# Patient Record
Sex: Female | Born: 1965
Health system: Southern US, Community
[De-identification: ages and names within clinical notes are randomized; demographics above are authoritative.]

## PROBLEM LIST (undated history)

## (undated) DIAGNOSIS — R42 Dizziness and giddiness: Secondary | ICD-10-CM

## (undated) DIAGNOSIS — E785 Hyperlipidemia, unspecified: Secondary | ICD-10-CM

## (undated) DIAGNOSIS — J449 Chronic obstructive pulmonary disease, unspecified: Secondary | ICD-10-CM

## (undated) DIAGNOSIS — K219 Gastro-esophageal reflux disease without esophagitis: Secondary | ICD-10-CM

## (undated) DIAGNOSIS — I10 Essential (primary) hypertension: Secondary | ICD-10-CM

## (undated) HISTORY — PX: CHOLECYSTECTOMY: SHX55

## (undated) HISTORY — DX: Hyperlipidemia, unspecified: E78.5

## (undated) HISTORY — PX: TOTAL ABDOMINAL HYSTERECTOMY: SHX209

## (undated) NOTE — *Deleted (*Deleted)
Preventive Care 40-64 Years Old, Female Preventive care refers to visits with your health care provider and lifestyle choices that can promote health and wellness. This includes:  A yearly physical exam. This may also be called an annual well check.  Regular dental visits and eye exams.  Immunizations.  Screening for certain conditions.  Healthy lifestyle choices, such as eating a healthy diet, getting regular exercise, not using drugs or products that contain nicotine and tobacco, and limiting alcohol use. What can I expect for my preventive care visit? Physical exam Your health care provider will check your:  Height and weight. This may be used to calculate body mass index (BMI), which tells if you are at a healthy weight.  Heart rate and blood pressure.  Skin for abnormal spots. Counseling Your health care provider may ask you questions about your:  Alcohol, tobacco, and drug use.  Emotional well-being.  Home and relationship well-being.  Sexual activity.  Eating habits.  Work and work environment.  Method of birth control.  Menstrual cycle.  Pregnancy history. What immunizations do I need?  Influenza (flu) vaccine  This is recommended every year. Tetanus, diphtheria, and pertussis (Tdap) vaccine  You may need a Td booster every 10 years. Varicella (chickenpox) vaccine  You may need this if you have not been vaccinated. Zoster (shingles) vaccine  You may need this after age 60. Measles, mumps, and rubella (MMR) vaccine  You may need at least one dose of MMR if you were born in 1957 or later. You may also need a second dose. Pneumococcal conjugate (PCV13) vaccine  You may need this if you have certain conditions and were not previously vaccinated. Pneumococcal polysaccharide (PPSV23) vaccine  You may need one or two doses if you smoke cigarettes or if you have certain conditions. Meningococcal conjugate (MenACWY) vaccine  You may need this if you  have certain conditions. Hepatitis A vaccine  You may need this if you have certain conditions or if you travel or work in places where you may be exposed to hepatitis A. Hepatitis B vaccine  You may need this if you have certain conditions or if you travel or work in places where you may be exposed to hepatitis B. Haemophilus influenzae type b (Hib) vaccine  You may need this if you have certain conditions. Human papillomavirus (HPV) vaccine  If recommended by your health care provider, you may need three doses over 6 months. You may receive vaccines as individual doses or as more than one vaccine together in one shot (combination vaccines). Talk with your health care provider about the risks and benefits of combination vaccines. What tests do I need? Blood tests  Lipid and cholesterol levels. These may be checked every 5 years, or more frequently if you are over 50 years old.  Hepatitis C test.  Hepatitis B test. Screening  Lung cancer screening. You may have this screening every year starting at age 55 if you have a 30-pack-year history of smoking and currently smoke or have quit within the past 15 years.  Colorectal cancer screening. All adults should have this screening starting at age 50 and continuing until age 75. Your health care provider may recommend screening at age 45 if you are at increased risk. You will have tests every 1-10 years, depending on your results and the type of screening test.  Diabetes screening. This is done by checking your blood sugar (glucose) after you have not eaten for a while (fasting). You may have this   done every 1-3 years.  Mammogram. This may be done every 1-2 years. Talk with your health care provider about when you should start having regular mammograms. This may depend on whether you have a family history of breast cancer.  BRCA-related cancer screening. This may be done if you have a family history of breast, ovarian, tubal, or peritoneal  cancers.  Pelvic exam and Pap test. This may be done every 3 years starting at age 21. Starting at age 30, this may be done every 5 years if you have a Pap test in combination with an HPV test. Other tests  Sexually transmitted disease (STD) testing.  Bone density scan. This is done to screen for osteoporosis. You may have this scan if you are at high risk for osteoporosis. Follow these instructions at home: Eating and drinking  Eat a diet that includes fresh fruits and vegetables, whole grains, lean protein, and low-fat dairy.  Take vitamin and mineral supplements as recommended by your health care provider.  Do not drink alcohol if: ? Your health care provider tells you not to drink. ? You are pregnant, may be pregnant, or are planning to become pregnant.  If you drink alcohol: ? Limit how much you have to 0-1 drink a day. ? Be aware of how much alcohol is in your drink. In the [place] one drink equals one 12 oz bottle of beer (355 mL), one 5 oz glass of wine (148 mL), or one 1 oz glass of hard liquor (44 mL). Lifestyle  Take daily care of your teeth and gums.  Stay active. Exercise for at least 30 minutes on 5 or more days each week.  Do not use any products that contain nicotine or tobacco, such as cigarettes, e-cigarettes, and chewing tobacco. If you need help quitting, ask your health care provider.  If you are sexually active, practice safe sex. Use a condom or other form of birth control (contraception) in order to prevent pregnancy and STIs (sexually transmitted infections).  If told by your health care provider, take low-dose aspirin daily starting at age 50. What's next?  Visit your health care provider once a year for a well check visit.  Ask your health care provider how often you should have your eyes and teeth checked.  Stay up to date on all vaccines. This information is not intended to replace advice given to you by your health care provider. Make sure you  discuss any questions you have with your health care provider. Document Revised: 07/24/2018 Document Reviewed: 07/24/2018 Elsevier Patient Education  2020 Elsevier Inc.  

## (undated) NOTE — *Deleted (*Deleted)
Name: Joan Davis   MRN: [id number]    DOB: Jun 26, 1966   Date:09/12/2020       Progress Note  Subjective  Chief Complaint  Annual Physical Exam  HPI  Patient presents for annual CPE.  Diet: *** Exercise: ***     Office Visit from 03/31/2020 in [facility]  AUDIT-C Score 0     Depression: Phq 9 is  {Desc; negative/positive:13464::"negative"} Depression screen [facility] 2/9 03/31/2020 03/09/2020 11/11/2019 09/28/2019 09/21/2019  Decreased Interest 3 2 0 0 0  Down, Depressed, Hopeless 1 0 0 0 0  PHQ - 2 Score 4 2 0 0 0  Altered sleeping 1 1 0 0 0  Tired, decreased energy 3 2 0 0 0  Change in appetite 2 0 0 0 0  Feeling bad or failure about yourself  0 0 0 0 0  Trouble concentrating 2 1 0 0 0  Moving slowly or fidgety/restless 0 0 0 0 0  Suicidal thoughts 0 0 0 0 0  PHQ-9 Score 12 6 0 0 0  Difficult doing work/chores Somewhat difficult Not difficult at all Not difficult at all Not difficult at all Not difficult at all  Some recent data might be hidden   Hypertension: BP Readings from Last 3 Encounters:  05/11/20 111/72  03/31/20 120/82  03/09/20 128/82   Obesity: Wt Readings from Last 3 Encounters:  05/11/20 269 lb 6 oz (122.2 kg)  03/31/20 259 lb 6.4 oz (117.7 kg)  03/09/20 257 lb 3.2 oz (116.7 kg)   BMI Readings from Last 3 Encounters:  05/11/20 40.36 kg/m  03/31/20 39.44 kg/m  03/09/20 38.54 kg/m     Vaccines:  HPV: up to at age 29 , ask insurance if age between 6-45  Shingrix: 76-64 yo and ask insurance if covered when patient above 69 yo Pneumonia: *** educated and discussed with patient. Flu: N/A, educated and discussed with patient.  Hep C Screening: Ordered at today's visit  STD testing and prevention (HIV/chl/gon/syphilis): *** Intimate partner violence:*** Sexual History : Menstrual History/LMP/Abnormal Bleeding:  Incontinence Symptoms:   Breast cancer:  - Last Mammogram: 12/25/2011 - BRCA gene screening: N/A  Osteoporosis:  Discussed high calcium and vitamin D supplementation, weight bearing exercises  Cervical cancer screening: N/A  Skin cancer: Discussed monitoring for atypical lesions  Colorectal cancer: 03/15/2020   Lung cancer:  *** Low Dose CT Chest recommended if Age 52-80 years, 20 pack-year currently smoking OR have quit w/in 15years. Patient does not qualify.   ECG: 05/11/2020  Advanced Care Planning: A voluntary discussion about advance care planning including the explanation and discussion of advance directives.  Discussed health care proxy and Living will, and the patient was able to identify a health care proxy as ***.  Patient does not have a living will at present time. If patient does have living will, I have requested they bring this to the clinic to be scanned in to their chart.  Lipids: Lab Results  Component Value Date   CHOL 202 (H) 03/09/2020   CHOL 198 01/14/2019   CHOL 200 (H) 07/25/2018   Lab Results  Component Value Date   HDL 44 (L) 03/09/2020   HDL 56 01/14/2019   HDL 49 (L) 07/25/2018   Lab Results  Component Value Date   LDLCALC 136 (H) 03/09/2020   LDLCALC 117 (H) 01/14/2019   LDLCALC 130 (H) 07/25/2018   Lab Results  Component Value Date   TRIG 112 03/09/2020   TRIG 140 01/14/2019  TRIG 99 07/25/2018   Lab Results  Component Value Date   CHOLHDL 4.6 03/09/2020   CHOLHDL 3.5 01/14/2019   CHOLHDL 4.1 07/25/2018   No results found for: LDLDIRECT  Glucose: Glucose  Date Value Ref Range Status  07/25/2013 102 (H) 65 - 99 mg/dL Final   Glucose, Bld  Date Value Ref Range Status  03/09/2020 86 65 - 99 mg/dL Final    Comment:    .            Fasting reference interval .   09/21/2019 98 70 - 99 mg/dL Final  16/08/9603 87 65 - 99 mg/dL Final    Comment:    .            Fasting reference interval .    Glucose-Capillary  Date Value Ref Range Status  08/19/2019 85 70 - 99 mg/dL Final  54/07/8118 95 65 - 99 mg/dL Final    Patient Active Problem  List   Diagnosis Date Noted  . Palpitations 05/13/2020  . Chest pain of uncertain etiology 05/13/2020  . H/O arthroscopy of right knee 01/14/2019  . Class 2 severe obesity due to excess calories with serious comorbidity and body mass index (BMI) of 36.0 to 36.9 in adult (HCC) 01/14/2019  . Morbid obesity (HCC) 01/14/2019  . Mood swings 01/14/2019  . Hot flushes, perimenopausal 01/14/2019  . Tobacco use disorder 01/14/2019  . Gastroesophageal reflux disease without esophagitis 01/14/2019  . Mixed hyperlipidemia 07/29/2018  . Dizziness 02/06/2018  . COPD (chronic obstructive pulmonary disease) (HCC) 12/20/2017  . Hypertension 12/20/2017  . Food impaction of esophagus     Past Surgical History:  Procedure Laterality Date  . CHOLECYSTECTOMY    . CHONDROPLASTY Right 12/09/2018   Procedure: CHONDROPLASTY LATERAL FEMORAL CONDYLE;  Surgeon: Juanell Fairly, MD;  Location: [facility] ORS;  Service: Orthopedics;  Laterality: Right;  . ESOPHAGOGASTRODUODENOSCOPY (EGD) WITH PROPOFOL N/A 08/06/2019   Procedure: ESOPHAGOGASTRODUODENOSCOPY (EGD) WITH PROPOFOL;  Surgeon: Wyline Mood, MD;  Location[facility] ENDOSCOPY;  Service: Gastroenterology;  Laterality: N/A;  . FOREIGN BODY REMOVAL N/A 07/15/2016   Procedure: FOREIGN BODY REMOVAL;  Surgeon: Midge Minium, MD;  Location: [facility] ENDOSCOPY;  Service: Endoscopy;  Laterality: N/A;  . KNEE ARTHROSCOPY Right 12/09/2018   Procedure: ARTHROSCOPY KNEE WITH PARTIAL LATERAL MENISCECTOMY;  Surgeon: Juanell Fairly, MD;  Location: [facility] ORS;  Service: Orthopedics;  Laterality: Right;  . TOTAL ABDOMINAL HYSTERECTOMY      Family History  Problem Relation Age of Onset  . Alcohol abuse Mother   . Heart disease Father 80  . Hypertension Father   . Hypertension Brother   . Hyperlipidemia Brother   . Heart disease Brother 34  . Asthma Maternal Grandmother   . Stroke Maternal Grandfather   . Hypertension Maternal Grandfather   . Congestive Heart Failure Paternal  Grandmother   . Heart disease Paternal Grandfather   . Congestive Heart Failure Paternal Grandfather     Social History   Socioeconomic History  . Marital status: Single    Spouse name: Not on file  . Number of children: 3  . Years of education: Not on file  . Highest education level: Associate degree: occupational, Scientist, product/process development, or vocational program  Occupational History  . Occupation: CNA  Tobacco Use  . Smoking status: Current Every Day Smoker    Packs/day: 0.50    Years: 31.00    Pack years: 15.50    Types: Cigarettes    Start date: 03/09/1988  . Smokeless tobacco: Never Used  Vaping Use  . Vaping Use: Never used  Substance and Sexual Activity  . Alcohol use: No  . Drug use: No  . Sexual activity: Yes  Other Topics Concern  . Not on file  Social History Narrative   Lives with father ( takes care of him) older son also at home    Works full time as Lawyer   Social Determinants of Corporate investment banker Strain:   . Difficulty of Paying Living Expenses: Not on file  Food Insecurity:   . Worried About Programme researcher, broadcasting/film/video in the Last Year: Not on file  . Ran Out of Food in the Last Year: Not on file  Transportation Needs:   . Lack of Transportation (Medical): Not on file  . Lack of Transportation (Non-Medical): Not on file  Physical Activity:   . Days of Exercise per Week: Not on file  . Minutes of Exercise per Session: Not on file  Stress:   . Feeling of Stress : Not on file  Social Connections:   . Frequency of Communication with Friends and Family: Not on file  . Frequency of Social Gatherings with Friends and Family: Not on file  . Attends Religious Services: Not on file  . Active Member of Clubs or Organizations: Not on file  . Attends Banker Meetings: Not on file  . Marital Status: Not on file  Intimate Partner Violence:   . Fear of Current or Ex-Partner: Not on file  . Emotionally Abused: Not on file  . Physically Abused: Not on file  .  Sexually Abused: Not on file     Current Outpatient Medications:  .  albuterol (VENTOLIN HFA) 108 (90 Base) MCG/ACT inhaler, Inhale 2 puffs into the lungs every 6 (six) hours as needed for wheezing or shortness of breath., Disp: 18 g, Rfl: 2 .  budesonide-formoterol (SYMBICORT) 160-4.5 MCG/ACT inhaler, Inhale 2 puffs into the lungs 2 (two) times daily., Disp: 1 Inhaler, Rfl: 2 .  Cyanocobalamin (VITAMIN B-12 PO), Take by mouth daily., Disp: , Rfl:  .  fluticasone (FLONASE) 50 MCG/ACT nasal spray, Place 2 sprays into both nostrils daily., Disp: 16 g, Rfl: 6 .  hydrochlorothiazide (HYDRODIURIL) 25 MG tablet, Take 25 mg by mouth daily., Disp: , Rfl:  .  loratadine (CLARITIN) 10 MG tablet, Take 10 mg by mouth daily., Disp: , Rfl:  .  meclizine (ANTIVERT) 25 MG tablet, Take 0.5-1 tablets (12.5-25 mg total) by mouth 2 (two) times daily as needed for dizziness., Disp: 100 tablet, Rfl: 0 .  Multiple Vitamin (MULTIVITAMIN) capsule, Take 1 capsule by mouth daily., Disp: , Rfl:  .  omeprazole (PRILOSEC) 40 MG capsule, Take 1 capsule (40 mg total) by mouth daily., Disp: 90 capsule, Rfl: 1 .  rosuvastatin (CRESTOR) 10 MG tablet, Take 1 tablet (10 mg total) by mouth daily., Disp: 90 tablet, Rfl: 1 .  triamterene-hydrochlorothiazide (MAXZIDE-25) 37.5-25 MG tablet, Take 1 tablet by mouth daily., Disp: 90 tablet, Rfl: 0 .  VITAMIN D PO, Take by mouth daily., Disp: , Rfl:   No Known Allergies   ROS  ***  Objective  There were no vitals filed for this visit.  There is no height or weight on file to calculate BMI.  Physical Exam ***  Recent Results (from the past 2160 hour(s))  ECHOCARDIOGRAM COMPLETE     Status: None   Collection Time: 07/18/20  2:54 PM  Result Value Ref Range   S' Lateral 3.90 cm  Area-P 1/2 1.60 cm2    Diabetic Foot Exam: Diabetic Foot Exam - Simple   No data filed     ***  Fall Risk: Fall Risk  03/31/2020 03/09/2020 11/11/2019 09/28/2019 09/21/2019  Falls in the past  year? 0 0 0 0 0  Number falls in past yr: 0 0 0 0 0  Injury with Fall? 0 0 0 0 0  Risk for fall due to : - - - - -  Follow up - - - Falls evaluation completed Falls evaluation completed   ***  Functional Status Survey:   ***  Assessment & Plan  There are no diagnoses linked to this encounter.  -USPSTF grade A and B recommendations reviewed with patient; age-appropriate recommendations, preventive care, screening tests, etc discussed and encouraged; healthy living encouraged; see AVS for patient education given to patient -Discussed importance of 150 minutes of physical activity weekly, eat two servings of fish weekly, eat one serving of tree nuts ( cashews, pistachios, pecans, almonds.Marland Kitchen) every other day, eat 6 servings of fruit/vegetables daily and drink plenty of water and avoid sweet beverages.   -Reviewed Health Maintenance: ***

---

## 2006-07-06 ENCOUNTER — Emergency Department: Payer: Self-pay | Admitting: Emergency Medicine

## 2006-07-08 ENCOUNTER — Emergency Department: Payer: Self-pay | Admitting: Emergency Medicine

## 2006-09-14 ENCOUNTER — Emergency Department: Payer: Self-pay | Admitting: Emergency Medicine

## 2007-03-16 ENCOUNTER — Emergency Department: Payer: Self-pay | Admitting: Emergency Medicine

## 2007-08-01 ENCOUNTER — Emergency Department: Payer: Self-pay | Admitting: Emergency Medicine

## 2007-09-01 ENCOUNTER — Emergency Department: Payer: Self-pay | Admitting: Emergency Medicine

## 2007-11-15 ENCOUNTER — Emergency Department: Payer: Self-pay | Admitting: Unknown Physician Specialty

## 2007-12-03 ENCOUNTER — Encounter: Payer: Self-pay | Admitting: Family Medicine

## 2007-12-28 ENCOUNTER — Encounter: Payer: Self-pay | Admitting: Family Medicine

## 2008-03-01 ENCOUNTER — Ambulatory Visit: Payer: Self-pay | Admitting: Orthopedic Surgery

## 2008-12-27 ENCOUNTER — Emergency Department: Payer: Self-pay | Admitting: Emergency Medicine

## 2011-02-07 ENCOUNTER — Emergency Department: Payer: Self-pay | Admitting: Emergency Medicine

## 2011-06-17 ENCOUNTER — Emergency Department: Payer: Self-pay | Admitting: Emergency Medicine

## 2011-06-24 ENCOUNTER — Emergency Department: Payer: Self-pay | Admitting: Emergency Medicine

## 2011-12-25 ENCOUNTER — Ambulatory Visit: Payer: Self-pay

## 2012-05-25 ENCOUNTER — Emergency Department: Payer: Self-pay | Admitting: Internal Medicine

## 2013-07-25 ENCOUNTER — Emergency Department: Payer: Self-pay | Admitting: Emergency Medicine

## 2013-07-25 LAB — CBC
HCT: 38 % (ref 35.0–47.0)
HGB: 12.9 g/dL (ref 12.0–16.0)
MCHC: 33.8 g/dL (ref 32.0–36.0)
MCV: 94 fL (ref 80–100)
Platelet: 328 10*3/uL (ref 150–440)
RBC: 4.07 10*6/uL (ref 3.80–5.20)
RDW: 13.8 % (ref 11.5–14.5)

## 2013-07-25 LAB — BASIC METABOLIC PANEL
Anion Gap: 6 — ABNORMAL LOW (ref 7–16)
Chloride: 109 mmol/L — ABNORMAL HIGH (ref 98–107)
EGFR (African American): 53 — ABNORMAL LOW
EGFR (Non-African Amer.): 46 — ABNORMAL LOW
Osmolality: 283 (ref 275–301)

## 2013-07-25 LAB — TROPONIN I: Troponin-I: <0.02 ng/mL

## 2013-07-25 LAB — CK TOTAL AND CKMB (NOT AT ARMC): CK-MB: 0.6 ng/mL (ref 0.5–3.6)

## 2016-01-28 ENCOUNTER — Emergency Department: Payer: No Typology Code available for payment source

## 2016-01-28 DIAGNOSIS — S8001XA Contusion of right knee, initial encounter: Secondary | ICD-10-CM | POA: Diagnosis not present

## 2016-01-28 DIAGNOSIS — Y9389 Activity, other specified: Secondary | ICD-10-CM | POA: Insufficient documentation

## 2016-01-28 DIAGNOSIS — Y92410 Unspecified street and highway as the place of occurrence of the external cause: Secondary | ICD-10-CM | POA: Insufficient documentation

## 2016-01-28 DIAGNOSIS — I10 Essential (primary) hypertension: Secondary | ICD-10-CM | POA: Diagnosis not present

## 2016-01-28 DIAGNOSIS — Y998 Other external cause status: Secondary | ICD-10-CM | POA: Diagnosis not present

## 2016-01-28 DIAGNOSIS — S8991XA Unspecified injury of right lower leg, initial encounter: Secondary | ICD-10-CM | POA: Diagnosis present

## 2016-01-28 DIAGNOSIS — Z79899 Other long term (current) drug therapy: Secondary | ICD-10-CM | POA: Insufficient documentation

## 2016-01-28 DIAGNOSIS — F17210 Nicotine dependence, cigarettes, uncomplicated: Secondary | ICD-10-CM | POA: Insufficient documentation
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
## 2016-01-29 ENCOUNTER — Emergency Department
Admission: EM | Admit: 2016-01-29 | Discharge: 2016-01-29 | Disposition: A | Discharge status: Home / Self Care | Payer: No Typology Code available for payment source | Attending: Emergency Medicine | Admitting: Emergency Medicine

## 2016-01-29 ENCOUNTER — Encounter: Payer: Self-pay | Admitting: Emergency Medicine

## 2016-01-29 DIAGNOSIS — S8001XA Contusion of right knee, initial encounter: Secondary | ICD-10-CM

## 2016-01-29 HISTORY — DX: Essential (primary) hypertension: I10

## 2016-01-29 MED ORDER — DOCUSATE SODIUM 100 MG PO CAPS: ORAL_CAPSULE | ORAL | Status: DC

## 2016-01-29 MED ORDER — HYDROCODONE-ACETAMINOPHEN 5-325 MG PO TABS
2.0000 | ORAL_TABLET | Freq: Once | ORAL | Status: AC
  Administered 2016-01-29: 2 via ORAL
  Filled 2016-01-29: qty 2

## 2016-01-29 MED ORDER — HYDROCODONE-ACETAMINOPHEN 5-325 MG PO TABS: 1.0000 | ORAL_TABLET | ORAL | Status: DC | PRN
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
## 2016-01-29 NOTE — ED Notes (Signed)
Dr. Forbach at bedside.  
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
## 2016-07-15 ENCOUNTER — Emergency Department: Payer: Self-pay

## 2016-07-15 ENCOUNTER — Encounter: Payer: Self-pay | Admitting: Emergency Medicine

## 2016-07-15 ENCOUNTER — Emergency Department: Payer: Self-pay | Admitting: Anesthesiology

## 2016-07-15 ENCOUNTER — Encounter
Admission: EM | Disposition: A | Discharge status: Home / Self Care | Payer: Self-pay | Source: Home / Self Care | Attending: Emergency Medicine

## 2016-07-15 ENCOUNTER — Emergency Department
Admission: EM | Admit: 2016-07-15 | Discharge: 2016-07-15 | Disposition: A | Discharge status: Home / Self Care | Payer: Self-pay | Attending: Emergency Medicine | Admitting: Emergency Medicine

## 2016-07-15 DIAGNOSIS — X58XXXA Exposure to other specified factors, initial encounter: Secondary | ICD-10-CM | POA: Insufficient documentation

## 2016-07-15 DIAGNOSIS — K222 Esophageal obstruction: Secondary | ICD-10-CM

## 2016-07-15 DIAGNOSIS — J449 Chronic obstructive pulmonary disease, unspecified: Secondary | ICD-10-CM | POA: Insufficient documentation

## 2016-07-15 DIAGNOSIS — T18128A Food in esophagus causing other injury, initial encounter: Secondary | ICD-10-CM

## 2016-07-15 DIAGNOSIS — F17210 Nicotine dependence, cigarettes, uncomplicated: Secondary | ICD-10-CM | POA: Insufficient documentation

## 2016-07-15 DIAGNOSIS — Z79891 Long term (current) use of opiate analgesic: Secondary | ICD-10-CM | POA: Insufficient documentation

## 2016-07-15 DIAGNOSIS — Z79899 Other long term (current) drug therapy: Secondary | ICD-10-CM | POA: Insufficient documentation

## 2016-07-15 DIAGNOSIS — I10 Essential (primary) hypertension: Secondary | ICD-10-CM | POA: Insufficient documentation

## 2016-07-15 DIAGNOSIS — W44F3XA Food entering into or through a natural orifice, initial encounter: Secondary | ICD-10-CM

## 2016-07-15 HISTORY — PX: FOREIGN BODY REMOVAL: SHX962

## 2016-07-15 HISTORY — DX: Chronic obstructive pulmonary disease, unspecified: J44.9

## 2016-07-15 LAB — BASIC METABOLIC PANEL
ANION GAP: 6 (ref 5–15)
BUN: 18 mg/dL (ref 6–20)
CALCIUM: 9.2 mg/dL (ref 8.9–10.3)
CO2: 26 mmol/L (ref 22–32)
Chloride: 113 mmol/L — ABNORMAL HIGH (ref 101–111)
Creatinine, Ser: 0.75 mg/dL (ref 0.44–1.00)
Glucose, Bld: 104 mg/dL — ABNORMAL HIGH (ref 65–99)
Potassium: 3.3 mmol/L — ABNORMAL LOW (ref 3.5–5.1)
SODIUM: 145 mmol/L (ref 135–145)

## 2016-07-15 LAB — CBC
HCT: 43.2 % (ref 35.0–47.0)
Hemoglobin: 14.6 g/dL (ref 12.0–16.0)
MCH: 32.1 pg (ref 26.0–34.0)
MCHC: 33.8 g/dL (ref 32.0–36.0)
MCV: 94.9 fL (ref 80.0–100.0)
PLATELETS: 271 10*3/uL (ref 150–440)
RBC: 4.55 MIL/uL (ref 3.80–5.20)
RDW: 14.1 % (ref 11.5–14.5)
WBC: 6.2 10*3/uL (ref 3.6–11.0)

## 2016-07-15 LAB — PROTIME-INR
INR: 0.96
PROTHROMBIN TIME: 12.8 s (ref 11.4–15.2)

## 2016-07-15 SURGERY — REMOVAL, FOREIGN BODY
Anesthesia: General

## 2016-07-15 MED ORDER — MIDAZOLAM HCL 2 MG/2ML IJ SOLN
INTRAMUSCULAR | Status: DC | PRN
  Administered 2016-07-15: 1 mg via INTRAVENOUS

## 2016-07-15 MED ORDER — FENTANYL CITRATE (PF) 100 MCG/2ML IJ SOLN: 25.0000 ug | INTRAMUSCULAR | Status: DC | PRN

## 2016-07-15 MED ORDER — MORPHINE SULFATE (PF) 4 MG/ML IV SOLN
4.0000 mg | Freq: Once | INTRAVENOUS | Status: AC
  Administered 2016-07-15: 4 mg via INTRAVENOUS
  Filled 2016-07-15: qty 1

## 2016-07-15 MED ORDER — PROPOFOL 10 MG/ML IV BOLUS
INTRAVENOUS | Status: DC | PRN
  Administered 2016-07-15: 150 mg via INTRAVENOUS

## 2016-07-15 MED ORDER — ONDANSETRON HCL 4 MG/2ML IJ SOLN: 4.0000 mg | Freq: Once | INTRAMUSCULAR | Status: DC | PRN

## 2016-07-15 MED ORDER — ONDANSETRON HCL 4 MG/2ML IJ SOLN
4.0000 mg | Freq: Once | INTRAMUSCULAR | Status: AC
  Administered 2016-07-15: 4 mg via INTRAVENOUS
  Filled 2016-07-15: qty 2

## 2016-07-15 MED ORDER — FENTANYL CITRATE (PF) 100 MCG/2ML IJ SOLN
INTRAMUSCULAR | Status: DC | PRN
Start: 2016-07-15 — End: 2016-07-15
  Administered 2016-07-15: 50 ug via INTRAVENOUS

## 2016-07-15 MED ORDER — SUCCINYLCHOLINE CHLORIDE 20 MG/ML IJ SOLN
INTRAMUSCULAR | Status: DC | PRN
  Administered 2016-07-15: 100 mg via INTRAVENOUS

## 2016-07-15 MED ORDER — GLUCAGON HCL (RDNA) 1 MG IJ SOLR
1.0000 mg | Freq: Once | INTRAMUSCULAR | Status: AC
  Administered 2016-07-15: 1 mg via INTRAVENOUS
  Filled 2016-07-15: qty 1

## 2016-07-15 MED ORDER — ONDANSETRON HCL 4 MG/2ML IJ SOLN
INTRAMUSCULAR | Status: AC
  Administered 2016-07-15: 4 mg via INTRAVENOUS
  Filled 2016-07-15: qty 2

## 2016-07-15 MED ORDER — LACTATED RINGERS IV SOLN
INTRAVENOUS | Status: DC | PRN
  Administered 2016-07-15: 19:00:00 via INTRAVENOUS

## 2016-07-15 MED ORDER — ONDANSETRON HCL 4 MG/2ML IJ SOLN
4.0000 mg | Freq: Once | INTRAMUSCULAR | Status: AC
  Administered 2016-07-15: 4 mg via INTRAVENOUS

## 2016-07-15 MED ORDER — IPRATROPIUM-ALBUTEROL 0.5-2.5 (3) MG/3ML IN SOLN
3.0000 mL | Freq: Once | RESPIRATORY_TRACT | Status: AC
  Administered 2016-07-15: 3 mL via RESPIRATORY_TRACT

## 2016-07-15 NOTE — Brief Op Note (Signed)
Pt intubated successfully by J.Rise Patience, CRNA

## 2016-07-15 NOTE — Anesthesia Postprocedure Evaluation (Signed)
Anesthesia Post Note  Patient: Joan Davis  Procedure(s) Performed: Procedure(s) (LRB): FOREIGN BODY REMOVAL (N/A)  Patient location during evaluation: Other Anesthesia Type: General Level of consciousness: awake and alert Pain management: pain level controlled Vital Signs Assessment: post-procedure vital signs reviewed and stable Respiratory status: spontaneous breathing, nonlabored ventilation, respiratory function stable and patient connected to nasal cannula oxygen Cardiovascular status: blood pressure returned to baseline and stable Postop Assessment: no signs of nausea or vomiting Anesthetic complications: no    Last Vitals:  Vitals:   07/15/16 1927 07/15/16 1929  BP: 126/75 126/75  Pulse: 95   Resp: (!) 22 20  Temp: 36.4 C     Last Pain:  Vitals:   07/15/16 1605  TempSrc: Oral                 Sheyla Zaffino S

## 2016-07-15 NOTE — ED Triage Notes (Signed)
Patient brought in by [facility] from home, patient was eating steak when she felt a piece lodge in her throat, patient tried to drink water to get the steak to go down however she could not get it down. Patient unable to swallow her own spit at this time.

## 2016-07-15 NOTE — Anesthesia Preprocedure Evaluation (Signed)
Anesthesia Evaluation  Patient identified by MRN, date of birth, ID band Patient awake    Reviewed: Allergy & Precautions, NPO status , Patient's Chart, lab work & pertinent test results, reviewed documented beta blocker date and time   Airway Mallampati: III  TM Distance: >3 FB     Dental  (+) Chipped   Pulmonary COPD, Current Smoker,           Cardiovascular hypertension, Pt. on medications      Neuro/Psych    GI/Hepatic   Endo/Other    Renal/GU      Musculoskeletal   Abdominal   Peds  Hematology   Anesthesia Other Findings Obese.  Reproductive/Obstetrics                             Anesthesia Physical Anesthesia Plan  ASA: III  Anesthesia Plan: General   Post-op Pain Management:    Induction: Intravenous  Airway Management Planned: Oral ETT  Additional Equipment:   Intra-op Plan:   Post-operative Plan:   Informed Consent: I have reviewed the patients History and Physical, chart, labs and discussed the procedure including the risks, benefits and alternatives for the proposed anesthesia with the patient or authorized representative who has indicated his/her understanding and acceptance.     Plan Discussed with: CRNA  Anesthesia Plan Comments:         Anesthesia Quick Evaluation

## 2016-07-15 NOTE — H&P (Signed)
  Lucilla Lame, MD [facility] [address] [place], [place] [address] Phone: [phone] Fax : [phone]  Primary Care Physician:  [facility] Primary Gastroenterologist:  Dr. Allen Norris  Pre-Procedure History & Physical: HPI:  Joan Davis is a 50 y.o. female is here for an endoscopy.   Past Medical History:  Diagnosis Date  . COPD (chronic obstructive pulmonary disease) ([place])   . Hypertension     History reviewed. No pertinent surgical history.  Prior to Admission medications   Medication Sig Start Date End Date Taking? Authorizing Provider  docusate sodium (COLACE) 100 MG capsule Take 1 tablet once or twice daily as needed for constipation while taking narcotic pain medicine 01/29/16   Hinda Kehr, MD  hydrochlorothiazide (HYDRODIURIL) 25 MG tablet Take 25 mg by mouth daily.    Historical Provider, MD  HYDROcodone-acetaminophen (NORCO/VICODIN) 5-325 MG tablet Take 1-2 tablets by mouth every 4 (four) hours as needed for moderate pain. 01/29/16   Hinda Kehr, MD    Allergies as of 07/15/2016  . (No Known Allergies)    History reviewed. No pertinent family history.  Social History   Social History  . Marital status: Single    Spouse name: N/A  . Number of children: N/A  . Years of education: N/A   Occupational History  . Not on file.   Social History Main Topics  . Smoking status: Current Every Day Smoker    Packs/day: 0.50    Types: Cigarettes  . Smokeless tobacco: Never Used  . Alcohol use No  . Drug use: No  . Sexual activity: Not on file   Other Topics Concern  . Not on file   Social History Narrative  . No narrative on file    Review of Systems: See HPI, otherwise negative ROS  Physical Exam: BP 131/71   Pulse 66   Temp 98.6 F (37 C) (Oral)   Resp 20   Wt 275 lb 4.8 oz (124.9 kg)   SpO2 100%   BMI 38.40 kg/m  General:   Alert,  pleasant and cooperative in NAD Head:  Normocephalic and atraumatic. Neck:  Supple; no  masses or thyromegaly. Lungs:  Clear throughout to auscultation.    Heart:  Regular rate and rhythm. Abdomen:  Soft, nontender and nondistended. Normal bowel sounds, without guarding, and without rebound.   Neurologic:  Alert and  oriented x4;  grossly normal neurologically.  Impression/Plan: Joan Davis is here for an endoscopy to be performed for food bolus    Risks, benefits, limitations, and alternatives regarding  endoscopy have been reviewed with the patient.  Questions have been answered.  All parties agreeable.   Lucilla Lame, MD  07/15/2016, 6:59 PM

## 2016-07-15 NOTE — Anesthesia Procedure Notes (Signed)
Procedure Name: Intubation Date/Time: 07/15/2016 7:13 PM Performed by: Jonna Clark Pre-anesthesia Checklist: Patient identified, Patient being monitored, Timeout performed, Emergency Drugs available and Suction available Patient Re-evaluated:Patient Re-evaluated prior to inductionOxygen Delivery Method: Circle system utilized Preoxygenation: Pre-oxygenation with 100% oxygen Intubation Type: IV induction and Rapid sequence Ventilation: Mask ventilation without difficulty Laryngoscope Size: Mac and 3 Grade View: Grade I Tube type: Oral Tube size: 7.0 mm Number of attempts: 1 Placement Confirmation: ETT inserted through vocal cords under direct vision,  positive ETCO2 and breath sounds checked- equal and bilateral Secured at: 21 cm Tube secured with: Tape Dental Injury: Teeth and Oropharynx as per pre-operative assessment

## 2016-07-15 NOTE — ED Provider Notes (Signed)
[facility] Emergency Department Provider Note  ____________________________________________  I have reviewed the triage vital signs and the nursing notes.   HISTORY  Chief Complaint Choking   HPI Joan Davis is a 50 y.o. female presents after "choking on steak".  Patient was eating a piece of steak about 30 minutes ago when she felt that gets stuck. She is having a sensation described as a stretching or nauseated feeling but not really "pain" which she is experiencing in the middle of her neck pointing to approximately the area of her larynx.  Denies any previous issues with this. Reports she is otherwise healthy. No allergies to any medications. She reports she is not able to keep any saliva down and she is having to spit everything up. She denies any trouble breathing, but reports when it first happened she felt like she was choking and having difficulty breathing but this is no longer the case.Does have a history of COPD but denies any issues with that. No chest pain.  Past Medical History:  Diagnosis Date  . COPD (chronic obstructive pulmonary disease) ([place])   . Hypertension     Patient Active Problem List   Diagnosis Date Noted  . Food impaction of esophagus     History reviewed. No pertinent surgical history.  Prior to Admission medications   Medication Sig Start Date End Date Taking? Authorizing Provider  docusate sodium (COLACE) 100 MG capsule Take 1 tablet once or twice daily as needed for constipation while taking narcotic pain medicine 01/29/16   Hinda Kehr, MD  hydrochlorothiazide (HYDRODIURIL) 25 MG tablet Take 25 mg by mouth daily.    Historical Provider, MD  HYDROcodone-acetaminophen (NORCO/VICODIN) 5-325 MG tablet Take 1-2 tablets by mouth every 4 (four) hours as needed for moderate pain. 01/29/16   Hinda Kehr, MD    Allergies Review of patient's allergies indicates no known allergies.  History reviewed. No pertinent family  history.  Social History Social History  Substance Use Topics  . Smoking status: Current Every Day Smoker    Packs/day: 0.50    Types: Cigarettes  . Smokeless tobacco: Never Used  . Alcohol use No    Review of Systems Constitutional: No fever/chills. Eyes: No visual changes. ENT: See history of present illness Cardiovascular: Denies chest pain. Respiratory: Denies shortness of breath. Gastrointestinal: No abdominal pain.  No diarrhea.  No constipation. Genitourinary: Negative for dysuria. Musculoskeletal: Negative for back pain. Skin: Negative for rash. Neurological: Negative for headaches, focal weakness or numbness.  10-point ROS otherwise negative.  ____________________________________________   PHYSICAL EXAM:  VITAL SIGNS: ED Triage Vitals [07/15/16 1605]  Enc Vitals Group     BP 135/76     Pulse Rate 76     Resp 20     Temp 98.6 F (37 C)     Temp Source Oral     SpO2 98 %     Weight 275 lb 4.8 oz (124.9 kg)     Height      Head Circumference      Peak Flow      Pain Score      Pain Loc      Pain Edu?      Excl. in [place]?     Constitutional: Alert and oriented. Well appearing and in no acute distress.Does appear uncomfortable, spitting up secretions into an emesis bag. Airway totally intact with normal voice and speech. Eyes: Conjunctivae are normal. PERRL. EOMI. Head: Atraumatic. Nose: No congestion/rhinnorhea. Mouth/Throat: Mucous membranes  are moist.  Oropharynx non-erythematous. No visualizable foreign body. Neck: No stridor.   Cardiovascular: Normal rate, regular rhythm. Grossly normal heart sounds.  Good peripheral circulation. Respiratory: Normal respiratory effort.  No retractions. Lungs CTAB. Gastrointestinal: Soft and nontender. No distention.  Musculoskeletal: No lower extremity tenderness nor edema.  No joint effusions. Neurologic:  Normal speech and language. No gross focal neurologic deficits are appreciated. Skin:  Skin is warm, dry and  intact. No rash noted. Psychiatric: Mood and affect are normal. Speech and behavior are normal.  ____________________________________________   LABS (all labs ordered are listed, but only abnormal results are displayed)  Labs Reviewed  BASIC METABOLIC PANEL - Abnormal; Notable for the following:       Result Value   Potassium 3.3 (*)    Chloride 113 (*)    Glucose, Bld 104 (*)    All other components within normal limits  CBC  PROTIME-INR   ____________________________________________  EKG   ____________________________________________  RADIOLOGY  Dg Neck Soft Tissue  Result Date: 07/15/2016 CLINICAL DATA:  Meet stuck in throat. EXAM: NECK SOFT TISSUES - 1+ VIEW COMPARISON:  None. FINDINGS: No abnormal soft tissue shadows or gas shadows. Ordinary spondylosis C5-6 and C6-7. IMPRESSION: No significant plain radiographic finding. Electronically Signed   By: Nelson Chimes M.D.   On: 07/15/2016 16:58   Dg Chest 2 View  Result Date: 07/15/2016 CLINICAL DATA:  Meet stuck in throat. EXAM: CHEST  2 VIEW COMPARISON:  07/25/2013 FINDINGS: Heart size is normal. Mediastinal shadows are normal. Slightly chronic interstitial lung markings again demonstrated without evidence of active infiltrate, mass, effusion or collapse. No significant bone finding. IMPRESSION: No acute finding relating to the clinical presentation. Chronic interstitial lung markings. Electronically Signed   By: Nelson Chimes M.D.   On: 07/15/2016 16:59    ____________________________________________   PROCEDURES  Procedure(s) performed: None  Procedures  Critical Care performed: No  ____________________________________________   INITIAL IMPRESSION / ASSESSMENT AND PLAN / ED COURSE  Pertinent labs & imaging results that were available during my care of the patient were reviewed by me and considered in my medical decision making (see chart for details).  Patient presents with globus sensation, spitting out  saliva. No evidence of obvious airway involvement however concern that she may have an esophageal foreign body given she is unable to swallow saliva.  No cardiac or pulmonary symptoms.  Clinical Course  Comment By Time  Patient resting, reports ongoing discomfort in the neck area, still unable to swallow secretions. We'll attempt medical management with glucagon and morphine now. If no improvement thereafter patient will require GI consultation. Delman Kitten, MD 08/20 [phone]  Patient still unable to pass. Discussed with gastroenterology, coming to see patient in consult now. Delman Kitten, MD 08/20 1758     ____________________________________________   FINAL CLINICAL IMPRESSION(S) / ED DIAGNOSES  Final diagnoses:  Esophageal obstruction due to food impaction      NEW MEDICATIONS STARTED DURING THIS VISIT:  Current Discharge Medication List       Note:  This document was prepared using Dragon voice recognition software and may include unintentional dictation errors.     Delman Kitten, MD 07/15/16 [phone]

## 2016-07-15 NOTE — Transfer of Care (Signed)
Immediate Anesthesia Transfer of Care Note  Patient: MEGAHN SHORTY  Procedure(s) Performed: Procedure(s): FOREIGN BODY REMOVAL (N/A)  Patient Location: PACU  Anesthesia Type:General  Level of Consciousness: awake, alert  and oriented  Airway & Oxygen Therapy: Patient Spontanous Breathing and Patient connected to nasal cannula oxygen  Post-op Assessment: Report given to RN and Post -op Vital signs reviewed and stable  Post vital signs: Reviewed and stable  Last Vitals:  Vitals:   07/15/16 1927 07/15/16 1929  BP: 126/75 126/75  Pulse: 95   Resp: (!) 22 20  Temp: 36.4 C     Last Pain:  Vitals:   07/15/16 1605  TempSrc: Oral         Complications: No apparent anesthesia complications

## 2016-07-16 ENCOUNTER — Encounter: Payer: Self-pay | Admitting: Gastroenterology

## 2016-07-16 NOTE — Op Note (Signed)
[facility] Gastroenterology Patient Name: Joan Davis Procedure Date: 07/15/2016 7:00 PM MRN: [id number] Account #: [id number] Date of Birth: 03-07-1966 Admit Type: Outpatient Age: 50 Room: [facility] ENDO ROOM 4 Gender: Female Note Status: Finalized Procedure:            Upper GI endoscopy Indications:          Foreign body in the esophagus Providers:            Lucilla Lame MD, MD Referring MD:         No Local Md, MD (Referring MD) Medicines:            General Anesthesia Complications:        No immediate complications. Procedure:            Pre-Anesthesia Assessment:                       - Prior to the procedure, a History and Physical was                        performed, and patient medications and allergies were                        reviewed. The patient's tolerance of previous                        anesthesia was also reviewed. The risks and benefits of                        the procedure and the sedation options and risks were                        discussed with the patient. All questions were                        answered, and informed consent was obtained. Prior                        Anticoagulants: The patient has taken no previous                        anticoagulant or antiplatelet agents. ASA Grade                        Assessment: II - A patient with mild systemic disease.                        After reviewing the risks and benefits, the patient was                        deemed in satisfactory condition to undergo the                        procedure.                       After obtaining informed consent, the endoscope was                        passed under direct vision. Throughout the procedure,  the patient's blood pressure, pulse, and oxygen                        saturations were monitored continuously. The Endoscope                        was introduced through the mouth, and advanced to the             second part of duodenum. The upper GI endoscopy was                        accomplished without difficulty. The patient tolerated                        the procedure well. Findings:      Food was found in the lower third of the esophagus. Removal of food was       accomplished.      Food (residue) was found in the entire examined stomach.      The examined duodenum was normal. Impression:           - Food in the lower third of the esophagus. Removal was                        successful.                       - Food (residue) in the stomach.                       - Normal examined duodenum. Recommendation:       - Repeat upper endoscopy in 6 weeks. Procedure Code(s):    --- Professional ---                       [phone], Esophagogastroduodenoscopy, flexible, transoral;                        with removal of foreign body(s) Diagnosis Code(s):    --- Professional ---                       [id number], Food in esophagus causing other injury,                        initial encounter                       T18.108A, Unspecified foreign body in esophagus causing                        other injury, initial encounter CPT copyright 2016 American Medical Association. All rights reserved. The codes documented in this report are preliminary and upon coder review may  be revised to meet current compliance requirements. Lucilla Lame MD, MD 07/15/2016 7:19:12 PM This report has been signed electronically. Number of Addenda: 0 Note Initiated On: 07/15/2016 7:00 PM      [facility]

## 2017-08-16 ENCOUNTER — Encounter: Payer: Self-pay | Admitting: Emergency Medicine

## 2017-08-16 ENCOUNTER — Emergency Department
Admission: EM | Admit: 2017-08-16 | Discharge: 2017-08-16 | Disposition: A | Discharge status: Home / Self Care | Payer: Self-pay | Attending: Emergency Medicine | Admitting: Emergency Medicine

## 2017-08-16 DIAGNOSIS — Z79899 Other long term (current) drug therapy: Secondary | ICD-10-CM | POA: Insufficient documentation

## 2017-08-16 DIAGNOSIS — F17210 Nicotine dependence, cigarettes, uncomplicated: Secondary | ICD-10-CM | POA: Insufficient documentation

## 2017-08-16 DIAGNOSIS — J449 Chronic obstructive pulmonary disease, unspecified: Secondary | ICD-10-CM | POA: Insufficient documentation

## 2017-08-16 DIAGNOSIS — L918 Other hypertrophic disorders of the skin: Secondary | ICD-10-CM | POA: Insufficient documentation

## 2017-08-16 DIAGNOSIS — I10 Essential (primary) hypertension: Secondary | ICD-10-CM | POA: Insufficient documentation

## 2017-08-16 MED ORDER — CEPHALEXIN 500 MG PO CAPS: 500.0000 mg | ORAL_CAPSULE | Freq: Three times a day (TID) | ORAL | 0 refills | Status: DC

## 2017-08-16 MED ORDER — LIDOCAINE-EPINEPHRINE 2 %-1:100000 IJ SOLN
1.7000 mL | Freq: Once | INTRAMUSCULAR | Status: AC
  Administered 2017-08-16: 1.7 mL
  Filled 2017-08-16: qty 1.7

## 2017-08-16 NOTE — ED Triage Notes (Signed)
Has skin tag.  Her np said to tie a string around it.  Now it looks infected.  It is in groin area

## 2017-08-16 NOTE — ED Notes (Signed)
States she developed a skin tag  Near her vaginal area   Was seen by her PCP  But thinks area in now infected

## 2017-08-16 NOTE — Discharge Instructions (Signed)
You have a large skin tag on the inner thigh. You may continue with your plan to strangulate the tag with ligation (string). Keep the area clean, dry, and covered. It may take a few weeks for the skin tag to dry up and fall off. Follow-up with your provider for interim wound checks. Return to the ED for any signs of infection. You may start the antibiotic if signs of infection occur.

## 2017-08-18 NOTE — ED Provider Notes (Signed)
[facility] Emergency Department Provider Note ____________________________________________  Time seen: 1048  I have reviewed the triage vital signs and the nursing notes.  HISTORY  Chief Complaint  Cellulitis  HPI Joan Davis is a 51 y.o. female Results to the ED for evaluation of a skin tag she has to her thigh, that she believes may be infected. Patient describes her nurse practitioner at her clinic told her that she could safely limited to skin tag by tiny piece of string around. The patient did so about 2 days prior. Since that time she noted some dark spontaneous bleeding to the skin tag on yesterday. Today she describes the skin tag looking somewhat infected. She denies any fevers, chills, sweats. She does note some local tenderness to the skin tag.  Past Medical History:  Diagnosis Date  . COPD (chronic obstructive pulmonary disease) ([place])   . Hypertension     Patient Active Problem List   Diagnosis Date Noted  . Food impaction of esophagus     Past Surgical History:  Procedure Laterality Date  . FOREIGN BODY REMOVAL N/A 07/15/2016   Procedure: FOREIGN BODY REMOVAL;  Surgeon: Lucilla Lame, MD;  Location: [facility] ENDOSCOPY;  Service: Endoscopy;  Laterality: N/A;    Prior to Admission medications   Medication Sig Start Date End Date Taking? Authorizing Provider  budesonide-formoterol (SYMBICORT) 160-4.5 MCG/ACT inhaler Inhale 2 puffs into the lungs 2 (two) times daily.   Yes [provider]  hydrochlorothiazide (HYDRODIURIL) 25 MG tablet Take 25 mg by mouth daily.   Yes [provider]  cephALEXin (KEFLEX) 500 MG capsule Take 1 capsule (500 mg total) by mouth 3 (three) times daily. 08/16/17   Demeisha Geraghty, Dannielle Karvonen, PA-C  docusate sodium (COLACE) 100 MG capsule Take 1 tablet once or twice daily as needed for constipation while taking narcotic pain medicine 01/29/16   Hinda Kehr, MD  hydrochlorothiazide (HYDRODIURIL) 25 MG tablet  Take 25 mg by mouth daily.    [provider]  HYDROcodone-acetaminophen (NORCO/VICODIN) 5-325 MG tablet Take 1-2 tablets by mouth every 4 (four) hours as needed for moderate pain. 01/29/16   Hinda Kehr, MD    Allergies Patient has no known allergies.  No family history on file.  Social History Social History  Substance Use Topics  . Smoking status: Current Every Day Smoker    Packs/day: 0.50    Types: Cigarettes  . Smokeless tobacco: Never Used  . Alcohol use No    Review of Systems  Constitutional: Negative for fever. Skin: Negative for rash. Right thigh skin tag Neurological: Negative for headaches, focal weakness or numbness. ____________________________________________  PHYSICAL EXAM:  VITAL SIGNS: ED Triage Vitals  Enc Vitals Group     BP 08/16/17 1028 (!) 143/79     Pulse Rate 08/16/17 1028 73     Resp 08/16/17 1028 18     Temp 08/16/17 1028 97.8 F (36.6 C)     Temp Source 08/16/17 1028 Oral     SpO2 08/16/17 1028 98 %     Weight 08/16/17 1028 269 lb (122 kg)     Height 08/16/17 1028 5\' 11"  (1.803 m)     Head Circumference --      Peak Flow --      Pain Score 08/16/17 1021 4     Pain Loc --      Pain Edu? --      Excl. in [place]? --     Constitutional: Alert and oriented. Well appearing  and in no distress. Head: Normocephalic and atraumatic. Cardiovascular: . Normal distal pulses. Respiratory: Normal respiratory effort.  Musculoskeletal: Nontender with normal range of motion in all extremities.  Neurologic:  Normal gait without ataxia. Normal speech and language. No gross focal neurologic deficits are appreciated. Skin:  Skin is warm, dry and intact. No rash noted. Patient with a large dilated skin tag noted to the inner thigh at the groin. There appears to be a white string catheter at the base of the skin tag. The skin tag itself is without erythema, induration, warmth, or swelling. No active bleeding is noted. The surrounding skin at the base is  without erythema, induration, warmth. ____________________________________________  PROCEDURES  Skin prepped with chlorhexidine [place] w/ epi 2%-1:100000 1 ml intralesional injection Dry dressing applied ____________________________________________  INITIAL IMPRESSION / ASSESSMENT AND PLAN / ED COURSE  Patient with ED evaluation of a skin tag after she cut a string around it. The patient is advised to her presentation is not consistent with an infectio at this time She is given a local intralesional injection of lidocaine for relief. She is also discharged with a prescription for Keflex, if signs of infection do develop. She is given instructions on signs of a local skin infection and is encouraged to follow with the primary provider at [facility] clinic.she is also encouraged to keep the skin tag covered to avoid traumatic avulsion. ____________________________________________  FINAL CLINICAL IMPRESSION(S) / ED DIAGNOSES  Final diagnoses:  Skin tag  Inflamed skin tag      Melvenia Needles, PA-C 08/18/17 0009    Carrie Mew, MD 08/18/17 1615

## 2017-12-18 ENCOUNTER — Encounter: Payer: Self-pay | Admitting: Intensive Care

## 2017-12-18 ENCOUNTER — Emergency Department
Admission: EM | Admit: 2017-12-18 | Discharge: 2017-12-18 | Disposition: A | Discharge status: Home / Self Care | Payer: Self-pay | Attending: Emergency Medicine | Admitting: Emergency Medicine

## 2017-12-18 ENCOUNTER — Emergency Department: Payer: Self-pay

## 2017-12-18 DIAGNOSIS — J449 Chronic obstructive pulmonary disease, unspecified: Secondary | ICD-10-CM | POA: Insufficient documentation

## 2017-12-18 DIAGNOSIS — M25562 Pain in left knee: Secondary | ICD-10-CM | POA: Insufficient documentation

## 2017-12-18 DIAGNOSIS — F17210 Nicotine dependence, cigarettes, uncomplicated: Secondary | ICD-10-CM | POA: Insufficient documentation

## 2017-12-18 DIAGNOSIS — I10 Essential (primary) hypertension: Secondary | ICD-10-CM | POA: Insufficient documentation

## 2017-12-18 DIAGNOSIS — Z79899 Other long term (current) drug therapy: Secondary | ICD-10-CM | POA: Insufficient documentation

## 2017-12-18 MED ORDER — MELOXICAM 15 MG PO TABS: 15.0000 mg | ORAL_TABLET | Freq: Every day | ORAL | 1 refills | Status: AC

## 2017-12-18 NOTE — ED Notes (Signed)
X-ray at bedside

## 2017-12-18 NOTE — ED Triage Notes (Signed)
Patient reports tripping and falling on a ramp X4 months ago. Never seen for knee pain. Reports about 2 weeks ago it started hurting and swelling up.

## 2017-12-18 NOTE — ED Provider Notes (Signed)
[facility] Emergency Department Provider Note  ____________________________________________  Time seen: Approximately 7:42 PM  I have reviewed the triage vital signs and the nursing notes.   HISTORY  Chief Complaint Knee Pain (left)    HPI Joan Davis is a 51 y.o. female presents to the emergency department with left knee pain.  Patient reports that she tripped approximately 4 months ago and experienced left knee pain.  Patient reports that her pain seemingly resolved but then returned approximately 2 weeks ago.  Patient describes 7 out of 10 left knee pain described as throbbing and "like a toothache".  Patient describes diffuse anterior knee pain worsened with ambulation and climbing stairs.  Patient denies recent travel, prolonged immobilization, recent surgery, shortness of breath and chest pain.   Past Medical History:  Diagnosis Date  . COPD (chronic obstructive pulmonary disease) ([place])   . Hypertension     Patient Active Problem List   Diagnosis Date Noted  . Food impaction of esophagus     Past Surgical History:  Procedure Laterality Date  . FOREIGN BODY REMOVAL N/A 07/15/2016   Procedure: FOREIGN BODY REMOVAL;  Surgeon: Lucilla Lame, MD;  Location: [facility] ENDOSCOPY;  Service: Endoscopy;  Laterality: N/A;    Prior to Admission medications   Medication Sig Start Date End Date Taking? Authorizing Provider  budesonide-formoterol (SYMBICORT) 160-4.5 MCG/ACT inhaler Inhale 2 puffs into the lungs 2 (two) times daily.    [provider]  cephALEXin (KEFLEX) 500 MG capsule Take 1 capsule (500 mg total) by mouth 3 (three) times daily. 08/16/17   Menshew, Dannielle Karvonen, PA-C  docusate sodium (COLACE) 100 MG capsule Take 1 tablet once or twice daily as needed for constipation while taking narcotic pain medicine 01/29/16   Hinda Kehr, MD  hydrochlorothiazide (HYDRODIURIL) 25 MG tablet Take 25 mg by mouth daily.    [provider]   hydrochlorothiazide (HYDRODIURIL) 25 MG tablet Take 25 mg by mouth daily.    [provider]  HYDROcodone-acetaminophen (NORCO/VICODIN) 5-325 MG tablet Take 1-2 tablets by mouth every 4 (four) hours as needed for moderate pain. 01/29/16   Hinda Kehr, MD  meloxicam (MOBIC) 15 MG tablet Take 1 tablet (15 mg total) by mouth daily for 7 days. 12/18/17 12/25/17  Lannie Fields, PA-C    Allergies Patient has no known allergies.  History reviewed. No pertinent family history.  Social History Social History   Tobacco Use  . Smoking status: Current Every Day Smoker    Packs/day: 0.50    Types: Cigarettes  . Smokeless tobacco: Never Used  Substance Use Topics  . Alcohol use: No  . Drug use: No     Review of Systems  Constitutional: No fever/chills Eyes: No visual changes. No discharge ENT: No upper respiratory complaints. Cardiovascular: no chest pain. Respiratory: no cough. No SOB. Musculoskeletal: Patient has left knee pain.  Skin: Negative for rash, abrasions, lacerations, ecchymosis. Neurological: Negative for headaches, focal weakness or numbness.   ____________________________________________   PHYSICAL EXAM:  VITAL SIGNS: ED Triage Vitals  Enc Vitals Group     BP 12/18/17 1811 (!) 163/88     Pulse Rate 12/18/17 1811 85     Resp 12/18/17 1811 16     Temp 12/18/17 1811 97.9 F (36.6 C)     Temp src --      SpO2 12/18/17 1811 100 %     Weight 12/18/17 1812 266 lb (120.7 kg)     Height 12/18/17 1812 5'  11" (1.803 m)     Head Circumference --      Peak Flow --      Pain Score 12/18/17 1812 7     Pain Loc --      Pain Edu? --      Excl. in [place]? --      Constitutional: Alert and oriented. Well appearing and in no acute distress. Eyes: Conjunctivae are normal. PERRL. EOMI. Head: Atraumatic. Cardiovascular: Normal rate, regular rhythm. Normal S1 and S2.  Good peripheral circulation. Respiratory: Normal respiratory effort without tachypnea or retractions.  Lungs CTAB. Good air entry to the bases with no decreased or absent breath sounds. Musculoskeletal: Patient has mild left knee edema.  No laxity with MCL or LCL testing.  Negative apprehension.  Mild ballottement.  Negative anterior and posterior drawer test.  Palpable dorsalis pedis pulse, left.  No pain was elicited with palpation of the calf.  No edema of the left ankle. Neurologic:  Normal speech and language. No gross focal neurologic deficits are appreciated.   ____________________________________________   LABS (all labs ordered are listed, but only abnormal results are displayed)  Labs Reviewed - No data to display ____________________________________________  EKG   ____________________________________________  RADIOLOGY Unk Pinto, personally viewed and evaluated these images (plain radiographs) as part of my medical decision making, as well as reviewing the written report by the radiologist.  Dg Knee Complete 4 Views Left  Result Date: 12/18/2017 CLINICAL DATA:  Knee pain after fall EXAM: LEFT KNEE - COMPLETE 4+ VIEW COMPARISON:  None. FINDINGS: No fracture or malalignment. Mild degenerative changes involving the medial compartment of the knee. Small knee effusion. IMPRESSION: No acute osseous abnormality.  Small knee effusion. Electronically Signed   By: Donavan Foil M.D.   On: 12/18/2017 18:32    ____________________________________________    PROCEDURES  Procedure(s) performed:    Procedures    Medications - No data to display   ____________________________________________   INITIAL IMPRESSION / ASSESSMENT AND PLAN / ED COURSE  Pertinent labs & imaging results that were available during my care of the patient were reviewed by me and considered in my medical decision making (see chart for details).  Review of the [place] CSRS was performed in accordance of the [place] prior to dispensing any controlled drugs.     Assessment and plan Left knee  pain Differential diagnosis includes arthritis, meniscal tear, chronic ACL tear and residual knee sprain.  Patient had mild edema of the left knee on physical exam with reproducible pain to palpation over the anterior knee.  X-ray examination reveals findings consistent with osteoarthritis.  Patient was discharged with meloxicam and advised to use ice nightly over her left knee.  Patient was referred to orthopedics.  Strict return precautions were given to return to the emergency department with new or worsening symptoms.  Vital signs are reassuring prior to discharge.  All patient questions were answered.    ____________________________________________  FINAL CLINICAL IMPRESSION(S) / ED DIAGNOSES  Final diagnoses:  Acute pain of left knee      NEW MEDICATIONS STARTED DURING THIS VISIT:  ED Discharge Orders        Ordered    meloxicam (MOBIC) 15 MG tablet  Daily     12/18/17 1938          This chart was dictated using voice recognition software/Dragon. Despite best efforts to proofread, errors can occur which can change the meaning. Any change was purely unintentional.    Vallarie Mare  Curt Jews 12/18/17 1946    Carrie Mew, MD 12/18/17 2330

## 2017-12-20 ENCOUNTER — Ambulatory Visit (INDEPENDENT_AMBULATORY_CARE_PROVIDER_SITE_OTHER): Payer: Self-pay | Admitting: Family Medicine

## 2017-12-20 ENCOUNTER — Encounter: Payer: Self-pay | Admitting: Family Medicine

## 2017-12-20 VITALS — BP 162/88 | HR 91 | Temp 98.6°F | Resp 16 | Ht 71.0 in | Wt 265.5 lb

## 2017-12-20 DIAGNOSIS — I10 Essential (primary) hypertension: Secondary | ICD-10-CM

## 2017-12-20 DIAGNOSIS — J449 Chronic obstructive pulmonary disease, unspecified: Secondary | ICD-10-CM

## 2017-12-20 DIAGNOSIS — Z7689 Persons encountering health services in other specified circumstances: Secondary | ICD-10-CM

## 2017-12-20 DIAGNOSIS — M1712 Unilateral primary osteoarthritis, left knee: Secondary | ICD-10-CM

## 2017-12-20 MED ORDER — HYDROCHLOROTHIAZIDE 25 MG PO TABS: 25.0000 mg | ORAL_TABLET | Freq: Every day | ORAL | 0 refills | Status: DC

## 2017-12-20 MED ORDER — BUDESONIDE-FORMOTEROL FUMARATE 160-4.5 MCG/ACT IN AERO: 2.0000 | INHALATION_SPRAY | Freq: Two times a day (BID) | RESPIRATORY_TRACT | 2 refills | Status: DC

## 2017-12-20 NOTE — Progress Notes (Signed)
Name: Joan Davis   MRN: [id number]    DOB: Mar 24, 1966   Date:12/20/2017       Progress Note  Subjective  Chief Complaint  Chief Complaint  Patient presents with  . Establish Care    elevated blood pressure seen in ER    HPI  Pt presents to establish care - she used to go to [facility]) - has not been seen in about 3 years.  She is currently awaiting her health insurance - started full time with [employer] full time and [employer].  HTN: Has had HTN most of her adult life.  She was taking HCTZ but has been off of the medication for 1.5 years - she had lost weight and her BP had come down.  She started noticing some headaches and dizziness, and some intermittently blurred vision 2 days ago.  She does get some mild BLE edema - elevates feet at night and this typically goes down.  She very much declines lab work today, would like to return after her insurance is in place.  Knee Pain: Has seen 12/18/17 in ER for LEFT knee pain - she was given Meloxicam which seems to be helping.  She has a compression sleeve that she wears when the pain really worsens. Xrays in the ER showed mild degenerative changes with no acute findings.  COPD: Current smoker - 3/4-1ppd x30 years. Denies shortness of breath, chest pain, or dyspnea on exertion.  Has intermittent congested cough.  Takes Symbicort as needed -discussed using daily. She does not have albuterol inhaler and does not feel like she needs one today.  Patient Active Problem List   Diagnosis Date Noted  . Food impaction of esophagus     Past Surgical History:  Procedure Laterality Date  . FOREIGN BODY REMOVAL N/A 07/15/2016   Procedure: FOREIGN BODY REMOVAL;  Surgeon: Lucilla Lame, MD;  Location: [facility] ENDOSCOPY;  Service: Endoscopy;  Laterality: N/A;    No family history on file.  Social History   Socioeconomic History  . Marital status: Single    Spouse name: Not on file  . Number of  children: Not on file  . Years of education: Not on file  . Highest education level: Not on file  Social Needs  . Financial resource strain: Not on file  . Food insecurity - worry: Not on file  . Food insecurity - inability: Not on file  . Transportation needs - medical: Not on file  . Transportation needs - non-medical: Not on file  Occupational History  . Not on file  Tobacco Use  . Smoking status: Current Every Day Smoker    Packs/day: 0.50    Types: Cigarettes  . Smokeless tobacco: Never Used  Substance and Sexual Activity  . Alcohol use: No  . Drug use: No  . Sexual activity: Not on file  Other Topics Concern  . Not on file  Social History Narrative  . Not on file     Current Outpatient Medications:  .  meloxicam (MOBIC) 15 MG tablet, Take 1 tablet (15 mg total) by mouth daily for 7 days., Disp: 30 tablet, Rfl: 1 .  budesonide-formoterol (SYMBICORT) 160-4.5 MCG/ACT inhaler, Inhale 2 puffs into the lungs 2 (two) times daily., Disp: , Rfl:  .  cephALEXin (KEFLEX) 500 MG capsule, Take 1 capsule (500 mg total) by mouth 3 (three) times daily. (Patient not taking: Reported on 12/20/2017), Disp: 21 capsule, Rfl: 0 .  docusate  sodium (COLACE) 100 MG capsule, Take 1 tablet once or twice daily as needed for constipation while taking narcotic pain medicine (Patient not taking: Reported on 12/20/2017), Disp: 30 capsule, Rfl: 0 .  hydrochlorothiazide (HYDRODIURIL) 25 MG tablet, Take 25 mg by mouth daily., Disp: , Rfl:  .  hydrochlorothiazide (HYDRODIURIL) 25 MG tablet, Take 25 mg by mouth daily., Disp: , Rfl:  .  HYDROcodone-acetaminophen (NORCO/VICODIN) 5-325 MG tablet, Take 1-2 tablets by mouth every 4 (four) hours as needed for moderate pain. (Patient not taking: Reported on 12/20/2017), Disp: 15 tablet, Rfl: 0  Not on File  ROS Constitutional: Negative for fever or weight change.  Respiratory:See HPI Cardiovascular: Negative for chest pain or palpitations.  Gastrointestinal:  Negative for abdominal pain, no bowel changes.  Musculoskeletal: Negative for gait problem or joint swelling. See HPI Skin: Negative for rash.  Neurological: See HPI No other specific complaints in a complete review of systems (except as listed in HPI above).  Objective  Vitals:   12/20/17 0803  BP: (!) 162/88  Pulse: 91  Resp: 16  Temp: 98.6 F (37 C)  TempSrc: Oral  SpO2: 97%  Weight: 265 lb 8 oz (120.4 kg)  Height: 5\' 11"  (1.803 m)   Body mass index is 37.03 kg/m.  Physical Exam Constitutional: Patient appears well-developed and well-nourished. No distress.  HENT: Head: Normocephalic and atraumatic. Eyes: Conjunctivae and EOM are normal. Pupils are equal, round, and reactive to light. No scleral icterus.  Cardiovascular: Normal rate, regular rhythm and normal heart sounds.  No murmur heard. No BLE edema. Pulmonary/Chest: Effort normal and breath sounds normal. No respiratory distress. Abdominal: Soft. Bowel sounds are normal, no distension. There is no tenderness. no masses Musculoskeletal: Normal range of motion, no joint effusions. No gross deformities Neurological: she is alert and oriented to person, place, and time. No cranial nerve deficit. Coordination, balance, strength, speech and gait are normal.  Skin: Skin is warm and dry. No rash noted. No erythema.  Psychiatric: Patient has a normal mood and affect. behavior is normal. Judgment and thought content normal.  No results found for this or any previous visit (from the past 72 hour(s)).  PHQ2/9: Depression screen PHQ 2/9 12/20/2017  Decreased Interest 0  Down, Depressed, Hopeless 0  PHQ - 2 Score 0   Fall Risk: Fall Risk  12/20/2017  Falls in the past year? No   Assessment & Plan  1. Essential hypertension - Discussed need for labs in the near future, pt is awaiting health insurance to start - will return ASAP after this to obtain labs. - hydrochlorothiazide (HYDRODIURIL) 25 MG tablet; Take 1 tablet (25 mg  total) by mouth daily.  Dispense: 30 tablet; Refill: 0  2. Chronic obstructive pulmonary disease, unspecified COPD type (HCC) - budesonide-formoterol (SYMBICORT) 160-4.5 MCG/ACT inhaler; Inhale 2 puffs into the lungs 2 (two) times daily.  Dispense: 1 Inhaler; Refill: 2  3. Arthritis of left knee - Continue Meloxicam PRN - May wear sleeve on knee PRN  4. Encounter to establish care - Schedule CPE w/ fasting labs ASAP after she obtains health insurance.  -Reviewed Health Maintenance: Declines today - will await her health insurance to start, will schedule physical

## 2017-12-20 NOTE — Patient Instructions (Signed)
DASH Eating Plan DASH stands for "Dietary Approaches to Stop Hypertension." The DASH eating plan is a healthy eating plan that has been shown to reduce high blood pressure (hypertension). It may also reduce your risk for type 2 diabetes, heart disease, and stroke. The DASH eating plan may also help with weight loss. What are tips for following this plan? General guidelines  Avoid eating more than 2,300 mg (milligrams) of salt (sodium) a day. If you have hypertension, you may need to reduce your sodium intake to 1,500 mg a day.  Limit alcohol intake to no more than 1 drink a day for nonpregnant women and 2 drinks a day for men. One drink equals 12 oz of beer, 5 oz of wine, or 1 oz of hard liquor.  Work with your health care provider to maintain a healthy body weight or to lose weight. Ask what an ideal weight is for you.  Get at least 30 minutes of exercise that causes your heart to beat faster (aerobic exercise) most days of the week. Activities may include walking, swimming, or biking.  Work with your health care provider or diet and nutrition specialist (dietitian) to adjust your eating plan to your individual calorie needs. Reading food labels  Check food labels for the amount of sodium per serving. Choose foods with less than 5 percent of the Daily Value of sodium. Generally, foods with less than 300 mg of sodium per serving fit into this eating plan.  To find whole grains, look for the word "whole" as the first word in the ingredient list. Shopping  Buy products labeled as "low-sodium" or "no salt added."  Buy fresh foods. Avoid canned foods and premade or frozen meals. Cooking  Avoid adding salt when cooking. Use salt-free seasonings or herbs instead of table salt or sea salt. Check with your health care provider or pharmacist before using salt substitutes.  Do not fry foods. Cook foods using healthy methods such as baking, boiling, grilling, and broiling instead.  Cook with  heart-healthy oils, such as olive, canola, soybean, or sunflower oil. Meal planning   Eat a balanced diet that includes: ? 5 or more servings of fruits and vegetables each day. At each meal, try to fill half of your plate with fruits and vegetables. ? Up to 6-8 servings of whole grains each day. ? Less than 6 oz of lean meat, poultry, or fish each day. A 3-oz serving of meat is about the same size as a deck of cards. One egg equals 1 oz. ? 2 servings of low-fat dairy each day. ? A serving of nuts, seeds, or beans 5 times each week. ? Heart-healthy fats. Healthy fats called Omega-3 fatty acids are found in foods such as flaxseeds and coldwater fish, like sardines, salmon, and mackerel.  Limit how much you eat of the following: ? Canned or prepackaged foods. ? Food that is high in trans fat, such as fried foods. ? Food that is high in saturated fat, such as fatty meat. ? Sweets, desserts, sugary drinks, and other foods with added sugar. ? Full-fat dairy products.  Do not salt foods before eating.  Try to eat at least 2 vegetarian meals each week.  Eat more home-cooked food and less restaurant, buffet, and fast food.  When eating at a restaurant, ask that your food be prepared with less salt or no salt, if possible. What foods are recommended? The items listed may not be a complete list. Talk with your dietitian about what   dietary choices are best for you. Grains Whole-grain or whole-wheat bread. Whole-grain or whole-wheat pasta. Brown rice. Oatmeal. Quinoa. Bulgur. Whole-grain and low-sodium cereals. Pita bread. Low-fat, low-sodium crackers. Whole-wheat flour tortillas. Vegetables Fresh or frozen vegetables (raw, steamed, roasted, or grilled). Low-sodium or reduced-sodium tomato and vegetable juice. Low-sodium or reduced-sodium tomato sauce and tomato paste. Low-sodium or reduced-sodium canned vegetables. Fruits All fresh, dried, or frozen fruit. Canned fruit in natural juice (without  added sugar). Meat and other protein foods Skinless chicken or turkey. Ground chicken or turkey. Pork with fat trimmed off. Fish and seafood. Egg whites. Dried beans, peas, or lentils. Unsalted nuts, nut butters, and seeds. Unsalted canned beans. Lean cuts of beef with fat trimmed off. Low-sodium, lean deli meat. Dairy Low-fat (1%) or fat-free (skim) milk. Fat-free, low-fat, or reduced-fat cheeses. Nonfat, low-sodium ricotta or cottage cheese. Low-fat or nonfat yogurt. Low-fat, low-sodium cheese. Fats and oils Soft margarine without trans fats. Vegetable oil. Low-fat, reduced-fat, or light mayonnaise and salad dressings (reduced-sodium). Canola, safflower, olive, soybean, and sunflower oils. Avocado. Seasoning and other foods Herbs. Spices. Seasoning mixes without salt. Unsalted popcorn and pretzels. Fat-free sweets. What foods are not recommended? The items listed may not be a complete list. Talk with your dietitian about what dietary choices are best for you. Grains Baked goods made with fat, such as croissants, muffins, or some breads. Dry pasta or rice meal packs. Vegetables Creamed or fried vegetables. Vegetables in a cheese sauce. Regular canned vegetables (not low-sodium or reduced-sodium). Regular canned tomato sauce and paste (not low-sodium or reduced-sodium). Regular tomato and vegetable juice (not low-sodium or reduced-sodium). Pickles. Olives. Fruits Canned fruit in a light or heavy syrup. Fried fruit. Fruit in cream or butter sauce. Meat and other protein foods Fatty cuts of meat. Ribs. Fried meat. Bacon. Sausage. Bologna and other processed lunch meats. Salami. Fatback. Hotdogs. Bratwurst. Salted nuts and seeds. Canned beans with added salt. Canned or smoked fish. Whole eggs or egg yolks. Chicken or turkey with skin. Dairy Whole or 2% milk, cream, and half-and-half. Whole or full-fat cream cheese. Whole-fat or sweetened yogurt. Full-fat cheese. Nondairy creamers. Whipped toppings.  Processed cheese and cheese spreads. Fats and oils Butter. Stick margarine. Lard. Shortening. Ghee. Bacon fat. Tropical oils, such as coconut, palm kernel, or palm oil. Seasoning and other foods Salted popcorn and pretzels. Onion salt, garlic salt, seasoned salt, table salt, and sea salt. Worcestershire sauce. Tartar sauce. Barbecue sauce. Teriyaki sauce. Soy sauce, including reduced-sodium. Steak sauce. Canned and packaged gravies. Fish sauce. Oyster sauce. Cocktail sauce. Horseradish that you find on the shelf. Ketchup. Mustard. Meat flavorings and tenderizers. Bouillon cubes. Hot sauce and Tabasco sauce. Premade or packaged marinades. Premade or packaged taco seasonings. Relishes. Regular salad dressings. Where to find more information:  National Heart, Lung, and Blood Institute: www.nhlbi.nih.gov  American Heart Association: www.heart.org Summary  The DASH eating plan is a healthy eating plan that has been shown to reduce high blood pressure (hypertension). It may also reduce your risk for type 2 diabetes, heart disease, and stroke.  With the DASH eating plan, you should limit salt (sodium) intake to 2,300 mg a day. If you have hypertension, you may need to reduce your sodium intake to 1,500 mg a day.  When on the DASH eating plan, aim to eat more fresh fruits and vegetables, whole grains, lean proteins, low-fat dairy, and heart-healthy fats.  Work with your health care provider or diet and nutrition specialist (dietitian) to adjust your eating plan to your individual   calorie needs. This information is not intended to replace advice given to you by your health care provider. Make sure you discuss any questions you have with your health care provider. Document Released: 11/01/2011 Document Revised: 11/05/2016 Document Reviewed: 11/05/2016 Elsevier Interactive Patient Education  2018 Elsevier Inc.  

## 2017-12-23 ENCOUNTER — Other Ambulatory Visit: Payer: Self-pay | Admitting: Family Medicine

## 2017-12-23 ENCOUNTER — Other Ambulatory Visit: Payer: Self-pay

## 2017-12-23 ENCOUNTER — Emergency Department
Admission: EM | Admit: 2017-12-23 | Discharge: 2017-12-23 | Disposition: A | Discharge status: Home / Self Care | Payer: Self-pay | Attending: Emergency Medicine | Admitting: Emergency Medicine

## 2017-12-23 ENCOUNTER — Ambulatory Visit: Payer: Self-pay

## 2017-12-23 ENCOUNTER — Telehealth: Payer: Self-pay | Admitting: Family Medicine

## 2017-12-23 ENCOUNTER — Encounter: Payer: Self-pay | Admitting: Emergency Medicine

## 2017-12-23 VITALS — BP 120/78

## 2017-12-23 DIAGNOSIS — I10 Essential (primary) hypertension: Secondary | ICD-10-CM

## 2017-12-23 DIAGNOSIS — F17210 Nicotine dependence, cigarettes, uncomplicated: Secondary | ICD-10-CM | POA: Insufficient documentation

## 2017-12-23 DIAGNOSIS — J449 Chronic obstructive pulmonary disease, unspecified: Secondary | ICD-10-CM | POA: Insufficient documentation

## 2017-12-23 DIAGNOSIS — Z79899 Other long term (current) drug therapy: Secondary | ICD-10-CM | POA: Insufficient documentation

## 2017-12-23 DIAGNOSIS — R252 Cramp and spasm: Secondary | ICD-10-CM | POA: Insufficient documentation

## 2017-12-23 DIAGNOSIS — R42 Dizziness and giddiness: Secondary | ICD-10-CM | POA: Insufficient documentation

## 2017-12-23 LAB — CBC
HEMATOCRIT: 44.5 % (ref 35.0–47.0)
HEMOGLOBIN: 14.4 g/dL (ref 12.0–16.0)
MCH: 30.8 pg (ref 26.0–34.0)
MCHC: 32.4 g/dL (ref 32.0–36.0)
MCV: 95 fL (ref 80.0–100.0)
Platelets: 333 10*3/uL (ref 150–440)
RBC: 4.68 MIL/uL (ref 3.80–5.20)
RDW: 13.9 % (ref 11.5–14.5)
WBC: 8.6 10*3/uL (ref 3.6–11.0)

## 2017-12-23 LAB — URINALYSIS, COMPLETE (UACMP) WITH MICROSCOPIC
BILIRUBIN URINE: NEGATIVE
Bacteria, UA: NONE SEEN
GLUCOSE, UA: NEGATIVE mg/dL
Hgb urine dipstick: NEGATIVE
KETONES UR: NEGATIVE mg/dL
NITRITE: NEGATIVE
PH: 5 (ref 5.0–8.0)
Protein, ur: NEGATIVE mg/dL
Specific Gravity, Urine: 1.02 (ref 1.005–1.030)

## 2017-12-23 LAB — BASIC METABOLIC PANEL WITH GFR
BUN: 15 mg/dL (ref 7–25)
CALCIUM: 9.4 mg/dL (ref 8.6–10.4)
CHLORIDE: 106 mmol/L (ref 98–110)
CO2: 31 mmol/L (ref 20–32)
Creat: 0.81 mg/dL (ref 0.50–1.05)
GFR, EST NON AFRICAN AMERICAN: 84 mL/min/{1.73_m2} (ref >=60)
GFR, Est African American: 97 mL/min/{1.73_m2} (ref >=60)
Glucose, Bld: 83 mg/dL (ref 65–99)
POTASSIUM: 3.6 mmol/L (ref 3.5–5.3)
Sodium: 142 mmol/L (ref 135–146)

## 2017-12-23 LAB — BASIC METABOLIC PANEL
ANION GAP: 10 (ref 5–15)
BUN: 17 mg/dL (ref 6–20)
CALCIUM: 8.9 mg/dL (ref 8.9–10.3)
CO2: 25 mmol/L (ref 22–32)
Chloride: 104 mmol/L (ref 101–111)
Creatinine, Ser: 0.8 mg/dL (ref 0.44–1.00)
Glucose, Bld: 103 mg/dL — ABNORMAL HIGH (ref 65–99)
POTASSIUM: 3.6 mmol/L (ref 3.5–5.1)
Sodium: 139 mmol/L (ref 135–145)

## 2017-12-23 LAB — GLUCOSE, CAPILLARY: GLUCOSE-CAPILLARY: 95 mg/dL (ref 65–99)

## 2017-12-23 MED ORDER — AMLODIPINE BESYLATE 5 MG PO TABS: 5.0000 mg | ORAL_TABLET | Freq: Every day | ORAL | 1 refills | Status: DC

## 2017-12-23 MED ORDER — AMLODIPINE BESYLATE 5 MG PO TABS
5.0000 mg | ORAL_TABLET | Freq: Once | ORAL | Status: AC
  Administered 2017-12-23: 5 mg via ORAL
  Filled 2017-12-23: qty 1

## 2017-12-23 NOTE — ED Provider Notes (Signed)
[facility] Emergency Department Provider Note  ____________________________________________  Time seen: Approximately 11:32 PM  I have reviewed the triage vital signs and the nursing notes.   HISTORY  Chief Complaint Spasms and Dizziness    HPI Joan Davis is a 52 y.o. female who complains of dizziness in the past 3 days as well as some muscle cramping that started today. This all occurred after she started taking hydrochlorothiazide for blood pressure 3 days ago. No paresthesias or weakness or vision changes. No falls or trauma. No chest pain or shortness of breath or syncope. No fevers or chills. No aggravating or alleviating factors. Mild to moderate in severity. No radiating pain.     Past Medical History:  Diagnosis Date  . COPD (chronic obstructive pulmonary disease) ([address])   . Hypertension      Patient Active Problem List   Diagnosis Date Noted  . COPD (chronic obstructive pulmonary disease) ([place]) 12/20/2017  . Hypertension 12/20/2017  . Food impaction of esophagus      Past Surgical History:  Procedure Laterality Date  . FOREIGN BODY REMOVAL N/A 07/15/2016   Procedure: FOREIGN BODY REMOVAL;  Surgeon: Lucilla Lame, MD;  Location: [facility] ENDOSCOPY;  Service: Endoscopy;  Laterality: N/A;  . TOTAL ABDOMINAL HYSTERECTOMY       Prior to Admission medications   Medication Sig Start Date End Date Taking? Authorizing Provider  amLODipine (NORVASC) 5 MG tablet Take 1 tablet (5 mg total) by mouth daily. 12/23/17   Carrie Mew, MD  budesonide-formoterol [employer]) 160-4.5 MCG/ACT inhaler Inhale 2 puffs into the lungs 2 (two) times daily. 12/20/17   Hubbard Hartshorn, FNP  meloxicam (MOBIC) 15 MG tablet Take 1 tablet (15 mg total) by mouth daily for 7 days. 12/18/17 12/25/17  Lannie Fields, PA-C     Allergies Patient has no known allergies.   Family History  Problem Relation Age of Onset  . Alcohol abuse Mother   . Heart disease Father    . Hypertension Father   . Hypertension Brother   . Asthma Maternal Grandmother   . Stroke Maternal Grandfather   . Hypertension Maternal Grandfather   . Congestive Heart Failure Paternal Grandmother   . Heart disease Paternal Grandfather   . Congestive Heart Failure Paternal Grandfather     Social History Social History   Tobacco Use  . Smoking status: Current Every Day Smoker    Packs/day: 0.50    Types: Cigarettes  . Smokeless tobacco: Never Used  Substance Use Topics  . Alcohol use: No  . Drug use: No    Review of Systems  Constitutional:   No fever or chills.  ENT:   No sore throat. No rhinorrhea. Cardiovascular:   No chest pain or syncope. Respiratory:   No dyspnea or cough. Gastrointestinal:   Negative for abdominal pain, vomiting and diarrhea.  Musculoskeletal:   Positive for varied muscle cramps All other systems reviewed and are negative except as documented above in ROS and HPI.  ____________________________________________   PHYSICAL EXAM:  VITAL SIGNS: ED Triage Vitals  Enc Vitals Group     BP 12/23/17 2122 133/82     Pulse Rate 12/23/17 2122 84     Resp 12/23/17 2122 18     Temp 12/23/17 2122 98.4 F (36.9 C)     Temp Source 12/23/17 2122 Oral     SpO2 12/23/17 2122 99 %     Weight 12/23/17 2123 265 lb (120.2 kg)     Height 12/23/17  2123 5\' 11"  (1.803 m)     Head Circumference --      Peak Flow --      Pain Score 12/23/17 2123 8     Pain Loc --      Pain Edu? --      Excl. in [place]? --     Vital signs reviewed, nursing assessments reviewed.   Constitutional:   Alert and oriented. Well appearing and in no distress. Eyes:   No scleral icterus.  EOMI. No nystagmus. No conjunctival pallor. PERRL. ENT   Head:   Normocephalic and atraumatic.   Nose:   No congestion/rhinnorhea.    Mouth/Throat:   MMM, no pharyngeal erythema. No peritonsillar mass.    Neck:   No meningismus. Full ROM. Hematological/Lymphatic/Immunilogical:   No  cervical lymphadenopathy. Cardiovascular:   RRR. Symmetric bilateral radial and DP pulses.  No murmurs.  Respiratory:   Normal respiratory effort without tachypnea/retractions. Breath sounds are clear and equal bilaterally. No wheezes/rales/rhonchi. Gastrointestinal:   Soft and nontender. Non distended. There is no CVA tenderness.  No rebound, rigidity, or guarding. Genitourinary:   deferred Musculoskeletal:   Normal range of motion in all extremities. No joint effusions.  No lower extremity tenderness.  No edema. Neurologic:   Normal speech and language.  Cranial nerves III through XII intact Motor grossly intact. No acute focal neurologic deficits are appreciated.  ___________________________________________    LABS (pertinent positives/negatives) (all labs ordered are listed, but only abnormal results are displayed) Labs Reviewed  BASIC METABOLIC PANEL - Abnormal; Notable for the following components:      Result Value   Glucose, Bld 103 (*)    All other components within normal limits  URINALYSIS, COMPLETE (UACMP) WITH MICROSCOPIC - Abnormal; Notable for the following components:   Color, Urine YELLOW (*)    APPearance HAZY (*)    Leukocytes, UA SMALL (*)    Squamous Epithelial / LPF 6-30 (*)    All other components within normal limits  URINE CULTURE  CBC  GLUCOSE, CAPILLARY  CBG MONITORING, ED   ____________________________________________   EKG  Interpreted by me Sinus rhythm rate of 94, normal axis and intervals. Normal QRS ST segments and T waves. 2 PVCs on the strip.  ____________________________________________    RADIOLOGY  No results found.  ____________________________________________   PROCEDURES Procedures  ____________________________________________    CLINICAL IMPRESSION / ASSESSMENT AND PLAN / ED COURSE  Pertinent labs & imaging results that were available during my care of the patient were reviewed by me and considered in my medical  decision making (see chart for details).   Patient well appearing no acute distress, presents with dizziness and muscle cramps that started after initiating hydrochlorothiazide for blood pressure. Here her blood pressure is essentially unremarkable and her other vital signs are normal. Neurologic exam is intact in the rest of her exam is reassuring. This appears to be deduced to side effects from HCTZ. We'll have her discontinue the HCTZ and start amlodipine for blood pressure management. Follow up with primary care for continued monitoring of blood pressure and symptoms.Considering the patient's symptoms, medical history, and physical examination today, I have low suspicion for ischemic stroke, intracranial hemorrhage, meningitis, encephalitis, carotid or vertebral dissection, venous sinus thrombosis, MS, intracranial hypertension, glaucoma, CRAO, CRVO, or temporal arteritis.       ____________________________________________   FINAL CLINICAL IMPRESSION(S) / ED DIAGNOSES    Final diagnoses:  Dizziness  Essential hypertension       Portions of this note  were generated with dragon dictation software. Dictation errors may occur despite best attempts at proofreading.    Carrie Mew, MD 12/23/17 2336

## 2017-12-23 NOTE — ED Triage Notes (Signed)
Pt presents to ED with c/o dizziness since Friday and abd / muscle cramping that started today. Pt states she was seen in this ED the 23rd and was told she had elevated blood pressure and told to follow up with pcp. pcp placed pt on HCTZ Friday. Pt was told the medication may be causing her symptoms and was told come to ED and have her potassium checked.

## 2017-12-23 NOTE — Telephone Encounter (Unsigned)
Copied from [place] [id number]. Topic: General - Other >> Dec 23, 2017  6:25 PM Neva Seat wrote: [facility] [place] - [phone] is faxing over stats for pt.

## 2017-12-23 NOTE — Progress Notes (Addendum)
Pt presented to clinic for BP check/nurse visit - she is feeling slightly better, headache has improved somewhat, no chest pain or shortness of breath, no palpitations. Bilateral Upper extremity BP check was performed and reading was 120/78 LEFT arm, 122/80 RIGHT arm.  We will maintain the course of HCTZ at this time, however I again insisted that labs be drawn due to risk of electrolyte imbalance when re-starting HCTZ. Pt agrees to have BMP drawn today - this is ordered and performed in office lab today.  Red flags for when to present for emergency care were discussed prior to patient leaving office.

## 2017-12-23 NOTE — ED Notes (Signed)
Pt discharged to home.  Family member driving.  Discharge instructions reviewed.  Verbalized understanding.  No questions or concerns at this time.  Teach back verified.  Pt in NAD.  No items left in ED.   

## 2017-12-24 ENCOUNTER — Telehealth: Payer: Self-pay

## 2017-12-24 NOTE — Telephone Encounter (Signed)
Patient has been seen at ER on 12/23/2017. Labs given to [facility]

## 2017-12-24 NOTE — Telephone Encounter (Signed)
Called pt she states that she is feeling much better. Pt agrees to ER f/u appt. Scheduled pt at 10:20am, 12/27/17 per pt's request.

## 2017-12-24 NOTE — Telephone Encounter (Signed)
-----   Message from Hubbard Hartshorn, FNP sent at 12/24/2017  7:58 AM EST ----- Pt seen in ER last night for dizziness - was taken off HCTZ and put on Amlodipine.  Please ask her how she is feeling, and change follow up to either late this week or early next week. Thanks!

## 2017-12-26 LAB — URINE CULTURE

## 2017-12-27 ENCOUNTER — Ambulatory Visit: Payer: Self-pay | Admitting: Family Medicine

## 2017-12-30 ENCOUNTER — Ambulatory Visit: Payer: Self-pay | Admitting: Family Medicine

## 2018-01-03 ENCOUNTER — Ambulatory Visit: Payer: Self-pay | Admitting: Family Medicine

## 2018-02-06 ENCOUNTER — Encounter: Payer: Self-pay | Admitting: Family Medicine

## 2018-02-06 ENCOUNTER — Ambulatory Visit (INDEPENDENT_AMBULATORY_CARE_PROVIDER_SITE_OTHER): Payer: Self-pay | Admitting: Family Medicine

## 2018-02-06 VITALS — BP 124/80 | HR 82 | Temp 98.1°F | Wt 265.9 lb

## 2018-02-06 DIAGNOSIS — R42 Dizziness and giddiness: Secondary | ICD-10-CM

## 2018-02-06 DIAGNOSIS — I10 Essential (primary) hypertension: Secondary | ICD-10-CM

## 2018-02-06 DIAGNOSIS — R0981 Nasal congestion: Secondary | ICD-10-CM

## 2018-02-06 DIAGNOSIS — N3000 Acute cystitis without hematuria: Secondary | ICD-10-CM

## 2018-02-06 MED ORDER — FLUTICASONE PROPIONATE 50 MCG/ACT NA SUSP: 2.0000 | Freq: Every day | NASAL | 2 refills | Status: DC

## 2018-02-06 MED ORDER — MECLIZINE HCL 25 MG PO CHEW: 12.5000 mg | CHEWABLE_TABLET | Freq: Two times a day (BID) | ORAL | 0 refills | Status: DC | PRN

## 2018-02-06 MED ORDER — LISINOPRIL-HYDROCHLOROTHIAZIDE 20-12.5 MG PO TABS: 1.0000 | ORAL_TABLET | Freq: Every day | ORAL | 0 refills | Status: DC

## 2018-02-06 NOTE — Progress Notes (Signed)
Name: Joan Davis   MRN: [id number]    DOB: [birth year]   Date:02/06/2018       Progress Note  Subjective  Chief Complaint  Chief Complaint  Patient presents with  . Facial Pain    Pt c/o sinus pressure and pain w/ dizziness   . Hypertension    Pt states she has been doubling BP meds   . Edema    Pt states that she is having swelling in ankles and feet and knees     HPI  ER Follow up: Went to ER on 12/23/2017 for dizziness.  She was switched to amlodipine due to concern that HCTZ was possibly causing her dizziness.  She was later called back and told that she had a UTI - given antibiotic and completed this course - dizziness then subsided after completion of Abx for several weeks before returning (see below regarding recurrence). We will resend urine for culture today to ensure resolution of infection.  Denies hematuria, dysuria, or urinary frequency.  HTN: BP is controlled today. She would like to come off of Amlodipine and go back on HCTZ because she is having BLE swelling and does not feel the HCTZ was causing her dizziness (feels that it was more related to UTI). Discussed risk/benefit of returning back to HCTZ, and it is agreed that we will trial on low-dose HCTZ combined with Lisinopril.  Denies chest pain or shortness of breath.  Sinus Pressure and Dizziness: She notes sinus pressure that began yesterday along with dizziness x2-3 days.  Dizziness worsens when she moves in certain positions; notes history of vertigo and this episode feels very similar.  She denies urinary symptoms, denies vision change, confusion, speech changes, extremity weakness, ear pain/pressure, fevers/chills, chest pain, shortness of breath.  Patient Active Problem List   Diagnosis Date Noted  . COPD (chronic obstructive pulmonary disease) ([place]) 12/20/2017  . Hypertension 12/20/2017  . Food impaction of esophagus     Past Surgical History:  Procedure Laterality Date  . FOREIGN BODY REMOVAL N/A  07/15/2016   Procedure: FOREIGN BODY REMOVAL;  Surgeon: Lucilla Lame, MD;  Location: [facility] ENDOSCOPY;  Service: Endoscopy;  Laterality: N/A;  . TOTAL ABDOMINAL HYSTERECTOMY      Family History  Problem Relation Age of Onset  . Alcohol abuse Mother   . Heart disease Father   . Hypertension Father   . Hypertension Brother   . Asthma Maternal Grandmother   . Stroke Maternal Grandfather   . Hypertension Maternal Grandfather   . Congestive Heart Failure Paternal Grandmother   . Heart disease Paternal Grandfather   . Congestive Heart Failure Paternal Grandfather     Social History   Socioeconomic History  . Marital status: Single    Spouse name: Not on file  . Number of children: Not on file  . Years of education: Not on file  . Highest education level: Not on file  Social Needs  . Financial resource strain: Not on file  . Food insecurity - worry: Not on file  . Food insecurity - inability: Not on file  . Transportation needs - medical: Not on file  . Transportation needs - non-medical: Not on file  Occupational History  . Not on file  Tobacco Use  . Smoking status: Current Every Day Smoker    Packs/day: 0.50    Types: Cigarettes  . Smokeless tobacco: Never Used  Substance and Sexual Activity  . Alcohol use: No  . Drug use: No  . Sexual activity:  Yes  Other Topics Concern  . Not on file  Social History Narrative  . Not on file     Current Outpatient Medications:  .  amLODipine (NORVASC) 5 MG tablet, Take 1 tablet (5 mg total) by mouth daily., Disp: 30 tablet, Rfl: 1 .  budesonide-formoterol ([employer]ORT) 160-4.5 MCG/ACT inhaler, Inhale 2 puffs into the lungs 2 (two) times daily., Disp: 1 Inhaler, Rfl: 2 .  meloxicam (MOBIC) 15 MG tablet, TAKE ONE TABLET BY MOUTH EVERY DAY FOR SEVEN DAYS, Disp: , Rfl: 1  No Known Allergies  ROS  Ten systems reviewed and is negative except as mentioned in HPI   Objective  Vitals:   02/06/18 1427  BP: 124/80  Pulse: 82  Temp:  98.1 F (36.7 C)  TempSrc: Oral  SpO2: 99%  Weight: 265 lb 14.4 oz (120.6 kg)   Body mass index is 37.09 kg/m.  Physical Exam Constitutional: Patient appears well-developed and well-nourished. No distress.  HENT: Head: Normocephalic and atraumatic. Ears: B TMs ok, no erythema or effusion; Nose: Nose normal. Mouth/Throat: Oropharynx is clear and moist. No oropharyngeal exudate.  Eyes: Conjunctivae and EOM are normal. Pupils are equal, round, and reactive to light. No scleral icterus.  Neck: Normal range of motion. Neck supple. No JVD present.  Cardiovascular: Normal rate, regular rhythm and normal heart sounds.  No murmur heard. Trace non-pitting BLE edema. Pulmonary/Chest: Effort normal and breath sounds normal. No respiratory distress. Abdominal: Soft. Bowel sounds are normal, no distension. There is no tenderness. no masses Musculoskeletal: Normal range of motion, no joint effusions. No gross deformities Neurological: she is alert and oriented to person, place, and time. No cranial nerve deficit. Coordination, balance, strength, speech and gait are normal. +dix-hallpike bilaterally. Skin: Skin is warm and dry. No rash noted. No erythema.  Psychiatric: Patient has a normal mood and affect. behavior is normal. Judgment and thought content normal.   No results found for this or any previous visit (from the past 72 hour(s)).     Assessment & Plan  1. Essential hypertension - lisinopril-hydrochlorothiazide (ZESTORETIC) 20-12.5 MG tablet; Take 1 tablet by mouth daily.  Dispense: 30 tablet; Refill: 0 - Discussed risk/benefit of returning to HCTZ, pt is very adamant that she wants to stop amlodipine and trial lisinopril-hctz - we will trial at low-dose HCTZ with close BP follow up in 2 weeks.  2. Vertigo - Meclizine HCl 25 MG CHEW; Chew 0.5-1 tablets (12.5-25 mg total) by mouth 2 (two) times daily as needed (Dizziness).  Dispense: 20 each; Refill: 0 - Neurologic examination is grossly  unremarkable aside from positive dix-hallpike maneuvers - I suspect benign vertigo as the cause of her symptoms, however strict return precautions including signs and symptoms of stroke, hypertension and hypotension are reviewed in detail.   3. Sinus congestion - fluticasone (FLONASE) 50 MCG/ACT nasal spray; Place 2 sprays into both nostrils daily.  Dispense: 16 g; Refill: 2 - Advised nasal congestion may contribute to dizziness, will start flonase.  4. Acute cystitis without hematuria - Urine Culture - Pt requests recheck to ensure resolution of infection, as she had dizziness in the ER with a UTI, I am happy to recheck to ensure this is not a recurring underlying cause.  Return in about 2 months (around 04/08/2018) for 2 week BP Check (NURSE ONLY)/2 month follow up with Raquel Sarna.

## 2018-02-06 NOTE — Patient Instructions (Signed)
Dizziness Dizziness is a common problem. It makes you feel unsteady or light-headed. You may feel like you are about to pass out (faint). Dizziness can lead to getting hurt if you stumble or fall. Dizziness can be caused by many things, including:  Medicines.  Not having enough water in your body (dehydration).  Illness.  Follow these instructions at home: Eating and drinking  Drink enough fluid to keep your pee (urine) clear or pale yellow. This helps to keep you from getting dehydrated. Try to drink more clear fluids, such as water.  Do not drink alcohol.  Limit how much caffeine you drink or eat, if your doctor tells you to do that.  Limit how much salt (sodium) you drink or eat, if your doctor tells you to do that. Activity  Avoid making quick movements. ? When you stand up from sitting in a chair, steady yourself until you feel okay. ? In the morning, first sit up on the side of the bed. When you feel okay, stand slowly while you hold onto something. Do this until you know that your balance is fine.  If you need to stand in one place for a long time, move your legs often. Tighten and relax the muscles in your legs while you are standing.  Do not drive or use heavy machinery if you feel dizzy.  Avoid bending down if you feel dizzy. Place items in your home so you can reach them easily without leaning over. Lifestyle  Do not use any products that contain nicotine or tobacco, such as cigarettes and e-cigarettes. If you need help quitting, ask your doctor.  Try to lower your stress level. You can do this by using methods such as yoga or meditation. Talk with your doctor if you need help. General instructions  Watch your dizziness for any changes.  Take over-the-counter and prescription medicines only as told by your doctor. Talk with your doctor if you think that you are dizzy because of a medicine that you are taking.  Tell a friend or a family member that you are feeling  dizzy. If he or she notices any changes in your behavior, have this person call your doctor.  Keep all follow-up visits as told by your doctor. This is important. Contact a doctor if:  Your dizziness does not go away.  Your dizziness or light-headedness gets worse.  You feel sick to your stomach (nauseous).  You have trouble hearing.  You have new symptoms.  You are unsteady on your feet.  You feel like the room is spinning. Get help right away if:  You throw up (vomit) or have watery poop (diarrhea), and you cannot eat or drink anything.  You have trouble: ? Talking. ? Walking. ? Swallowing. ? Using your arms, hands, or legs.  You feel generally weak.  You are not thinking clearly, or you have trouble forming sentences. A friend or family member may notice this.  You have: ? Chest pain. ? Pain in your belly (abdomen). ? Shortness of breath. ? Sweating.  Your vision changes.  You are bleeding.  You have a very bad headache.  You have neck pain or a stiff neck.  You have a fever. These symptoms may be an emergency. Do not wait to see if the symptoms will go away. Get medical help right away. Call your local emergency services (911 in the [place] Do not drive yourself to the hospital. Summary  Dizziness makes you feel unsteady or light-headed. You may  feel like you are about to pass out (faint).  Drink enough fluid to keep your pee (urine) clear or pale yellow. Do not drink alcohol.  Avoid making quick movements if you feel dizzy.  Watch your dizziness for any changes. This information is not intended to replace advice given to you by your health care provider. Make sure you discuss any questions you have with your health care provider. Document Released: 11/01/2011 Document Revised: 11/29/2016 Document Reviewed: 11/29/2016 Elsevier Interactive Patient Education  2017 [place] DASH stands for "Dietary Approaches to Stop Hypertension."  The DASH eating plan is a healthy eating plan that has been shown to reduce high blood pressure (hypertension). It may also reduce your risk for type 2 diabetes, heart disease, and stroke. The DASH eating plan may also help with weight loss. What are tips for following this plan? General guidelines  Avoid eating more than 2,300 mg (milligrams) of salt (sodium) a day. If you have hypertension, you may need to reduce your sodium intake to 1,500 mg a day.  Limit alcohol intake to no more than 1 drink a day for nonpregnant women and 2 drinks a day for men. One drink equals 12 oz of beer, 5 oz of wine, or 1 oz of hard liquor.  Work with your health care provider to maintain a healthy body weight or to lose weight. Ask what an ideal weight is for you.  Get at least 30 minutes of exercise that causes your heart to beat faster (aerobic exercise) most days of the week. Activities may include walking, swimming, or biking.  Work with your health care provider or diet and nutrition specialist (dietitian) to adjust your eating plan to your individual calorie needs. Reading food labels  Check food labels for the amount of sodium per serving. Choose foods with less than 5 percent of the Daily Value of sodium. Generally, foods with less than 300 mg of sodium per serving fit into this eating plan.  To find whole grains, look for the word "whole" as the first word in the ingredient list. Shopping  Buy products labeled as "low-sodium" or "no salt added."  Buy fresh foods. Avoid canned foods and premade or frozen meals. Cooking  Avoid adding salt when cooking. Use salt-free seasonings or herbs instead of table salt or sea salt. Check with your health care provider or pharmacist before using salt substitutes.  Do not fry foods. Cook foods using healthy methods such as baking, boiling, grilling, and broiling instead.  Cook with heart-healthy oils, such as olive, canola, soybean, or sunflower oil. Meal  planning   Eat a balanced diet that includes: ? 5 or more servings of fruits and vegetables each day. At each meal, try to fill half of your plate with fruits and vegetables. ? Up to 6-8 servings of whole grains each day. ? Less than 6 oz of lean meat, poultry, or fish each day. A 3-oz serving of meat is about the same size as a deck of cards. One egg equals 1 oz. ? 2 servings of low-fat dairy each day. ? A serving of nuts, seeds, or beans 5 times each week. ? Heart-healthy fats. Healthy fats called Omega-3 fatty acids are found in foods such as flaxseeds and coldwater fish, like sardines, salmon, and mackerel.  Limit how much you eat of the following: ? Canned or prepackaged foods. ? Food that is high in trans fat, such as fried foods. ? Food that is high in  saturated fat, such as fatty meat. ? Sweets, desserts, sugary drinks, and other foods with added sugar. ? Full-fat dairy products.  Do not salt foods before eating.  Try to eat at least 2 vegetarian meals each week.  Eat more home-cooked food and less restaurant, buffet, and fast food.  When eating at a restaurant, ask that your food be prepared with less salt or no salt, if possible. What foods are recommended? The items listed may not be a complete list. Talk with your dietitian about what dietary choices are best for you. Grains Whole-grain or whole-wheat bread. Whole-grain or whole-wheat pasta. Brown rice. [place]. Bulgur. Whole-grain and low-sodium cereals. Pita bread. Low-fat, low-sodium crackers. Whole-wheat flour tortillas. Vegetables Fresh or frozen vegetables (raw, steamed, roasted, or grilled). Low-sodium or reduced-sodium tomato and vegetable juice. Low-sodium or reduced-sodium tomato sauce and tomato paste. Low-sodium or reduced-sodium canned vegetables. Fruits All fresh, dried, or frozen fruit. Canned fruit in natural juice (without added sugar). Meat and other protein foods Skinless chicken or [place].  Ground chicken or [place]. Pork with fat trimmed off. Fish and seafood. Egg whites. Dried beans, peas, or lentils. Unsalted nuts, nut butters, and seeds. Unsalted canned beans. Lean cuts of beef with fat trimmed off. Low-sodium, lean deli meat. Dairy Low-fat (1%) or fat-free (skim) milk. Fat-free, low-fat, or reduced-fat cheeses. Nonfat, low-sodium ricotta or cottage cheese. Low-fat or nonfat yogurt. Low-fat, low-sodium cheese. Fats and oils Soft margarine without trans fats. Vegetable oil. Low-fat, reduced-fat, or light mayonnaise and salad dressings (reduced-sodium). Canola, safflower, olive, soybean, and sunflower oils. Avocado. Seasoning and other foods Herbs. Spices. Seasoning mixes without salt. Unsalted popcorn and pretzels. Fat-free sweets. What foods are not recommended? The items listed may not be a complete list. Talk with your dietitian about what dietary choices are best for you. Grains Baked goods made with fat, such as croissants, muffins, or some breads. Dry pasta or rice meal packs. Vegetables Creamed or fried vegetables. Vegetables in a cheese sauce. Regular canned vegetables (not low-sodium or reduced-sodium). Regular canned tomato sauce and paste (not low-sodium or reduced-sodium). Regular tomato and vegetable juice (not low-sodium or reduced-sodium). Angie Fava. Olives. Fruits Canned fruit in a light or heavy syrup. Fried fruit. Fruit in cream or butter sauce. Meat and other protein foods Fatty cuts of meat. Ribs. Fried meat. Berniece Salines. Sausage. Bologna and other processed lunch meats. Salami. Fatback. Hotdogs. Bratwurst. Salted nuts and seeds. Canned beans with added salt. Canned or smoked fish. Whole eggs or egg yolks. Chicken or [place] with skin. Dairy Whole or 2% milk, cream, and half-and-half. Whole or full-fat cream cheese. Whole-fat or sweetened yogurt. Full-fat cheese. Nondairy creamers. Whipped toppings. Processed cheese and cheese spreads. Fats and oils Butter. Stick  margarine. Lard. Shortening. Ghee. Bacon fat. Tropical oils, such as coconut, palm kernel, or palm oil. Seasoning and other foods Salted popcorn and pretzels. Onion salt, garlic salt, seasoned salt, table salt, and sea salt. Worcestershire sauce. Tartar sauce. Barbecue sauce. Teriyaki sauce. Soy sauce, including reduced-sodium. Steak sauce. Canned and packaged gravies. Fish sauce. Oyster sauce. Cocktail sauce. Horseradish that you find on the shelf. Ketchup. Mustard. Meat flavorings and tenderizers. Bouillon cubes. Hot sauce and Tabasco sauce. Premade or packaged marinades. Premade or packaged taco seasonings. Relishes. Regular salad dressings. Where to find more information:  National Heart, Lung, and [place]: https://wilson-eaton.com/  American Heart Association: www.heart.org Summary  The DASH eating plan is a healthy eating plan that has been shown to reduce high blood pressure (hypertension). It may also  reduce your risk for type 2 diabetes, heart disease, and stroke.  With the DASH eating plan, you should limit salt (sodium) intake to 2,300 mg a day. If you have hypertension, you may need to reduce your sodium intake to 1,500 mg a day.  When on the DASH eating plan, aim to eat more fresh fruits and vegetables, whole grains, lean proteins, low-fat dairy, and heart-healthy fats.  Work with your health care provider or diet and nutrition specialist (dietitian) to adjust your eating plan to your individual calorie needs. This information is not intended to replace advice given to you by your health care provider. Make sure you discuss any questions you have with your health care provider. Document Released: 11/01/2011 Document Revised: 11/05/2016 Document Reviewed: 11/05/2016 Elsevier Interactive Patient Education  [employer].

## 2018-02-07 LAB — URINE CULTURE
MICRO NUMBER: 90327055
RESULT: NO GROWTH
SPECIMEN QUALITY:: ADEQUATE

## 2018-02-10 ENCOUNTER — Telehealth: Payer: Self-pay | Admitting: Family Medicine

## 2018-02-10 NOTE — Telephone Encounter (Signed)
Documentation reviewed 

## 2018-02-10 NOTE — Telephone Encounter (Signed)
-----   Message from Hubbard Hartshorn, FNP sent at 02/06/2018  4:34 PM EDT ----- Regarding: Check in on pt Please call to check in on patient - how is her dizziness? How is her swelling?

## 2018-02-10 NOTE — Telephone Encounter (Signed)
Called pt, inquired about her dizziness as well as swelling and overall well being. Pt states that she is doing very well. Denies complaints.

## 2018-02-12 ENCOUNTER — Telehealth: Payer: Self-pay | Admitting: Emergency Medicine

## 2018-02-12 ENCOUNTER — Telehealth: Payer: Self-pay | Admitting: Family Medicine

## 2018-02-12 DIAGNOSIS — B3731 Acute candidiasis of vulva and vagina: Secondary | ICD-10-CM

## 2018-02-12 DIAGNOSIS — B373 Candidiasis of vulva and vagina: Secondary | ICD-10-CM

## 2018-02-12 MED ORDER — FLUCONAZOLE 150 MG PO TABS: 150.0000 mg | ORAL_TABLET | Freq: Once | ORAL | 0 refills | Status: AC

## 2018-02-12 NOTE — Telephone Encounter (Signed)
Copied from [place] [id number]. Topic: Inquiry >> Feb 12, 2018 10:05 AM Conception Chancy, NT wrote: Patient is calling and requesting to speak with Hollie Salk, she states she is experiencing a yeast infection due to a medication. She is unsure of which one could cause this. Please advise.

## 2018-02-12 NOTE — Telephone Encounter (Signed)
Having recurrent yeast infection from antibiotics. Can diflucan be called in or at some point do she need to come back in to have labs to see why recurrent

## 2018-02-12 NOTE — Telephone Encounter (Signed)
Spoke to patient and gave Raquel Sarna the message

## 2018-02-12 NOTE — Telephone Encounter (Signed)
Pt has recent UTI and was treated with ABX. Diflucan x1 is sent in.  If not resolving, she needs to come in to be seen.

## 2018-02-12 NOTE — Telephone Encounter (Signed)
Patient notified

## 2018-02-20 ENCOUNTER — Ambulatory Visit: Payer: Self-pay

## 2018-04-08 ENCOUNTER — Ambulatory Visit: Payer: Self-pay | Admitting: Nurse Practitioner

## 2018-04-30 ENCOUNTER — Emergency Department
Admission: EM | Admit: 2018-04-30 | Discharge: 2018-04-30 | Disposition: A | Discharge status: Home / Self Care | Payer: Self-pay | Attending: Emergency Medicine | Admitting: Emergency Medicine

## 2018-04-30 ENCOUNTER — Other Ambulatory Visit: Payer: Self-pay

## 2018-04-30 DIAGNOSIS — J449 Chronic obstructive pulmonary disease, unspecified: Secondary | ICD-10-CM | POA: Insufficient documentation

## 2018-04-30 DIAGNOSIS — R42 Dizziness and giddiness: Secondary | ICD-10-CM | POA: Insufficient documentation

## 2018-04-30 DIAGNOSIS — F17210 Nicotine dependence, cigarettes, uncomplicated: Secondary | ICD-10-CM | POA: Insufficient documentation

## 2018-04-30 DIAGNOSIS — R519 Headache, unspecified: Secondary | ICD-10-CM

## 2018-04-30 DIAGNOSIS — I10 Essential (primary) hypertension: Secondary | ICD-10-CM | POA: Insufficient documentation

## 2018-04-30 DIAGNOSIS — Z79899 Other long term (current) drug therapy: Secondary | ICD-10-CM | POA: Insufficient documentation

## 2018-04-30 DIAGNOSIS — R51 Headache: Secondary | ICD-10-CM | POA: Insufficient documentation

## 2018-04-30 LAB — COMPREHENSIVE METABOLIC PANEL
ALBUMIN: 3.7 g/dL (ref 3.5–5.0)
ALT: 21 U/L (ref 14–54)
AST: 21 U/L (ref 15–41)
Alkaline Phosphatase: 58 U/L (ref 38–126)
Anion gap: 7 (ref 5–15)
BUN: 13 mg/dL (ref 6–20)
CHLORIDE: 106 mmol/L (ref 101–111)
CO2: 26 mmol/L (ref 22–32)
Calcium: 8.9 mg/dL (ref 8.9–10.3)
Creatinine, Ser: 0.93 mg/dL (ref 0.44–1.00)
GFR calc Af Amer: >60 mL/min (ref >60)
GFR calc non Af Amer: >60 mL/min (ref >60)
Glucose, Bld: 102 mg/dL — ABNORMAL HIGH (ref 65–99)
POTASSIUM: 3.6 mmol/L (ref 3.5–5.1)
Sodium: 139 mmol/L (ref 135–145)
Total Bilirubin: 0.4 mg/dL (ref 0.3–1.2)
Total Protein: 7.1 g/dL (ref 6.5–8.1)

## 2018-04-30 LAB — URINALYSIS, ROUTINE W REFLEX MICROSCOPIC
Bilirubin Urine: NEGATIVE
Glucose, UA: NEGATIVE mg/dL
KETONES UR: NEGATIVE mg/dL
Leukocytes, UA: NEGATIVE
Nitrite: NEGATIVE
PH: 6 (ref 5.0–8.0)
Protein, ur: NEGATIVE mg/dL
Specific Gravity, Urine: 1.018 (ref 1.005–1.030)

## 2018-04-30 LAB — CBC WITH DIFFERENTIAL/PLATELET
Basophils Absolute: 0 10*3/uL (ref 0–0.1)
Basophils Relative: 0 %
EOS PCT: 2 %
Eosinophils Absolute: 0.2 10*3/uL (ref 0–0.7)
HCT: 40.1 % (ref 35.0–47.0)
Hemoglobin: 13.6 g/dL (ref 12.0–16.0)
LYMPHS ABS: 2.7 10*3/uL (ref 1.0–3.6)
LYMPHS PCT: 38 %
MCH: 32.5 pg (ref 26.0–34.0)
MCHC: 33.9 g/dL (ref 32.0–36.0)
MCV: 95.7 fL (ref 80.0–100.0)
MONO ABS: 0.4 10*3/uL (ref 0.2–0.9)
MONOS PCT: 5 %
Neutro Abs: 3.9 10*3/uL (ref 1.4–6.5)
Neutrophils Relative %: 55 %
PLATELETS: 273 10*3/uL (ref 150–440)
RBC: 4.19 MIL/uL (ref 3.80–5.20)
RDW: 13.8 % (ref 11.5–14.5)
WBC: 7.2 10*3/uL (ref 3.6–11.0)

## 2018-04-30 LAB — TROPONIN I: Troponin I: <0.03 ng/mL (ref <0.03)

## 2018-04-30 MED ORDER — BUTALBITAL-APAP-CAFFEINE 50-325-40 MG PO TABS
ORAL_TABLET | ORAL | Status: AC
  Filled 2018-04-30: qty 2

## 2018-04-30 MED ORDER — BUTALBITAL-APAP-CAFFEINE 50-325-40 MG PO TABS
2.0000 | ORAL_TABLET | Freq: Once | ORAL | Status: AC
Start: 2018-04-30 — End: 2018-04-30
  Administered 2018-04-30: 2 via ORAL

## 2018-04-30 NOTE — ED Notes (Signed)
ED Provider at bedside. 

## 2018-04-30 NOTE — ED Provider Notes (Signed)
[facility] Emergency Department Provider Note  Time seen: 2:34 PM  I have reviewed the triage vital signs and the nursing notes.   HISTORY  Chief Complaint Headache and Blurred Vision    HPI Joan Davis is a 52 y.o. female with a past medical history of COPD, hypertension, vertigo, presents to the emergency department for 2 days of headache and dizziness.  According to the patient for the past 2 days she has had a mild headache, checked her blood pressure and blood pressure was elevated a little bit.  Patient states she is also been experiencing some dizziness but has a history of vertigo and is not sure if it is the vertigo causing her dizziness with a headache.  No confusion or slurred speech, weakness or numbness.  Denies fever, nausea, vomiting, diarrhea.  Patient states she was concerned today so she went to her PCP was only able to see the nurse but her blood pressure was elevated once again while there, patient states she came to the emergency department for evaluation.  Currently states that headache is down to about a 4/10, mild headache.  States some mild photophobia.   Past Medical History:  Diagnosis Date  . COPD (chronic obstructive pulmonary disease) ([place])   . Hypertension     Patient Active Problem List   Diagnosis Date Noted  . Vertigo 02/06/2018  . COPD (chronic obstructive pulmonary disease) ([place]) 12/20/2017  . Hypertension 12/20/2017  . Food impaction of esophagus     Past Surgical History:  Procedure Laterality Date  . CHOLECYSTECTOMY    . FOREIGN BODY REMOVAL N/A 07/15/2016   Procedure: FOREIGN BODY REMOVAL;  Surgeon: Lucilla Lame, MD;  Location: [facility] ENDOSCOPY;  Service: Endoscopy;  Laterality: N/A;  . TOTAL ABDOMINAL HYSTERECTOMY      Prior to Admission medications   Medication Sig Start Date End Date Taking? Authorizing Provider  budesonide-formoterol (SYMBICORT) 160-4.5 MCG/ACT inhaler Inhale 2 puffs into the lungs 2 (two)  times daily. 12/20/17   Hubbard Hartshorn, FNP  fluticasone (FLONASE) 50 MCG/ACT nasal spray Place 2 sprays into both nostrils daily. 02/06/18   Hubbard Hartshorn, FNP  lisinopril-hydrochlorothiazide (ZESTORETIC) 20-12.5 MG tablet Take 1 tablet by mouth daily. 02/06/18   Hubbard Hartshorn, FNP  Meclizine HCl 25 MG CHEW Chew 0.5-1 tablets (12.5-25 mg total) by mouth 2 (two) times daily as needed (Dizziness). 02/06/18   Hubbard Hartshorn, FNP  meloxicam (MOBIC) 15 MG tablet TAKE ONE TABLET BY MOUTH EVERY DAY FOR SEVEN DAYS 01/31/18   [provider]    No Known Allergies  Family History  Problem Relation Age of Onset  . Alcohol abuse Mother   . Heart disease Father   . Hypertension Father   . Hypertension Brother   . Asthma Maternal Grandmother   . Stroke Maternal Grandfather   . Hypertension Maternal Grandfather   . Congestive Heart Failure Paternal Grandmother   . Heart disease Paternal Grandfather   . Congestive Heart Failure Paternal Grandfather     Social History Social History   Tobacco Use  . Smoking status: Current Every Day Smoker    Packs/day: 0.50    Types: Cigarettes  . Smokeless tobacco: Never Used  Substance Use Topics  . Alcohol use: No  . Drug use: No    Review of Systems Constitutional: Negative for fever. Eyes: States mild blurred vision yesterday, photophobia today. ENT: Negative for recent illness/congestion Cardiovascular: Negative for chest pain. Respiratory: Negative for shortness of breath.  Gastrointestinal: Negative for abdominal pain, vomiting and diarrhea. Genitourinary: Negative for urinary compaints Musculoskeletal: Negative for musculoskeletal complaints Skin: Negative for skin complaints  Neurological: Moderate headache. All other ROS negative  ____________________________________________   PHYSICAL EXAM:  VITAL SIGNS: ED Triage Vitals [04/30/18 1220]  Enc Vitals Group     BP (!) 161/82     Pulse Rate 62     Resp 18     Temp 98.5 F  (36.9 C)     Temp Source Oral     SpO2 99 %     Weight 262 lb (118.8 kg)     Height 6' (1.829 m)     Head Circumference      Peak Flow      Pain Score 7     Pain Loc      Pain Edu?      Excl. in [place]?    Constitutional: Alert and oriented. Well appearing and in no distress. Eyes: Normal exam ENT   Head: Normocephalic and atraumatic.   Mouth/Throat: Mucous membranes are moist. Cardiovascular: Normal rate, regular rhythm. No murmur Respiratory: Normal respiratory effort without tachypnea nor retractions. Breath sounds are clear Gastrointestinal: Soft and nontender. No distention.  Musculoskeletal: Nontender with normal range of motion in all extremities.  Neurologic:  Normal speech and language. No gross focal neurologic deficits.  Equal grip strength bilaterally.  No pronator drift.  Cranial nerves intact. Skin:  Skin is warm, dry and intact.  Psychiatric: Mood and affect are normal.   ____________________________________________    EKG  EKG reviewed and interpreted by myself shows what appears to be a sinus rhythm at 62 bpm with a narrow QRS, normal axis, largely normal intervals with the patient is having fairly frequent PVCs.  ____________________________________________   INITIAL IMPRESSION / ASSESSMENT AND PLAN / ED COURSE  Pertinent labs & imaging results that were available during my care of the patient were reviewed by me and considered in my medical decision making (see chart for details).  Patient presents to the emergency department for headache and dizziness also concerned that her blood pressure is elevated.  Currently describes a headache as mild to moderate 4/10.  Blood pressure is 161/82.  Denies any fever.  Intact and normal neurological exam.  Overall very well-appearing.  Patient's basic lab work has come back showing largely normal results.  I have added on a urinalysis and a troponin.  We will treat the patient's headache with Fioricet while awaiting  results.  Patient agreeable to plan of care.  Patient states that headache is better after medication.  Continues to have mild dizziness.  Blood pressure remains elevated around 160/90.  Overall the patient appears well, the urine is normal.  Labs are reassuring.  Troponin negative.  We will discharge patient home, she will follow-up with her doctor in the next 2 days for recheck.  I discussed return precautions for any worsening dizziness or development of chest pain or trouble breathing.  Patient agreeable to plan of care.  ____________________________________________   FINAL CLINICAL IMPRESSION(S) / ED DIAGNOSES  Headache Dizziness    Harvest Dark, MD 04/30/18 1540

## 2018-04-30 NOTE — ED Triage Notes (Signed)
Pt alert, oriented, ambulatory. States HA and blurred vision and dizziness that began yesterday. States hx HTN and vertigo. States bilat blurred vision but more in right. Also c/o double vision. HA at L temporal. Denies blood thinner use. Denies falls. Speaking in complete sentences. Wearing glasses.

## 2018-05-02 ENCOUNTER — Ambulatory Visit: Payer: Self-pay | Admitting: Nurse Practitioner

## 2018-07-24 ENCOUNTER — Telehealth: Payer: Self-pay | Admitting: Emergency Medicine

## 2018-07-24 NOTE — Telephone Encounter (Signed)
No refills until she can be seen - last refill from me was 02/06/2018 and it was for 30 days - she appears to be non-compliant with her medication regimen and needs evaluation.

## 2018-07-24 NOTE — Telephone Encounter (Signed)
Would like refill on HCTZ to last for 2 weeks until she can get off for appointment. [facility]

## 2018-07-24 NOTE — Telephone Encounter (Signed)
Patient notified

## 2018-07-25 ENCOUNTER — Ambulatory Visit: Payer: 59 | Admitting: Family Medicine

## 2018-07-25 ENCOUNTER — Encounter: Payer: Self-pay | Admitting: Family Medicine

## 2018-07-25 VITALS — BP 124/82 | HR 97 | Temp 98.0°F | Resp 12 | Ht 71.0 in | Wt 262.7 lb

## 2018-07-25 DIAGNOSIS — J449 Chronic obstructive pulmonary disease, unspecified: Secondary | ICD-10-CM

## 2018-07-25 DIAGNOSIS — I10 Essential (primary) hypertension: Secondary | ICD-10-CM | POA: Diagnosis not present

## 2018-07-25 DIAGNOSIS — M1712 Unilateral primary osteoarthritis, left knee: Secondary | ICD-10-CM

## 2018-07-25 DIAGNOSIS — Z23 Encounter for immunization: Secondary | ICD-10-CM | POA: Diagnosis not present

## 2018-07-25 DIAGNOSIS — Z716 Tobacco abuse counseling: Secondary | ICD-10-CM

## 2018-07-25 DIAGNOSIS — Z114 Encounter for screening for human immunodeficiency virus [HIV]: Secondary | ICD-10-CM | POA: Diagnosis not present

## 2018-07-25 DIAGNOSIS — S8991XA Unspecified injury of right lower leg, initial encounter: Secondary | ICD-10-CM

## 2018-07-25 DIAGNOSIS — R1310 Dysphagia, unspecified: Secondary | ICD-10-CM

## 2018-07-25 DIAGNOSIS — Z1231 Encounter for screening mammogram for malignant neoplasm of breast: Secondary | ICD-10-CM

## 2018-07-25 DIAGNOSIS — Z6836 Body mass index (BMI) 36.0-36.9, adult: Secondary | ICD-10-CM

## 2018-07-25 DIAGNOSIS — Z791 Long term (current) use of non-steroidal anti-inflammatories (NSAID): Secondary | ICD-10-CM

## 2018-07-25 DIAGNOSIS — Z13220 Encounter for screening for lipoid disorders: Secondary | ICD-10-CM

## 2018-07-25 DIAGNOSIS — Z131 Encounter for screening for diabetes mellitus: Secondary | ICD-10-CM

## 2018-07-25 DIAGNOSIS — Z1239 Encounter for other screening for malignant neoplasm of breast: Secondary | ICD-10-CM

## 2018-07-25 MED ORDER — NICOTINE 21-14-7 MG/24HR TD KIT: 1.0000 | PACK | Freq: Every day | TRANSDERMAL | 0 refills | Status: DC

## 2018-07-25 MED ORDER — BUDESONIDE-FORMOTEROL FUMARATE 160-4.5 MCG/ACT IN AERO: 2.0000 | INHALATION_SPRAY | Freq: Two times a day (BID) | RESPIRATORY_TRACT | 2 refills | Status: DC

## 2018-07-25 MED ORDER — HYDROCHLOROTHIAZIDE 25 MG PO TABS: 25.0000 mg | ORAL_TABLET | Freq: Every day | ORAL | 0 refills | Status: DC

## 2018-07-25 MED ORDER — MELOXICAM 15 MG PO TABS: ORAL_TABLET | ORAL | 0 refills | Status: DC

## 2018-07-25 NOTE — Patient Instructions (Addendum)
Go to [facility] 1p-7p Monday through Friday next week.  I have also placed a referral to [facility] for you.   [email or web address] - sign up for seminar first to be considered for the bariatric surgery program.   I strongly recommend you quit smoking If you would like start a free smoking cessation class, you can call [phone] Call 1-800-QUIT-NOW if you would like to speak with a quit coach   DASH Eating Plan DASH stands for "Dietary Approaches to Stop Hypertension." The DASH eating plan is a healthy eating plan that has been shown to reduce high blood pressure (hypertension). It may also reduce your risk for type 2 diabetes, heart disease, and stroke. The DASH eating plan may also help with weight loss. What are tips for following this plan? General guidelines  Avoid eating more than 2,300 mg (milligrams) of salt (sodium) a day. If you have hypertension, you may need to reduce your sodium intake to 1,500 mg a day.  Limit alcohol intake to no more than 1 drink a day for nonpregnant women and 2 drinks a day for men. One drink equals 12 oz of beer, 5 oz of wine, or 1 oz of hard liquor.  Work with your health care provider to maintain a healthy body weight or to lose weight. Ask what an ideal weight is for you.  Get at least 30 minutes of exercise that causes your heart to beat faster (aerobic exercise) most days of the week. Activities may include walking, swimming, or biking.  Work with your health care provider or diet and nutrition specialist (dietitian) to adjust your eating plan to your individual calorie needs. Reading food labels  Check food labels for the amount of sodium per serving. Choose foods with less than 5 percent of the Daily Value of sodium. Generally, foods with less than 300 mg of sodium per serving fit into this eating plan.  To find whole grains, look for the word "whole" as the first word in the ingredient list. Shopping  Buy  products labeled as "low-sodium" or "no salt added."  Buy fresh foods. Avoid canned foods and premade or frozen meals. Cooking  Avoid adding salt when cooking. Use salt-free seasonings or herbs instead of table salt or sea salt. Check with your health care provider or pharmacist before using salt substitutes.  Do not fry foods. Cook foods using healthy methods such as baking, boiling, grilling, and broiling instead.  Cook with heart-healthy oils, such as olive, canola, soybean, or sunflower oil. Meal planning   Eat a balanced diet that includes: ? 5 or more servings of fruits and vegetables each day. At each meal, try to fill half of your plate with fruits and vegetables. ? Up to 6-8 servings of whole grains each day. ? Less than 6 oz of lean meat, poultry, or fish each day. A 3-oz serving of meat is about the same size as a deck of cards. One egg equals 1 oz. ? 2 servings of low-fat dairy each day. ? A serving of nuts, seeds, or beans 5 times each week. ? Heart-healthy fats. Healthy fats called Omega-3 fatty acids are found in foods such as flaxseeds and coldwater fish, like sardines, salmon, and mackerel.  Limit how much you eat of the following: ? Canned or prepackaged foods. ? Food that is high in trans fat, such as fried foods. ? Food that is high in saturated fat, such as fatty meat. ? Sweets, desserts, sugary drinks, and other  foods with added sugar. ? Full-fat dairy products.  Do not salt foods before eating.  Try to eat at least 2 vegetarian meals each week.  Eat more home-cooked food and less restaurant, buffet, and fast food.  When eating at a restaurant, ask that your food be prepared with less salt or no salt, if possible. What foods are recommended? The items listed may not be a complete list. Talk with your dietitian about what dietary choices are best for you. Grains Whole-grain or whole-wheat bread. Whole-grain or whole-wheat pasta. Brown rice. [place]row.  Bulgur. Whole-grain and low-sodium cereals. Pita bread. Low-fat, low-sodium crackers. Whole-wheat flour tortillas. Vegetables Fresh or frozen vegetables (raw, steamed, roasted, or grilled). Low-sodium or reduced-sodium tomato and vegetable juice. Low-sodium or reduced-sodium tomato sauce and tomato paste. Low-sodium or reduced-sodium canned vegetables. Fruits All fresh, dried, or frozen fruit. Canned fruit in natural juice (without added sugar). Meat and other protein foods Skinless chicken or [place]. Ground chicken or [place]. Pork with fat trimmed off. Fish and seafood. Egg whites. Dried beans, peas, or lentils. Unsalted nuts, nut butters, and seeds. Unsalted canned beans. Lean cuts of beef with fat trimmed off. Low-sodium, lean deli meat. Dairy Low-fat (1%) or fat-free (skim) milk. Fat-free, low-fat, or reduced-fat cheeses. Nonfat, low-sodium ricotta or cottage cheese. Low-fat or nonfat yogurt. Low-fat, low-sodium cheese. Fats and oils Soft margarine without trans fats. Vegetable oil. Low-fat, reduced-fat, or light mayonnaise and salad dressings (reduced-sodium). Canola, safflower, olive, soybean, and sunflower oils. Avocado. Seasoning and other foods Herbs. Spices. Seasoning mixes without salt. Unsalted popcorn and pretzels. Fat-free sweets. What foods are not recommended? The items listed may not be a complete list. Talk with your dietitian about what dietary choices are best for you. Grains Baked goods made with fat, such as croissants, muffins, or some breads. Dry pasta or rice meal packs. Vegetables Creamed or fried vegetables. Vegetables in a cheese sauce. Regular canned vegetables (not low-sodium or reduced-sodium). Regular canned tomato sauce and paste (not low-sodium or reduced-sodium). Regular tomato and vegetable juice (not low-sodium or reduced-sodium). Angie Fava. Olives. Fruits Canned fruit in a light or heavy syrup. Fried fruit. Fruit in cream or butter sauce. Meat and other  protein foods Fatty cuts of meat. Ribs. Fried meat. Berniece Salines. Sausage. Bologna and other processed lunch meats. Salami. Fatback. Hotdogs. Bratwurst. Salted nuts and seeds. Canned beans with added salt. Canned or smoked fish. Whole eggs or egg yolks. Chicken or [place] with skin. Dairy Whole or 2% milk, cream, and half-and-half. Whole or full-fat cream cheese. Whole-fat or sweetened yogurt. Full-fat cheese. Nondairy creamers. Whipped toppings. Processed cheese and cheese spreads. Fats and oils Butter. Stick margarine. Lard. Shortening. Ghee. Bacon fat. Tropical oils, such as coconut, palm kernel, or palm oil. Seasoning and other foods Salted popcorn and pretzels. Onion salt, garlic salt, seasoned salt, table salt, and sea salt. Worcestershire sauce. Tartar sauce. Barbecue sauce. Teriyaki sauce. Soy sauce, including reduced-sodium. Steak sauce. Canned and packaged gravies. Fish sauce. Oyster sauce. Cocktail sauce. Horseradish that you find on the shelf. Ketchup. Mustard. Meat flavorings and tenderizers. Bouillon cubes. Hot sauce and Tabasco sauce. Premade or packaged marinades. Premade or packaged taco seasonings. Relishes. Regular salad dressings. Where to find more information:  National Heart, Lung, and [place]: https://wilson-eaton.com/  American Heart Association: www.heart.org Summary  The DASH eating plan is a healthy eating plan that has been shown to reduce high blood pressure (hypertension). It may also reduce your risk for type 2 diabetes, heart disease, and stroke.  With  the DASH eating plan, you should limit salt (sodium) intake to 2,300 mg a day. If you have hypertension, you may need to reduce your sodium intake to 1,500 mg a day.  When on the DASH eating plan, aim to eat more fresh fruits and vegetables, whole grains, lean proteins, low-fat dairy, and heart-healthy fats.  Work with your health care provider or diet and nutrition specialist (dietitian) to adjust your eating plan to your  individual calorie needs. This information is not intended to replace advice given to you by your health care provider. Make sure you discuss any questions you have with your health care provider. Document Released: 11/01/2011 Document Revised: 11/05/2016 Document Reviewed: 11/05/2016 Elsevier Interactive Patient Education  [employer].

## 2018-07-25 NOTE — Progress Notes (Signed)
Name: Joan Davis   MRN: [id number]    DOB: [birth year]   Date:07/25/2018       Progress Note  Subjective   Chief Complaint  Chief Complaint  Patient presents with  . Follow-up  . Hypertension  . Medication Refill    HCTZ  . Obesity    talk about bariatric surgery?  . Knee Pain    right knee twisted with swelling  . Nicotine Dependence    rx for patches to help stop smoking    HPI  Pt presents for follow up.  HTN:  -does take medications as prescribed - current regimen includes HCTZ 41m daily.  - taking medications as instructed, no medication side effects noted, no TIAs, no chest pain on exertion, no dyspnea on exertion, noting swelling of ankles. - DASH diet discussed - pt does follow a low sodium diet; salt not added to cooking and salt shaker not on table - The following  are contributing factors: stress, sedentary lifestyle, corticosteroid use, saturated fats in diet, smoking.  Obesity: Body mass index is 36.64 kg/m. Weight Management History: Diet: She is trying to decrease portion sizes, eating more fresh vegetables or fruits. Exercise: moderately active Co-Morbid Conditions: hypertension; 2 or more of these conditions combined with BMI >30 is considered morbid obesity; is this diagnosis appropriate and/or added to patient's problem list? No  Dysphagia: She had an episode of choking on meat last year - it was discussed then that she may need esophageal stretching, however she has not had insurance and did not follow up.  She reports continuing to have sensation of food being lodged ocassionally; no recent choking episodes  Smoking: She currently smokes .5 packs per day. (was smoking 1ppd - started decreasing a few months ago) Cessation Techniques Discussed: removing cigarettes and smoking materials from environment, stress management, support of family/friends, written materials and local smoking cessation programs (Quit line discussed) I advised patient to  quit, and offered support. Nicotine patches beginning at 21 mg. I spent approximately 5 minutes counseling the patient.  Chronic Knee Pain/RIGHT Knee Pain with acute injury: Chronic left knee pain - ran out of meloxicam.  3 weeks ago she twisted her knee while on vacation and felt a "pop".  She endorses tenderness just proximal to the knee and antalgic gait.  COPD: She is not using symbicort - it was too expensive without insurance, can afford now and will provide refill.  Some non-productive cough, worse first thing in the morning.  Very occasional shortness of breath.  Patient Active Problem List   Diagnosis Date Noted  . Vertigo 02/06/2018  . COPD (chronic obstructive pulmonary disease) ([place] 12/20/2017  . Hypertension 12/20/2017  . Food impaction of esophagus     Past Surgical History:  Procedure Laterality Date  . CHOLECYSTECTOMY    . FOREIGN BODY REMOVAL N/A 07/15/2016   Procedure: FOREIGN BODY REMOVAL;  Surgeon: DLucilla Lame MD;  Location: [facility] ENDOSCOPY;  Service: Endoscopy;  Laterality: N/A;  . TOTAL ABDOMINAL HYSTERECTOMY     Family History  Problem Relation Age of Onset  . Alcohol abuse Mother   . Heart disease Father   . Hypertension Father   . Hypertension Brother   . Hyperlipidemia Brother   . Asthma Maternal Grandmother   . Stroke Maternal Grandfather   . Hypertension Maternal Grandfather   . Congestive Heart Failure Paternal Grandmother   . Heart disease Paternal Grandfather   . Congestive Heart Failure Paternal Grandfather     Social  History   Socioeconomic History  . Marital status: Single    Spouse name: Not on file  . Number of children: Not on file  . Years of education: Not on file  . Highest education level: Not on file  Occupational History  . Not on file  Social Needs  . Financial resource strain: Not on file  . Food insecurity:    Worry: Not on file    Inability: Not on file  . Transportation needs:    Medical: Not on file     Non-medical: Not on file  Tobacco Use  . Smoking status: Current Every Day Smoker    Packs/day: 0.50    Types: Cigarettes  . Smokeless tobacco: Never Used  Substance and Sexual Activity  . Alcohol use: No  . Drug use: No  . Sexual activity: Yes  Lifestyle  . Physical activity:    Days per week: Not on file    Minutes per session: Not on file  . Stress: Not on file  Relationships  . Social connections:    Talks on phone: Not on file    Gets together: Not on file    Attends religious service: Not on file    Active member of club or organization: Not on file    Attends meetings of clubs or organizations: Not on file    Relationship status: Not on file  . Intimate partner violence:    Fear of current or ex partner: Not on file    Emotionally abused: Not on file    Physically abused: Not on file    Forced sexual activity: Not on file  Other Topics Concern  . Not on file  Social History Narrative  . Not on file     Current Outpatient Medications:  .  budesonide-formoterol (SYMBICORT) 160-4.5 MCG/ACT inhaler, Inhale 2 puffs into the lungs 2 (two) times daily., Disp: 1 Inhaler, Rfl: 2 .  fluticasone (FLONASE) 50 MCG/ACT nasal spray, Place 2 sprays into both nostrils daily., Disp: 16 g, Rfl: 2 .  Meclizine HCl 25 MG CHEW, Chew 0.5-1 tablets (12.5-25 mg total) by mouth 2 (two) times daily as needed (Dizziness)., Disp: 20 each, Rfl: 0 .  hydrochlorothiazide (HYDRODIURIL) 25 MG tablet, Take 25 mg by mouth daily., Disp: , Rfl:  .  lisinopril-hydrochlorothiazide (ZESTORETIC) 20-12.5 MG tablet, Take 1 tablet by mouth daily. (Patient not taking: Reported on 07/25/2018), Disp: 30 tablet, Rfl: 0 .  meloxicam (MOBIC) 15 MG tablet, TAKE ONE TABLET BY MOUTH EVERY DAY FOR SEVEN DAYS, Disp: , Rfl: 1  No Known Allergies  ROS Ten systems reviewed and is negative except as mentioned in HPI.  Objective  Vitals:   07/25/18 1426  BP: 124/82  Pulse: 97  Resp: 12  Temp: 98 F (36.7 C)    TempSrc: Oral  SpO2: 98%  Weight: 262 lb 11.2 oz (119.2 kg)  Height: '5\' 11"'  (1.803 m)   Body mass index is 36.64 kg/m.  Physical Exam Constitutional: Patient appears well-developed and well-nourished. No distress.  HENT: Head: Normocephalic and atraumatic. Ears: B TMs ok, no erythema or effusion; Nose: Nose normal. Mouth/Throat: Oropharynx is clear and moist. No oropharyngeal exudate.  Eyes: Conjunctivae and EOM are normal. Pupils are equal, round, and reactive to light. No scleral icterus.  Neck: Normal range of motion. Neck supple. No JVD present. No thyromegaly present.  Cardiovascular: Normal rate, regular rhythm and normal heart sounds.  No murmur heard. No BLE edema. Pulmonary/Chest: Effort normal and breath sounds  normal. No respiratory distress. Abdominal: Soft. Bowel sounds are normal, no distension. There is no tenderness. no masses Musculoskeletal: Normal range of motion, no joint effusions. No gross deformities Neurological: he is alert and oriented to person, place, and time. No cranial nerve deficit. Coordination, balance, strength, speech and gait are normal.  Skin: Skin is warm and dry. No rash noted. No erythema.  Psychiatric: Patient has a normal mood and affect. behavior is normal. Judgment and thought content normal.  No results found for this or any previous visit (from the past 72 hour(s)).   PHQ2/9: Depression screen [facility] 2/9 07/25/2018 12/20/2017  Decreased Interest 0 0  Down, Depressed, Hopeless 0 0  PHQ - 2 Score 0 0   Fall Risk: Fall Risk  07/25/2018 12/20/2017  Falls in the past year? No No   Functional Status Survey: Is the patient deaf or have difficulty hearing?: No Does the patient have difficulty seeing, even when wearing glasses/contacts?: No Does the patient have difficulty concentrating, remembering, or making decisions?: No Does the patient have difficulty walking or climbing stairs?: No Does the patient have difficulty dressing or bathing?:  No Does the patient have difficulty doing errands alone such as visiting a doctor's office or shopping?: No   Assessment & Plan  1. Essential hypertension - DASH diet discussed, Stable and at goal today. - hydrochlorothiazide (HYDRODIURIL) 25 MG tablet; Take 1 tablet (25 mg total) by mouth daily.  Dispense: 90 tablet; Refill: 0  2. Class 2 severe obesity due to excess calories with serious comorbidity and body mass index (BMI) of 36.0 to 36.9 in adult [facility]) - Discussed importance of 150 minutes of physical activity weekly, eat two servings of fish weekly, eat one serving of tree nuts ( cashews, pistachios, pecans, almonds.Marland Kitchen) every other day, eat 6 servings of fruit/vegetables daily and drink plenty of water and avoid sweet beverages.  - Amb Referral to Bariatric Surgery - advised to go online for seminar sign up first. - Hemoglobin A1c  3. Arthritis of left knee - Meloxicam per orders.  4. Screening for HIV (human immunodeficiency virus) - HIV antibody (with reflex)  5. Need for Tdap vaccination - Tdap vaccine greater than or equal to 7yo IM  6. Screening for breast cancer - MM 3D SCREEN BREAST BILATERAL; Future  7. Chronic obstructive pulmonary disease, unspecified COPD type (HCC) - budesonide-formoterol (SYMBICORT) 160-4.5 MCG/ACT inhaler; Inhale 2 puffs into the lungs 2 (two) times daily.  Dispense: 1 Inhaler; Refill: 2 - PR EVAL OF BRONCHOSPASM - Shows "Lung Age" of 52yo, %Predicted Pre and Post has only 9% improvement.  Advised smoking cessation and continued use of symbicort daily.  8. Dysphagia, unspecified type - Ambulatory referral to Gastroenterology  9. Injury of right knee, initial encounter - meloxicam (MOBIC) 15 MG tablet; TAKE ONE TABLET BY MOUTH EVERY DAY FOR SEVEN DAYS  Dispense: 20 tablet; Refill: 0 - Ambulatory referral to Orthopedic Surgery - Advised to go to walk-in clinic at [facility]  10. Encounter for tobacco use cessation counseling - Nicotine  21-14-7 MG/24HR KIT; Place 1 patch onto the skin daily.  Dispense: 56 each; Refill: 0  11. Long term (current) use of non-steroidal anti-inflammatories (nsaid) - COMPLETE METABOLIC PANEL WITH GFR - CBC  12. Lipid screening - Lipid panel  13. Diabetes mellitus screening - COMPLETE METABOLIC PANEL WITH GFR - Hemoglobin A1c

## 2018-07-27 LAB — HEMOGLOBIN A1C
HEMOGLOBIN A1C: 5.3 %{Hb} (ref <5.7)
Mean Plasma Glucose: 105 (calc)
eAG (mmol/L): 5.8 (calc)

## 2018-07-27 LAB — LIPID PANEL
Cholesterol: 200 mg/dL — ABNORMAL HIGH (ref <200)
HDL: 49 mg/dL — ABNORMAL LOW (ref >50)
LDL Cholesterol (Calc): 130 mg/dL (calc) — ABNORMAL HIGH
Non-HDL Cholesterol (Calc): 151 mg/dL (calc) — ABNORMAL HIGH (ref <130)
Total CHOL/HDL Ratio: 4.1 (calc) (ref <5.0)
Triglycerides: 99 mg/dL (ref <150)

## 2018-07-27 LAB — COMPLETE METABOLIC PANEL WITH GFR
AG Ratio: 1.3 (calc) (ref 1.0–2.5)
ALT: 12 U/L (ref 6–29)
AST: 15 U/L (ref 10–35)
Albumin: 3.9 g/dL (ref 3.6–5.1)
Alkaline phosphatase (APISO): 66 U/L (ref 33–130)
BUN: 14 mg/dL (ref 7–25)
CO2: 26 mmol/L (ref 20–32)
Calcium: 9.6 mg/dL (ref 8.6–10.4)
Chloride: 108 mmol/L (ref 98–110)
Creat: 0.86 mg/dL (ref 0.50–1.05)
GFR, Est African American: 91 mL/min/{1.73_m2} (ref >=60)
GFR, Est Non African American: 78 mL/min/{1.73_m2} (ref >=60)
Globulin: 3 g/dL (calc) (ref 1.9–3.7)
Glucose, Bld: 92 mg/dL (ref 65–139)
Potassium: 4.1 mmol/L (ref 3.5–5.3)
Sodium: 141 mmol/L (ref 135–146)
Total Bilirubin: 0.4 mg/dL (ref 0.2–1.2)
Total Protein: 6.9 g/dL (ref 6.1–8.1)

## 2018-07-27 LAB — CBC
HCT: 39.2 % (ref 35.0–45.0)
Hemoglobin: 13 g/dL (ref 11.7–15.5)
MCH: 30.5 pg (ref 27.0–33.0)
MCHC: 33.2 g/dL (ref 32.0–36.0)
MCV: 92 fL (ref 80.0–100.0)
MPV: 10.1 fL (ref 7.5–12.5)
Platelets: 359 10*3/uL (ref 140–400)
RBC: 4.26 10*6/uL (ref 3.80–5.10)
RDW: 12.5 % (ref 11.0–15.0)
WBC: 9.1 10*3/uL (ref 3.8–10.8)

## 2018-07-27 LAB — HIV ANTIBODY (ROUTINE TESTING W REFLEX): HIV 1&2 Ab, 4th Generation: NONREACTIVE

## 2018-07-29 ENCOUNTER — Other Ambulatory Visit: Payer: Self-pay | Admitting: Family Medicine

## 2018-07-29 DIAGNOSIS — E782 Mixed hyperlipidemia: Secondary | ICD-10-CM | POA: Insufficient documentation

## 2018-07-29 MED ORDER — ATORVASTATIN CALCIUM 20 MG PO TABS: 20.0000 mg | ORAL_TABLET | Freq: Every day | ORAL | 3 refills | Status: DC

## 2018-08-12 ENCOUNTER — Encounter: Payer: 59 | Admitting: Family Medicine

## 2018-08-22 ENCOUNTER — Encounter: Payer: Self-pay | Admitting: General Surgery

## 2018-08-25 ENCOUNTER — Encounter: Payer: Self-pay | Admitting: Family Medicine

## 2018-08-25 ENCOUNTER — Ambulatory Visit: Payer: 59 | Admitting: Family Medicine

## 2018-08-25 ENCOUNTER — Other Ambulatory Visit (HOSPITAL_COMMUNITY)
Admission: RE | Admit: 2018-08-25 | Discharge: 2018-08-25 | Disposition: A | Discharge status: Home / Self Care | Payer: 59 | Source: Ambulatory Visit | Attending: Family Medicine | Admitting: Family Medicine

## 2018-08-25 ENCOUNTER — Ambulatory Visit
Admission: RE | Admit: 2018-08-25 | Discharge: 2018-08-25 | Disposition: A | Discharge status: Home / Self Care | Payer: 59 | Source: Ambulatory Visit | Attending: Family Medicine | Admitting: Family Medicine

## 2018-08-25 VITALS — BP 128/76 | HR 96 | Temp 98.6°F | Resp 16 | Ht 68.5 in | Wt 257.7 lb

## 2018-08-25 DIAGNOSIS — F17200 Nicotine dependence, unspecified, uncomplicated: Secondary | ICD-10-CM | POA: Diagnosis not present

## 2018-08-25 DIAGNOSIS — E782 Mixed hyperlipidemia: Secondary | ICD-10-CM

## 2018-08-25 DIAGNOSIS — R110 Nausea: Secondary | ICD-10-CM | POA: Diagnosis not present

## 2018-08-25 DIAGNOSIS — Z113 Encounter for screening for infections with a predominantly sexual mode of transmission: Secondary | ICD-10-CM | POA: Insufficient documentation

## 2018-08-25 DIAGNOSIS — R079 Chest pain, unspecified: Secondary | ICD-10-CM

## 2018-08-25 DIAGNOSIS — R5383 Other fatigue: Secondary | ICD-10-CM | POA: Insufficient documentation

## 2018-08-25 DIAGNOSIS — I10 Essential (primary) hypertension: Secondary | ICD-10-CM | POA: Diagnosis not present

## 2018-08-25 MED ORDER — OMEPRAZOLE 20 MG PO CPDR: 20.0000 mg | DELAYED_RELEASE_CAPSULE | Freq: Every day | ORAL | 0 refills | Status: DC

## 2018-08-25 NOTE — Patient Instructions (Signed)
Please go to the outpatient imaging center for your chest Xray

## 2018-08-25 NOTE — Progress Notes (Signed)
Name: Joan Davis   MRN: [id number]    DOB: 1966-02-24   Date:08/25/2018       Progress Note  Subjective  Chief Complaint  Chief Complaint  Patient presents with  . Nausea    and headache off and on for 1 week  . Cyst    found on inside of vagina today    HPI  Pt presents for the following concerns:  Chest Pain, Fatigue, and Nausea - Fatigue for 1 week, nausea and nagging headache for 1-2 days.  No vomiting.  Endorses some chest pain for about a week - stays about the same regardless of activity or time of day; right side or sternum, and is not reproducible with palpation; denies left arm pain.  She did recently decrease to 4 cigarettes a day.  She is not taking statin medication as prescribed; is taking HCTZ as prescribed and BP is at goal today. She is still working 3 jobs as well which is physically demanding.  She has +family history of MI - father around age 42.  Cyst on vagina - noticed this morning when showering; area is not painful, no bleeding or drainage from the area. Denies fevers/chills. Would like examination; she does note new sexual partner in the last year.  We will do STI testing; recent HIV was negative.  Patient Active Problem List   Diagnosis Date Noted  . Mixed hyperlipidemia 07/29/2018  . Vertigo 02/06/2018  . COPD (chronic obstructive pulmonary disease) ([place]) 12/20/2017  . Hypertension 12/20/2017  . Food impaction of esophagus     Social History   Tobacco Use  . Smoking status: Current Every Day Smoker    Packs/day: 0.50    Types: Cigarettes  . Smokeless tobacco: Never Used  Substance Use Topics  . Alcohol use: No     Current Outpatient Medications:  .  hydrochlorothiazide (HYDRODIURIL) 25 MG tablet, Take 1 tablet (25 mg total) by mouth daily., Disp: 90 tablet, Rfl: 0 .  atorvastatin (LIPITOR) 20 MG tablet, Take 1 tablet (20 mg total) by mouth daily. (Patient not taking: Reported on 08/25/2018), Disp: 90 tablet, Rfl: 3 .   budesonide-formoterol (SYMBICORT) 160-4.5 MCG/ACT inhaler, Inhale 2 puffs into the lungs 2 (two) times daily. (Patient not taking: Reported on 08/25/2018), Disp: 1 Inhaler, Rfl: 2 .  fluticasone (FLONASE) 50 MCG/ACT nasal spray, Place 2 sprays into both nostrils daily. (Patient not taking: Reported on 08/25/2018), Disp: 16 g, Rfl: 2 .  Meclizine HCl 25 MG CHEW, Chew 0.5-1 tablets (12.5-25 mg total) by mouth 2 (two) times daily as needed (Dizziness). (Patient not taking: Reported on 08/25/2018), Disp: 20 each, Rfl: 0 .  meloxicam (MOBIC) 15 MG tablet, TAKE ONE TABLET BY MOUTH EVERY DAY FOR SEVEN DAYS (Patient not taking: Reported on 08/25/2018), Disp: 20 tablet, Rfl: 0 .  Nicotine 21-14-7 MG/24HR KIT, Place 1 patch onto the skin daily. (Patient not taking: Reported on 08/25/2018), Disp: 54 each, Rfl: 0  No Known Allergies  I personally reviewed active problem list, medication list, allergies, family history, lab results with the patient/caregiver today.  ROS  Ten systems reviewed and is negative except as mentioned in HPI  Objective  Vitals:   08/25/18 1120  BP: 128/76  Pulse: 96  Resp: 16  Temp: 98.6 F (37 C)  TempSrc: Oral  SpO2: 99%  Weight: 257 lb 11.2 oz (116.9 kg)  Height: 5' 8.5" (1.74 m)     Body mass index is 38.61 kg/m.  Nursing Note and  Vital Signs reviewed.  Physical Exam  Constitutional: Patient appears well-developed and well-nourished. No distress.  HENT: Head: Normocephalic and atraumatic. Nose: Nose normal. Mouth/Throat: Oropharynx is clear and moist. No oropharyngeal exudate.  Eyes: Conjunctivae and EOM are normal. Pupils are equal, round, and reactive to light. No scleral icterus.  Neck: Normal range of motion. Neck supple. No JVD present. No thyromegaly present.  Cardiovascular: Normal rate, regular rhythm and normal heart sounds.  No murmur heard. No BLE edema. Right sided, low substernal pain is not reproducible with palpation Pulmonary/Chest: Effort, normal  and breath sounds normal. No respiratory distress. Abdominal: Soft. Bowel sounds are normal, no distension. There is no tenderness. no masses Breast: no lumps or masses, no nipple discharge or rashes FEMALE GENITALIA:  External genitalia normal; External urethra normal; Vaginal vault slightly atrophied without discharge or lesions. When asked to ID area of concern, patient points to the urethra - reassurance that this is normal anatomy.  Cervix normal without discharge or lesions.  There is no vaginal tenderness. Musculoskeletal: Normal range of motion, no joint effusions. No gross deformities Neurological: he is alert and oriented to person, place, and time. No cranial nerve deficit. Coordination, balance, strength, speech and gait are normal.  Skin: Skin is warm and dry. No rash noted. No erythema.  Psychiatric: Patient has a normal mood and affect. behavior is normal. Judgment and thought content normal.  No results found for this or any previous visit (from the past 72 hour(s)).  Assessment & Plan  1. Fatigue, unspecified type - EKG 12-Lead - NSR, no concerns today - COMPLETE METABOLIC PANEL WITH GFR - CBC - DG Chest 2 View; Future - She is a smoker and requests CXR as she is concerned for cancer; discussed risk/benefit of CXR, that CT Chest lung cancer screening will have higher yield, but that we would start with this and other work up per orders for fatigue. - TSH  2. Essential hypertension - COMPLETE METABOLIC PANEL WITH GFR - Ambulatory referral to Cardiology  3. Mixed hyperlipidemia - Ambulatory referral to Cardiology - Discussed restarting atorvastatin as she has not been taking this.  She is agreeable.  4. Nausea - COMPLETE METABOLIC PANEL WITH GFR - H. pylori breath test  5. Chest pain, unspecified type - EKG 12-Lead - DG Chest 2 View; Future - Ambulatory referral to Cardiology - omeprazole (PRILOSEC) 20 MG capsule; Take 1 capsule (20 mg total) by mouth daily.   Dispense: 90 capsule; Refill: 0 - H. pylori breath test - With her family history and personal risk factors (HLD, HTN, Obesity, Smoker), we will send to Cardiology for additional work up.  In the mean time, we will treat for GERD to rule this out as a cause and performs labs as above.   6. Routine screening for STI (sexually transmitted infection) - Cervicovaginal ancillary only - RPR - Reassurance that area of concern is normal vagina/urethral anatomy.  -Red flags and when to present for emergency care or RTC including fever >101.25F, chest pain, shortness of breath, new/worsening/un-resolving symptoms, reviewed with patient at time of visit. Follow up and care instructions discussed and provided in AVS.

## 2018-08-26 ENCOUNTER — Other Ambulatory Visit (HOSPITAL_COMMUNITY): Payer: Self-pay | Admitting: General Surgery

## 2018-08-26 ENCOUNTER — Other Ambulatory Visit: Payer: Self-pay | Admitting: General Surgery

## 2018-08-26 LAB — COMPLETE METABOLIC PANEL WITH GFR
AG Ratio: 1.3 (calc) (ref 1.0–2.5)
ALBUMIN MSPROF: 3.9 g/dL (ref 3.6–5.1)
ALT: 16 U/L (ref 6–29)
AST: 16 U/L (ref 10–35)
Alkaline phosphatase (APISO): 72 U/L (ref 33–130)
BUN: 15 mg/dL (ref 7–25)
CALCIUM: 9.2 mg/dL (ref 8.6–10.4)
CO2: 30 mmol/L (ref 20–32)
CREATININE: 0.8 mg/dL (ref 0.50–1.05)
Chloride: 105 mmol/L (ref 98–110)
GFR, EST NON AFRICAN AMERICAN: 85 mL/min/{1.73_m2} (ref >=60)
GFR, Est African American: 99 mL/min/{1.73_m2} (ref >=60)
Globulin: 3 g/dL (calc) (ref 1.9–3.7)
Glucose, Bld: 85 mg/dL (ref 65–139)
Potassium: 3.9 mmol/L (ref 3.5–5.3)
SODIUM: 140 mmol/L (ref 135–146)
Total Bilirubin: 0.4 mg/dL (ref 0.2–1.2)
Total Protein: 6.9 g/dL (ref 6.1–8.1)

## 2018-08-26 LAB — CBC
HCT: 41.8 % (ref 35.0–45.0)
Hemoglobin: 14 g/dL (ref 11.7–15.5)
MCH: 30.5 pg (ref 27.0–33.0)
MCHC: 33.5 g/dL (ref 32.0–36.0)
MCV: 91.1 fL (ref 80.0–100.0)
MPV: 9.9 fL (ref 7.5–12.5)
Platelets: 353 10*3/uL (ref 140–400)
RBC: 4.59 10*6/uL (ref 3.80–5.10)
RDW: 12.8 % (ref 11.0–15.0)
WBC: 8.5 10*3/uL (ref 3.8–10.8)

## 2018-08-26 LAB — RPR: RPR Ser Ql: NONREACTIVE

## 2018-08-26 LAB — CERVICOVAGINAL ANCILLARY ONLY
Chlamydia: NEGATIVE
Neisseria Gonorrhea: NEGATIVE

## 2018-08-26 LAB — TSH: TSH: 0.56 m[IU]/L

## 2018-09-02 ENCOUNTER — Other Ambulatory Visit: Payer: Self-pay | Admitting: General Surgery

## 2018-09-04 ENCOUNTER — Ambulatory Visit: Payer: Self-pay | Admitting: Gastroenterology

## 2018-10-28 ENCOUNTER — Ambulatory Visit: Payer: 59 | Admitting: Family Medicine

## 2018-11-03 ENCOUNTER — Other Ambulatory Visit: Payer: Self-pay | Admitting: Orthopedic Surgery

## 2018-11-03 DIAGNOSIS — M25561 Pain in right knee: Secondary | ICD-10-CM

## 2018-11-18 ENCOUNTER — Ambulatory Visit
Admission: RE | Admit: 2018-11-18 | Discharge: 2018-11-18 | Disposition: A | Discharge status: Home / Self Care | Payer: 59 | Source: Ambulatory Visit | Attending: Orthopedic Surgery | Admitting: Orthopedic Surgery

## 2018-11-18 DIAGNOSIS — M1711 Unilateral primary osteoarthritis, right knee: Secondary | ICD-10-CM | POA: Diagnosis not present

## 2018-11-18 DIAGNOSIS — X58XXXA Exposure to other specified factors, initial encounter: Secondary | ICD-10-CM | POA: Diagnosis not present

## 2018-11-18 DIAGNOSIS — M25561 Pain in right knee: Secondary | ICD-10-CM | POA: Insufficient documentation

## 2018-11-18 DIAGNOSIS — S83281A Other tear of lateral meniscus, current injury, right knee, initial encounter: Secondary | ICD-10-CM | POA: Diagnosis not present

## 2018-11-28 ENCOUNTER — Other Ambulatory Visit: Payer: Self-pay | Admitting: Orthopedic Surgery

## 2018-12-05 ENCOUNTER — Other Ambulatory Visit: Payer: Self-pay

## 2018-12-05 ENCOUNTER — Encounter
Admission: RE | Admit: 2018-12-05 | Discharge: 2018-12-05 | Disposition: A | Discharge status: Home / Self Care | Payer: 59 | Source: Ambulatory Visit | Attending: Orthopedic Surgery | Admitting: Orthopedic Surgery

## 2018-12-05 HISTORY — DX: Gastro-esophageal reflux disease without esophagitis: K21.9

## 2018-12-05 NOTE — Patient Instructions (Signed)
Your procedure is scheduled on: 12-09-18 TUESDAY Report to Same Day Surgery 2nd floor medical mall [facility] Entrance-take elevator on left to 2nd floor.  Check in with surgery information desk.) To find out your arrival time please call [phone] between 1PM - 3PM on 12-08-18 MONDAY  Remember: Instructions that are not followed completely may result in serious medical risk, up to and including death, or upon the discretion of your surgeon and anesthesiologist your surgery may need to be rescheduled.    _x___ 1. Do not eat food after midnight the night before your procedure. NO GUM OR CANDY AFTER MIDNIGHT.  You may drink clear liquids up to 2 hours before you are scheduled to arrive at the hospital for your procedure.  Do not drink clear liquids within 2 hours of your scheduled arrival to the hospital.  Clear liquids include  --Water or Apple juice without pulp  --Clear carbohydrate beverage such as ClearFast or Gatorade  --Black Coffee or Clear Tea (No milk, no creamers, do not add anything to the coffee or Tea   ____Ensure clear carbohydrate drink on the way to the hospital for bariatric patients  ____Ensure clear carbohydrate drink 3 hours before surgery for Dr Dwyane Luo patients if physician instructed.     __x__ 2. No Alcohol for 24 hours before or after surgery.   __x__3. No Smoking or e-cigarettes for 24 prior to surgery.  Do not use any chewable tobacco products for at least 6 hour prior to surgery   ____  4. Bring all medications with you on the day of surgery if instructed.    __x__ 5. Notify your doctor if there is any change in your medical condition     (cold, fever, infections).    x___6. On the morning of surgery brush your teeth with toothpaste and water.  You may rinse your mouth with mouth wash if you wish.  Do not swallow any toothpaste or mouthwash.   Do not wear jewelry, make-up, hairpins, clips or nail polish.  Do not wear lotions, powders, or perfumes.  You may wear deodorant.  Do not shave 48 hours prior to surgery. Men may shave face and neck.  Do not bring valuables to the hospital.    [facility] is not responsible for any belongings or valuables.               Contacts, dentures or bridgework may not be worn into surgery.  Leave your suitcase in the car. After surgery it may be brought to your room.  For patients admitted to the hospital, discharge time is determined by your treatment team.  _  Patients discharged the day of surgery will not be allowed to drive home.  You will need someone to drive you home and stay with you the night of your procedure.    Please read over the following fact sheets that you were given:   [facility] Preparing for Surgery   _x___ TAKE THE FOLLOWING MEDICATION THE MORNING OF SURGERY WITH A SMALL SIP OF WATER. These include:  1. PRILOSEC (OMEPRAZOLE)  2. TAKE AN EXTRA PRILOSEC THE NIGHT BEFORE YOUR SURGERY  3.  4.  5.  6.  ____Fleets enema or Magnesium Citrate as directed.   _x___ Use CHG Soap or sage wipes as directed on instruction sheet   ____ Use inhalers on the day of surgery and bring to hospital day of surgery  ____ Stop Metformin and Janumet 2 days prior to surgery.  ____ Take 1/2 of usual insulin dose the night before surgery and none on the morning surgery.   ____ Follow recommendations from Cardiologist, Pulmonologist or PCP regarding  stopping Aspirin, Coumadin, Plavix ,Eliquis, Effient, or Pradaxa, and Pletal.  X____Stop Anti-inflammatories such as Advil, Aleve, Ibuprofen, Motrin, Naproxen, Naprosyn, Goodies powders or aspirin products NOW-OK to take Tylenol    ____ Stop supplements until after surgery.    ____ Bring C-Pap to the hospital.

## 2018-12-08 ENCOUNTER — Encounter
Admission: RE | Admit: 2018-12-08 | Discharge: 2018-12-08 | Disposition: A | Discharge status: Home / Self Care | Payer: 59 | Source: Ambulatory Visit | Attending: Orthopedic Surgery | Admitting: Orthopedic Surgery

## 2018-12-08 ENCOUNTER — Encounter: Payer: Self-pay | Admitting: *Deleted

## 2018-12-08 DIAGNOSIS — S83271A Complex tear of lateral meniscus, current injury, right knee, initial encounter: Secondary | ICD-10-CM | POA: Diagnosis not present

## 2018-12-08 DIAGNOSIS — Z79899 Other long term (current) drug therapy: Secondary | ICD-10-CM | POA: Diagnosis not present

## 2018-12-08 DIAGNOSIS — Z825 Family history of asthma and other chronic lower respiratory diseases: Secondary | ICD-10-CM | POA: Diagnosis not present

## 2018-12-08 DIAGNOSIS — Z01812 Encounter for preprocedural laboratory examination: Secondary | ICD-10-CM

## 2018-12-08 DIAGNOSIS — Z823 Family history of stroke: Secondary | ICD-10-CM | POA: Diagnosis not present

## 2018-12-08 DIAGNOSIS — M1711 Unilateral primary osteoarthritis, right knee: Secondary | ICD-10-CM | POA: Diagnosis present

## 2018-12-08 DIAGNOSIS — F17210 Nicotine dependence, cigarettes, uncomplicated: Secondary | ICD-10-CM | POA: Diagnosis not present

## 2018-12-08 DIAGNOSIS — J449 Chronic obstructive pulmonary disease, unspecified: Secondary | ICD-10-CM | POA: Diagnosis not present

## 2018-12-08 DIAGNOSIS — Z9049 Acquired absence of other specified parts of digestive tract: Secondary | ICD-10-CM | POA: Diagnosis not present

## 2018-12-08 DIAGNOSIS — R42 Dizziness and giddiness: Secondary | ICD-10-CM | POA: Diagnosis not present

## 2018-12-08 DIAGNOSIS — Z90710 Acquired absence of both cervix and uterus: Secondary | ICD-10-CM | POA: Diagnosis not present

## 2018-12-08 DIAGNOSIS — X58XXXA Exposure to other specified factors, initial encounter: Secondary | ICD-10-CM | POA: Diagnosis not present

## 2018-12-08 DIAGNOSIS — Z791 Long term (current) use of non-steroidal anti-inflammatories (NSAID): Secondary | ICD-10-CM | POA: Diagnosis not present

## 2018-12-08 DIAGNOSIS — I10 Essential (primary) hypertension: Secondary | ICD-10-CM | POA: Diagnosis not present

## 2018-12-08 DIAGNOSIS — Z8249 Family history of ischemic heart disease and other diseases of the circulatory system: Secondary | ICD-10-CM | POA: Diagnosis not present

## 2018-12-08 DIAGNOSIS — M2341 Loose body in knee, right knee: Secondary | ICD-10-CM | POA: Diagnosis not present

## 2018-12-08 DIAGNOSIS — Z811 Family history of alcohol abuse and dependence: Secondary | ICD-10-CM | POA: Diagnosis not present

## 2018-12-08 DIAGNOSIS — K219 Gastro-esophageal reflux disease without esophagitis: Secondary | ICD-10-CM | POA: Diagnosis not present

## 2018-12-08 LAB — APTT: aPTT: 35 seconds (ref 24–36)

## 2018-12-08 LAB — CBC WITH DIFFERENTIAL/PLATELET
ABS IMMATURE GRANULOCYTES: 0.01 10*3/uL (ref 0.00–0.07)
Basophils Absolute: 0 10*3/uL (ref 0.0–0.1)
Basophils Relative: 1 %
Eosinophils Absolute: 0.1 10*3/uL (ref 0.0–0.5)
Eosinophils Relative: 1 %
HCT: 43.6 % (ref 36.0–46.0)
Hemoglobin: 13.9 g/dL (ref 12.0–15.0)
Immature Granulocytes: 0 %
Lymphocytes Relative: 34 %
Lymphs Abs: 2.8 10*3/uL (ref 0.7–4.0)
MCH: 30.5 pg (ref 26.0–34.0)
MCHC: 31.9 g/dL (ref 30.0–36.0)
MCV: 95.8 fL (ref 80.0–100.0)
MONO ABS: 0.4 10*3/uL (ref 0.1–1.0)
Monocytes Relative: 5 %
Neutro Abs: 4.7 10*3/uL (ref 1.7–7.7)
Neutrophils Relative %: 59 %
Platelets: 334 10*3/uL (ref 150–400)
RBC: 4.55 MIL/uL (ref 3.87–5.11)
RDW: 13.9 % (ref 11.5–15.5)
WBC: 8 10*3/uL (ref 4.0–10.5)
nRBC: 0 % (ref 0.0–0.2)

## 2018-12-08 LAB — PROTIME-INR
INR: 0.94
Prothrombin Time: 12.5 seconds (ref 11.4–15.2)

## 2018-12-08 LAB — BASIC METABOLIC PANEL
Anion gap: 8 (ref 5–15)
BUN: 13 mg/dL (ref 6–20)
CO2: 26 mmol/L (ref 22–32)
Calcium: 9.3 mg/dL (ref 8.9–10.3)
Chloride: 106 mmol/L (ref 98–111)
Creatinine, Ser: 0.71 mg/dL (ref 0.44–1.00)
GFR calc non Af Amer: >60 mL/min (ref >60)
Glucose, Bld: 90 mg/dL (ref 70–99)
Potassium: 3.5 mmol/L (ref 3.5–5.1)
Sodium: 140 mmol/L (ref 135–145)

## 2018-12-08 MED ORDER — CEFAZOLIN SODIUM-DEXTROSE 2-4 GM/100ML-% IV SOLN
2.0000 g | INTRAVENOUS | Status: AC
  Administered 2018-12-09: 2 g via INTRAVENOUS

## 2018-12-09 ENCOUNTER — Ambulatory Visit: Payer: 59 | Admitting: Anesthesiology

## 2018-12-09 ENCOUNTER — Encounter
Admission: RE | Disposition: A | Discharge status: Home / Self Care | Payer: Self-pay | Source: Home / Self Care | Attending: Orthopedic Surgery

## 2018-12-09 ENCOUNTER — Encounter: Payer: Self-pay | Admitting: *Deleted

## 2018-12-09 ENCOUNTER — Ambulatory Visit
Admission: RE | Admit: 2018-12-09 | Discharge: 2018-12-09 | Disposition: A | Discharge status: Home / Self Care | Payer: 59 | Attending: Orthopedic Surgery | Admitting: Orthopedic Surgery

## 2018-12-09 ENCOUNTER — Other Ambulatory Visit: Payer: Self-pay

## 2018-12-09 DIAGNOSIS — Z825 Family history of asthma and other chronic lower respiratory diseases: Secondary | ICD-10-CM | POA: Insufficient documentation

## 2018-12-09 DIAGNOSIS — Z9049 Acquired absence of other specified parts of digestive tract: Secondary | ICD-10-CM | POA: Insufficient documentation

## 2018-12-09 DIAGNOSIS — Z90710 Acquired absence of both cervix and uterus: Secondary | ICD-10-CM | POA: Insufficient documentation

## 2018-12-09 DIAGNOSIS — Z8249 Family history of ischemic heart disease and other diseases of the circulatory system: Secondary | ICD-10-CM | POA: Insufficient documentation

## 2018-12-09 DIAGNOSIS — Z811 Family history of alcohol abuse and dependence: Secondary | ICD-10-CM | POA: Insufficient documentation

## 2018-12-09 DIAGNOSIS — Z79899 Other long term (current) drug therapy: Secondary | ICD-10-CM | POA: Insufficient documentation

## 2018-12-09 DIAGNOSIS — M1711 Unilateral primary osteoarthritis, right knee: Secondary | ICD-10-CM | POA: Insufficient documentation

## 2018-12-09 DIAGNOSIS — Z823 Family history of stroke: Secondary | ICD-10-CM | POA: Insufficient documentation

## 2018-12-09 DIAGNOSIS — K219 Gastro-esophageal reflux disease without esophagitis: Secondary | ICD-10-CM | POA: Insufficient documentation

## 2018-12-09 DIAGNOSIS — S83271A Complex tear of lateral meniscus, current injury, right knee, initial encounter: Secondary | ICD-10-CM | POA: Diagnosis not present

## 2018-12-09 DIAGNOSIS — X58XXXA Exposure to other specified factors, initial encounter: Secondary | ICD-10-CM | POA: Insufficient documentation

## 2018-12-09 DIAGNOSIS — F17210 Nicotine dependence, cigarettes, uncomplicated: Secondary | ICD-10-CM | POA: Insufficient documentation

## 2018-12-09 DIAGNOSIS — M2341 Loose body in knee, right knee: Secondary | ICD-10-CM | POA: Insufficient documentation

## 2018-12-09 DIAGNOSIS — I10 Essential (primary) hypertension: Secondary | ICD-10-CM | POA: Insufficient documentation

## 2018-12-09 DIAGNOSIS — Z791 Long term (current) use of non-steroidal anti-inflammatories (NSAID): Secondary | ICD-10-CM | POA: Insufficient documentation

## 2018-12-09 DIAGNOSIS — J449 Chronic obstructive pulmonary disease, unspecified: Secondary | ICD-10-CM | POA: Insufficient documentation

## 2018-12-09 DIAGNOSIS — R42 Dizziness and giddiness: Secondary | ICD-10-CM | POA: Insufficient documentation

## 2018-12-09 HISTORY — PX: KNEE ARTHROSCOPY: SHX127

## 2018-12-09 HISTORY — PX: CHONDROPLASTY: SHX5177

## 2018-12-09 HISTORY — DX: Dizziness and giddiness: R42

## 2018-12-09 SURGERY — ARTHROSCOPY, KNEE
Anesthesia: General | Laterality: Right

## 2018-12-09 MED ORDER — LIDOCAINE HCL (CARDIAC) PF 100 MG/5ML IV SOSY
PREFILLED_SYRINGE | INTRAVENOUS | Status: DC | PRN
  Administered 2018-12-09: 60 mg via INTRAVENOUS
  Administered 2018-12-09: 100 mg via INTRAVENOUS

## 2018-12-09 MED ORDER — LIDOCAINE HCL 1 % IJ SOLN
INTRAMUSCULAR | Status: DC | PRN
  Administered 2018-12-09: 5 mL

## 2018-12-09 MED ORDER — GLYCOPYRROLATE 0.2 MG/ML IJ SOLN
INTRAMUSCULAR | Status: DC | PRN
  Administered 2018-12-09: 0.2 mg via INTRAVENOUS

## 2018-12-09 MED ORDER — ASPIRIN EC 325 MG PO TBEC: 325.0000 mg | DELAYED_RELEASE_TABLET | Freq: Every day | ORAL | 0 refills | Status: DC

## 2018-12-09 MED ORDER — ONDANSETRON HCL 4 MG PO TABS: 4.0000 mg | ORAL_TABLET | Freq: Three times a day (TID) | ORAL | 0 refills | Status: DC | PRN

## 2018-12-09 MED ORDER — BUPIVACAINE-EPINEPHRINE 0.25% -1:200000 IJ SOLN
INTRAMUSCULAR | Status: DC | PRN
  Administered 2018-12-09: 20 mL

## 2018-12-09 MED ORDER — HYDROCODONE-ACETAMINOPHEN 5-325 MG PO TABS: 1.0000 | ORAL_TABLET | ORAL | 0 refills | Status: DC | PRN

## 2018-12-09 MED ORDER — ONDANSETRON HCL 4 MG/2ML IJ SOLN
INTRAMUSCULAR | Status: DC | PRN
  Administered 2018-12-09: 4 mg via INTRAVENOUS

## 2018-12-09 MED ORDER — ONDANSETRON HCL 4 MG/2ML IJ SOLN
4.0000 mg | Freq: Once | INTRAMUSCULAR | Status: AC | PRN
  Administered 2018-12-09: 4 mg via INTRAVENOUS

## 2018-12-09 MED ORDER — CHLORHEXIDINE GLUCONATE CLOTH 2 % EX PADS: 6.0000 | MEDICATED_PAD | Freq: Once | CUTANEOUS | Status: DC

## 2018-12-09 MED ORDER — MIDAZOLAM HCL 2 MG/2ML IJ SOLN
INTRAMUSCULAR | Status: DC | PRN
  Administered 2018-12-09: 2 mg via INTRAVENOUS

## 2018-12-09 MED ORDER — FENTANYL CITRATE (PF) 100 MCG/2ML IJ SOLN
INTRAMUSCULAR | Status: AC
  Administered 2018-12-09: 25 ug via INTRAVENOUS
  Filled 2018-12-09: qty 2

## 2018-12-09 MED ORDER — LACTATED RINGERS IV SOLN
INTRAVENOUS | Status: DC
  Administered 2018-12-09: 08:00:00 via INTRAVENOUS

## 2018-12-09 MED ORDER — CEFAZOLIN SODIUM-DEXTROSE 2-4 GM/100ML-% IV SOLN
INTRAVENOUS | Status: AC
  Filled 2018-12-09: qty 100

## 2018-12-09 MED ORDER — SODIUM CHLORIDE FLUSH 0.9 % IV SOLN
INTRAVENOUS | Status: AC
  Filled 2018-12-09: qty 10

## 2018-12-09 MED ORDER — ACETAMINOPHEN 10 MG/ML IV SOLN
INTRAVENOUS | Status: DC | PRN
  Administered 2018-12-09: 1000 mg via INTRAVENOUS

## 2018-12-09 MED ORDER — FENTANYL CITRATE (PF) 100 MCG/2ML IJ SOLN
25.0000 ug | INTRAMUSCULAR | Status: AC | PRN
  Administered 2018-12-09 (×6): 25 ug via INTRAVENOUS

## 2018-12-09 MED ORDER — FENTANYL CITRATE (PF) 100 MCG/2ML IJ SOLN
INTRAMUSCULAR | Status: DC | PRN
  Administered 2018-12-09 (×2): 50 ug via INTRAVENOUS
  Administered 2018-12-09: 25 ug via INTRAVENOUS
  Administered 2018-12-09: 50 ug via INTRAVENOUS
  Administered 2018-12-09: 25 ug via INTRAVENOUS

## 2018-12-09 MED ORDER — DEXAMETHASONE SODIUM PHOSPHATE 10 MG/ML IJ SOLN
INTRAMUSCULAR | Status: DC | PRN
  Administered 2018-12-09: 10 mg via INTRAVENOUS

## 2018-12-09 MED ORDER — PROPOFOL 10 MG/ML IV BOLUS
INTRAVENOUS | Status: DC | PRN
  Administered 2018-12-09: 170 mg via INTRAVENOUS

## 2018-12-09 MED ORDER — ONDANSETRON HCL 4 MG/2ML IJ SOLN
INTRAMUSCULAR | Status: AC
  Administered 2018-12-09: 4 mg via INTRAVENOUS
  Filled 2018-12-09: qty 2

## 2018-12-09 SURGICAL SUPPLY — 44 items
ADAPTER IRRIG TUBE 2 SPIKE SOL (ADAPTER) ×6 IMPLANT
BUR RADIUS 3.5 (BURR) ×3 IMPLANT
BUR RADIUS 4.0X18.5 (BURR) ×3 IMPLANT
BUR RADIUS 5.5 (BURR) ×2 IMPLANT
CANISTER SUCT LVC 12 LTR MEDI- (MISCELLANEOUS) ×3 IMPLANT
CLOSURE WOUND 1/2 X4 (GAUZE/BANDAGES/DRESSINGS) ×1
COOLER POLAR GLACIER W/PUMP (MISCELLANEOUS) IMPLANT
COVER WAND RF STERILE (DRAPES) ×1 IMPLANT
CUFF TOURN 24 STER (MISCELLANEOUS) IMPLANT
CUFF TOURN 30 STER DUAL PORT (MISCELLANEOUS) IMPLANT
CUFF TOURN SGL QUICK 42 (TOURNIQUET CUFF) ×2 IMPLANT
DEVICE SUCT BLK HOLE OR FLOOR (MISCELLANEOUS) ×1 IMPLANT
DRAPE IMP U-DRAPE 54X76 (DRAPES) ×3 IMPLANT
DURAPREP 26ML APPLICATOR (WOUND CARE) ×9 IMPLANT
GAUZE PETRO XEROFOAM 1X8 (MISCELLANEOUS) ×3 IMPLANT
GAUZE SPONGE 4X4 12PLY STRL (GAUZE/BANDAGES/DRESSINGS) ×3 IMPLANT
GLOVE BIOGEL PI IND STRL 9 (GLOVE) ×1 IMPLANT
GLOVE BIOGEL PI INDICATOR 9 (GLOVE) ×2
GLOVE SURG 9.0 ORTHO LTXF (GLOVE) ×6 IMPLANT
GOWN STRL REUS TWL 2XL XL LVL4 (GOWN DISPOSABLE) ×3 IMPLANT
GOWN STRL REUS W/ TWL LRG LVL3 (GOWN DISPOSABLE) ×1 IMPLANT
GOWN STRL REUS W/TWL LRG LVL3 (GOWN DISPOSABLE) ×2
IV LACTATED RINGER IRRG 3000ML (IV SOLUTION) ×8
IV LR IRRIG 3000ML ARTHROMATIC (IV SOLUTION) ×4 IMPLANT
KIT TURNOVER KIT A (KITS) ×3 IMPLANT
MANIFOLD NEPTUNE II (INSTRUMENTS) ×3 IMPLANT
MAT ABSORB  FLUID 56X50 GRAY (MISCELLANEOUS) ×2
MAT ABSORB FLUID 56X50 GRAY (MISCELLANEOUS) ×2 IMPLANT
NEEDLE HYPO 22GX1.5 SAFETY (NEEDLE) ×3 IMPLANT
PACK ARTHROSCOPY KNEE (MISCELLANEOUS) ×3 IMPLANT
PAD ABD DERMACEA PRESS 5X9 (GAUZE/BANDAGES/DRESSINGS) ×6 IMPLANT
PAD WRAPON POLAR KNEE (MISCELLANEOUS) ×1 IMPLANT
SET TUBE SUCT SHAVER OUTFL 24K (TUBING) ×3 IMPLANT
SET TUBE TIP INTRA-ARTICULAR (MISCELLANEOUS) ×3 IMPLANT
SOL PREP PVP 2OZ (MISCELLANEOUS)
SOLUTION PREP PVP 2OZ (MISCELLANEOUS) IMPLANT
STRIP CLOSURE SKIN 1/2X4 (GAUZE/BANDAGES/DRESSINGS) ×2 IMPLANT
SUT ETHILON 4-0 (SUTURE) ×2
SUT ETHILON 4-0 FS2 18XMFL BLK (SUTURE) ×1
SUTURE ETHLN 4-0 FS2 18XMF BLK (SUTURE) ×1 IMPLANT
TUBING ARTHRO INFLOW-ONLY STRL (TUBING) ×3 IMPLANT
WAND HAND CNTRL MULTIVAC 50 (MISCELLANEOUS) ×2 IMPLANT
WAND WEREWOLF FLOW 90D (MISCELLANEOUS) ×3 IMPLANT
WRAPON POLAR PAD KNEE (MISCELLANEOUS) ×3

## 2018-12-09 NOTE — Transfer of Care (Signed)
Immediate Anesthesia Transfer of Care Note  Patient: Joan Davis  Procedure(s) Performed: ARTHROSCOPY KNEE WITH PARTIAL LATERAL MENISCECTOMY (Right ) CHONDROPLASTY LATERAL FEMORAL CONDYLE (Right )  Patient Location: PACU  Anesthesia Type:General  Level of Consciousness: sedated  Airway & Oxygen Therapy: Patient Spontanous Breathing and Patient connected to face mask oxygen  Post-op Assessment: Report given to RN and Post -op Vital signs reviewed and stable  Post vital signs: Reviewed and stable  Last Vitals:  Vitals Value Taken Time  BP 156/84 12/09/2018 10:54 AM  Temp 36.7 C 12/09/2018 10:54 AM  Pulse 73 12/09/2018 10:55 AM  Resp 17 12/09/2018 10:55 AM  SpO2 100 % 12/09/2018 10:55 AM  Vitals shown include unvalidated device data.  Last Pain:  Vitals:   12/09/18 0707  TempSrc: Oral  PainSc: 0-No pain         Complications: No apparent anesthesia complications

## 2018-12-09 NOTE — Anesthesia Procedure Notes (Signed)
Procedure Name: LMA Insertion Date/Time: 12/09/2018 9:07 AM Performed by: Nelda Marseille, CRNA Pre-anesthesia Checklist: Patient identified, Patient being monitored, Timeout performed, Emergency Drugs available and Suction available Patient Re-evaluated:Patient Re-evaluated prior to induction Oxygen Delivery Method: Circle system utilized Preoxygenation: Pre-oxygenation with 100% oxygen Induction Type: IV induction Ventilation: Mask ventilation without difficulty LMA: LMA inserted LMA Size: 3.5 Tube type: Oral Number of attempts: 1 Placement Confirmation: positive ETCO2 and breath sounds checked- equal and bilateral Tube secured with: Tape Dental Injury: Teeth and Oropharynx as per pre-operative assessment

## 2018-12-09 NOTE — Anesthesia Post-op Follow-up Note (Signed)
Anesthesia QCDR form completed.        

## 2018-12-09 NOTE — Anesthesia Preprocedure Evaluation (Signed)
Anesthesia Evaluation  Patient identified by MRN, date of birth, ID band Patient awake    Reviewed: Allergy & Precautions, NPO status , Patient's Chart, lab work & pertinent test results  History of Anesthesia Complications Negative for: history of anesthetic complications  Airway Mallampati: III       Dental   Pulmonary neg sleep apnea, COPD,  COPD inhaler, Current Smoker,           Cardiovascular hypertension, Pt. on medications (-) Past MI and (-) CHF (-) dysrhythmias (-) Valvular Problems/Murmurs     Neuro/Psych neg Seizures    GI/Hepatic Neg liver ROS, GERD  Medicated and Controlled,  Endo/Other  neg diabetes  Renal/GU negative Renal ROS     Musculoskeletal   Abdominal   Peds  Hematology   Anesthesia Other Findings   Reproductive/Obstetrics                             Anesthesia Physical Anesthesia Plan  ASA: III  Anesthesia Plan: General   Post-op Pain Management:    Induction: Intravenous  PONV Risk Score and Plan: 2 and Dexamethasone and Ondansetron  Airway Management Planned: LMA  Additional Equipment:   Intra-op Plan:   Post-operative Plan:   Informed Consent: I have reviewed the patients History and Physical, chart, labs and discussed the procedure including the risks, benefits and alternatives for the proposed anesthesia with the patient or authorized representative who has indicated his/her understanding and acceptance.     Plan Discussed with:   Anesthesia Plan Comments:         Anesthesia Quick Evaluation

## 2018-12-09 NOTE — Op Note (Addendum)
  PATIENT:  Joan Davis  PRE-OPERATIVE DIAGNOSIS:  TEAR OF LATERAL MENISCUS WITH OSTEOARTHRITIS, RIGHT KNEE  POST-OPERATIVE DIAGNOSIS:  Same  PROCEDURE:  RIGHT KNEE ARTHROSCOPY WITH  Partial LATERAL MENISECTOMY, removal of chondral loose body, synovectomy, abrasion arthroplasty of the lateral femoral condyle  SURGEON:  Thornton Park, MD  ANESTHESIA:   General  PREOPERATIVE INDICATIONS:  Joan Davis  53 y.o. female with a diagnosis of [place] who failed conservative managementand elected for surgical management.    The risks benefits and alternatives were discussed with the patient preoperatively including the risks of infection, bleeding, nerve injury, knee stiffness, persistent pain, osteoarthritis and the need for further surgery. Medical  risks include DVT and pulmonary embolism, myocardial infarction, stroke, pneumonia, respiratory failure and death. The patient understood these risks and wished to proceed.  OPERATIVE FINDINGS: Complex radial tear of the posterior horn of the lateral meniscus with osteoarthritis of the lateral compartment including a two full thickness chondral lesions of the lateral femoral condyle  OPERATIVE PROCEDURE: Patient was met in the preoperative area. The operative extremity was signed with the word yes and my initials according the hospital's correct site of surgery protocol.  A pre-op H&P was performed.  The patient was brought to the operating room where they was placed supine on the operative table. General anesthesia was administered. The patient was prepped and draped in a sterile fashion.  A timeout was performed to verify the patient's name, date of birth, medical record number, correct site of surgery correct procedure to be performed. It was also used to verify the patient received antibiotics that all appropriate instruments, and radiographic studies were available in the room. Once all in attendance were in agreement,  the case began.  Proposed arthroscopy incisions were drawn out with a surgical marker. These were pre-injected with 1% lidocaine plain. An 11 blade was used to establish an inferior lateral and inferomedial portals. The inferomedial portal was created using a 18-gauge spinal needle under direct visualization.  A full diagnostic examination of the knee was performed including the suprapatellar pouch, patellofemoral joint, medial lateral compartments as well as the medial lateral gutters, the intercondylar notch in the posterior knee.  A partial synovectomy was also performed using a 4-0 resector shaver blade and 90 ArthroCare wand.  Patient had the lateral meniscal tear treated with a 4-0 resector shaver blade and straight duckbill basket. The meniscus was debrided until a stable rim was achieved.   A full thickness segment of the posterior horn of the lateral meniscus was debrided.  An abrasion arthroplasty of the two grade IV chondral lesions lateral femoral condyle was performed with a 4.0 mm resector shaver blade.  A chondral loose body was seen and removed from the medial gutter with an arthroscopic grasper.  The knee was then copiously lavaged. All arthroscopic instruments were removed. The 2 arthroscopy portals were closed with 4-0 nylon. Steri-Strips were applied along with a dry sterile and compressive dressing. The patient was brought to the PACU in stable condition. I was scrubbed and present for the entire case and all sharp and instrument counts were correct at the conclusion the case. I spoke with the patient's family postoperatively to let them know the case was performed without complication and the patient was stable in the recovery room.   Timoteo Gaul, MD

## 2018-12-09 NOTE — Anesthesia Postprocedure Evaluation (Signed)
Anesthesia Post Note  Patient: Joan Davis  Procedure(s) Performed: ARTHROSCOPY KNEE WITH PARTIAL LATERAL MENISCECTOMY (Right ) CHONDROPLASTY LATERAL FEMORAL CONDYLE (Right )  Patient location during evaluation: PACU Anesthesia Type: General Level of consciousness: awake and alert Pain management: pain level controlled Vital Signs Assessment: post-procedure vital signs reviewed and stable Respiratory status: spontaneous breathing and respiratory function stable Cardiovascular status: stable Anesthetic complications: no     Last Vitals:  Vitals:   12/09/18 1214 12/09/18 1311  BP: (!) 118/97 118/68  Pulse: (!) 58 (!) 57  Resp: 14 18  Temp: 36.8 C 36.7 C  SpO2: 97% 99%    Last Pain:  Vitals:   12/09/18 1214  TempSrc: Oral  PainSc: Asleep                 Sharone Picchi K

## 2018-12-09 NOTE — H&P (Signed)
PREOPERATIVE H&P  Chief Complaint: TEAR OF LATERAL MENISCUS OF RIGHT KNEE  HPI: Joan Davis is a 53 y.o. female who presents for preoperative history and physical with a diagnosis of TEAR OF LATERAL MENISCUS OF RIGHT KNEE. Symptoms are rated as moderate to severe, and have been worsening.  This is significantly impairing activities of daily living.  She has elected for surgical management.   Past Medical History:  Diagnosis Date  . COPD (chronic obstructive pulmonary disease) ([place])   . GERD (gastroesophageal reflux disease)   . Hypertension   . Vertigo    Past Surgical History:  Procedure Laterality Date  . CHOLECYSTECTOMY    . FOREIGN BODY REMOVAL N/A 07/15/2016   Procedure: FOREIGN BODY REMOVAL;  Surgeon: Lucilla Lame, MD;  Location: [facility] ENDOSCOPY;  Service: Endoscopy;  Laterality: N/A;  . TOTAL ABDOMINAL HYSTERECTOMY     Social History   Socioeconomic History  . Marital status: Single    Spouse name: Not on file  . Number of children: Not on file  . Years of education: Not on file  . Highest education level: Not on file  Occupational History  . Not on file  Social Needs  . Financial resource strain: Not on file  . Food insecurity:    Worry: Not on file    Inability: Not on file  . Transportation needs:    Medical: Not on file    Non-medical: Not on file  Tobacco Use  . Smoking status: Current Every Day Smoker    Packs/day: 0.50    Years: 31.00    Pack years: 15.50    Types: Cigarettes  . Smokeless tobacco: Never Used  Substance and Sexual Activity  . Alcohol use: No  . Drug use: No  . Sexual activity: Yes  Lifestyle  . Physical activity:    Days per week: Not on file    Minutes per session: Not on file  . Stress: Not on file  Relationships  . Social connections:    Talks on phone: Not on file    Gets together: Not on file    Attends religious service: Not on file    Active member of club or organization: Not on file    Attends meetings of clubs or  organizations: Not on file    Relationship status: Not on file  Other Topics Concern  . Not on file  Social History Narrative  . Not on file   Family History  Problem Relation Age of Onset  . Alcohol abuse Mother   . Heart disease Father   . Hypertension Father   . Hypertension Brother   . Hyperlipidemia Brother   . Asthma Maternal Grandmother   . Stroke Maternal Grandfather   . Hypertension Maternal Grandfather   . Congestive Heart Failure Paternal Grandmother   . Heart disease Paternal Grandfather   . Congestive Heart Failure Paternal Grandfather    No Known Allergies Prior to Admission medications   Medication Sig Start Date End Date Taking? Authorizing Provider  hydrochlorothiazide (HYDRODIURIL) 25 MG tablet Take 1 tablet (25 mg total) by mouth daily. 07/25/18  Yes Hubbard Hartshorn, FNP  meclizine (ANTIVERT) 25 MG tablet Take 25 mg by mouth 3 (three) times daily as needed for dizziness.   Yes [provider]  omeprazole (PRILOSEC) 20 MG capsule Take 1 capsule (20 mg total) by mouth daily. Patient taking differently: Take 20 mg by mouth every morning.  08/25/18  Yes Hubbard Hartshorn, FNP  atorvastatin (LIPITOR)  20 MG tablet Take 1 tablet (20 mg total) by mouth daily. Patient not taking: Reported on 11/28/2018 07/29/18   Hubbard Hartshorn, FNP  budesonide-formoterol [facility]) 160-4.5 MCG/ACT inhaler Inhale 2 puffs into the lungs 2 (two) times daily. Patient not taking: Reported on 12/09/2018 07/25/18   Hubbard Hartshorn, FNP  meloxicam ([place]) 15 MG tablet TAKE ONE TABLET BY MOUTH EVERY DAY FOR SEVEN DAYS Patient not taking: Reported on 11/28/2018 07/25/18   Hubbard Hartshorn, FNP     Positive ROS: All other systems have been reviewed and were otherwise negative with the exception of those mentioned in the HPI and as above.  Physical Exam: General: Alert, no acute distress Cardiovascular: Regular rate and rhythm, no murmurs rubs or gallops.  No pedal edema Respiratory: Clear to  auscultation bilaterally, no wheezes rales or rhonchi. No cyanosis, no use of accessory musculature GI: No organomegaly, abdomen is soft and non-tender nondistended with positive bowel sounds. Skin: Skin intact, no lesions within the operative field. Neurologic: Sensation intact distally Psychiatric: Patient is competent for consent with normal mood and affect Lymphatic: No cervical lymphadenopathy  MUSCULOSKELETAL: Right knee:  + McMurrays, tenderness at lateral joint line, ROM 0-120, no erythema, ecchymosis or effusion.  Skin is intact.  No calf tenderness or lower leg edema.  Neurovascular intact.  No ligamentous laxity.  Assessment: TEAR OF LATERAL MENISCUS OF RIGHT KNEE  Plan: Plan for Procedure(s): ARTHROSCOPY LEFT KNEE WITH PARTIAL LATERAL MENISCECTOMY  I have reviewed the details of the operation as well as the postoperative course with the patient and her family.  I discussed the risks and benefits of surgery. The risks include but are not limited to infection, bleeding, nerve or blood vessel injury, joint stiffness or loss of motion, persistent pain, weakness or instability, and the need for further surgery. Medical risks include but are not limited to DVT and pulmonary embolism, myocardial infarction, stroke, pneumonia, respiratory failure and death. Patient understood these risks and wished to proceed.    Thornton Park, MD   12/09/2018 8:42 AM

## 2018-12-10 ENCOUNTER — Encounter: Payer: Self-pay | Admitting: Orthopedic Surgery

## 2018-12-10 NOTE — Progress Notes (Signed)
Pt stated she felt her bandages were wet so she removed them due to being saturated with blood and placed gauze on the surgical area. I advised the pt to contact the doctor's office as soon as the office opened to let them know since the bandages were suppose to stay on for 3 days.

## 2018-12-30 ENCOUNTER — Telehealth: Payer: Self-pay

## 2018-12-30 NOTE — Telephone Encounter (Signed)
Having dizzy spells again, Can she get refill on Antivert

## 2018-12-31 MED ORDER — MECLIZINE HCL 25 MG PO TABS: 25.0000 mg | ORAL_TABLET | Freq: Three times a day (TID) | ORAL | 0 refills | Status: DC | PRN

## 2018-12-31 NOTE — Telephone Encounter (Signed)
Called pt to try to get an appt, pt states she just went back to work and will have to call us when shes available to make an appt

## 2018-12-31 NOTE — Telephone Encounter (Signed)
I provided 10 tablets. She needs to come in for an appointment for additional refills.

## 2019-01-14 ENCOUNTER — Encounter: Payer: Self-pay | Admitting: Family Medicine

## 2019-01-14 ENCOUNTER — Ambulatory Visit (INDEPENDENT_AMBULATORY_CARE_PROVIDER_SITE_OTHER): Payer: 59 | Admitting: Family Medicine

## 2019-01-14 VITALS — BP 140/86 | HR 86 | Temp 98.2°F | Resp 18 | Ht 69.0 in | Wt 265.5 lb

## 2019-01-14 DIAGNOSIS — K219 Gastro-esophageal reflux disease without esophagitis: Secondary | ICD-10-CM | POA: Insufficient documentation

## 2019-01-14 DIAGNOSIS — F17200 Nicotine dependence, unspecified, uncomplicated: Secondary | ICD-10-CM | POA: Insufficient documentation

## 2019-01-14 DIAGNOSIS — I10 Essential (primary) hypertension: Secondary | ICD-10-CM

## 2019-01-14 DIAGNOSIS — E66812 Obesity, class 2: Secondary | ICD-10-CM | POA: Insufficient documentation

## 2019-01-14 DIAGNOSIS — E782 Mixed hyperlipidemia: Secondary | ICD-10-CM

## 2019-01-14 DIAGNOSIS — R4586 Emotional lability: Secondary | ICD-10-CM

## 2019-01-14 DIAGNOSIS — Z6836 Body mass index (BMI) 36.0-36.9, adult: Secondary | ICD-10-CM

## 2019-01-14 DIAGNOSIS — N951 Menopausal and female climacteric states: Secondary | ICD-10-CM

## 2019-01-14 DIAGNOSIS — Z9889 Other specified postprocedural states: Secondary | ICD-10-CM

## 2019-01-14 DIAGNOSIS — J449 Chronic obstructive pulmonary disease, unspecified: Secondary | ICD-10-CM

## 2019-01-14 DIAGNOSIS — R42 Dizziness and giddiness: Secondary | ICD-10-CM

## 2019-01-14 MED ORDER — VARENICLINE TARTRATE 0.5 MG X 11 & 1 MG X 42 PO MISC: ORAL | 0 refills | Status: DC

## 2019-01-14 MED ORDER — MECLIZINE HCL 25 MG PO TABS: 25.0000 mg | ORAL_TABLET | Freq: Two times a day (BID) | ORAL | 0 refills | Status: DC | PRN

## 2019-01-14 MED ORDER — OMEPRAZOLE 20 MG PO CPDR: 20.0000 mg | DELAYED_RELEASE_CAPSULE | ORAL | 0 refills | Status: DC

## 2019-01-14 MED ORDER — VARENICLINE TARTRATE 1 MG PO TABS: 1.0000 mg | ORAL_TABLET | Freq: Two times a day (BID) | ORAL | 2 refills | Status: DC

## 2019-01-14 MED ORDER — FAMOTIDINE 20 MG PO TABS: 20.0000 mg | ORAL_TABLET | Freq: Two times a day (BID) | ORAL | 1 refills | Status: DC

## 2019-01-14 MED ORDER — HYDROCHLOROTHIAZIDE 25 MG PO TABS: 25.0000 mg | ORAL_TABLET | Freq: Every day | ORAL | 1 refills | Status: DC

## 2019-01-14 MED ORDER — VENLAFAXINE HCL ER 37.5 MG PO CP24: 37.5000 mg | ORAL_CAPSULE | Freq: Every day | ORAL | 1 refills | Status: DC

## 2019-01-14 NOTE — Assessment & Plan Note (Signed)
Taper off of Omeprazole and onto famotidine.

## 2019-01-14 NOTE — Assessment & Plan Note (Signed)
Discussed increased risk.

## 2019-01-14 NOTE — Assessment & Plan Note (Signed)
Lengthy discussion regarding labs, increased MI/CVA risk, and statin therapy. She will try statin medication.

## 2019-01-14 NOTE — Patient Instructions (Addendum)
To help with reflux: - Elevate your head of bed - either with a few extra pillows, a wedge pillow, or by placing two bed risers under the head of bed posts. - Avoid the following foods: citrus, fatty foods, chocolate, peppermint, and excessive alcohol, along with sodas, orange juice (acidic drinks) - Stop eating at least 3 hours before going to bed, minimize naps/laying down after eating. - No smoking. - If you are overweight or obese, exercising and losing weight will also help your symptoms. - Caution: prolonged use of proton pump inhibitors like omeprazole (Prilosec), pantoprazole (Protonix), esomeprazole (Nexium), and others like Dexilant and Aciphex may increase your risk of pneumonia, Clostridium difficile colitis, osteoporosis, anemia and other health complications   DASH Eating Plan DASH stands for "Dietary Approaches to Stop Hypertension." The DASH eating plan is a healthy eating plan that has been shown to reduce high blood pressure (hypertension). It may also reduce your risk for type 2 diabetes, heart disease, and stroke. The DASH eating plan may also help with weight loss. What are tips for following this plan?  General guidelines  Avoid eating more than 2,300 mg (milligrams) of salt (sodium) a day. If you have hypertension, you may need to reduce your sodium intake to 1,500 mg a day.  Limit alcohol intake to no more than 1 drink a day for nonpregnant women and 2 drinks a day for men. One drink equals 12 oz of beer, 5 oz of wine, or 1 oz of hard liquor.  Work with your health care provider to maintain a healthy body weight or to lose weight. Ask what an ideal weight is for you.  Get at least 30 minutes of exercise that causes your heart to beat faster (aerobic exercise) most days of the week. Activities may include walking, swimming, or biking.  Work with your health care provider or diet and nutrition specialist (dietitian) to adjust your eating plan to your individual calorie  needs. Reading food labels   Check food labels for the amount of sodium per serving. Choose foods with less than 5 percent of the Daily Value of sodium. Generally, foods with less than 300 mg of sodium per serving fit into this eating plan.  To find whole grains, look for the word "whole" as the first word in the ingredient list. Shopping  Buy products labeled as "low-sodium" or "no salt added."  Buy fresh foods. Avoid canned foods and premade or frozen meals. Cooking  Avoid adding salt when cooking. Use salt-free seasonings or herbs instead of table salt or sea salt. Check with your health care provider or pharmacist before using salt substitutes.  Do not fry foods. Cook foods using healthy methods such as baking, boiling, grilling, and broiling instead.  Cook with heart-healthy oils, such as olive, canola, soybean, or sunflower oil. Meal planning  Eat a balanced diet that includes: ? 5 or more servings of fruits and vegetables each day. At each meal, try to fill half of your plate with fruits and vegetables. ? Up to 6-8 servings of whole grains each day. ? Less than 6 oz of lean meat, poultry, or fish each day. A 3-oz serving of meat is about the same size as a deck of cards. One egg equals 1 oz. ? 2 servings of low-fat dairy each day. ? A serving of nuts, seeds, or beans 5 times each week. ? Heart-healthy fats. Healthy fats called Omega-3 fatty acids are found in foods such as flaxseeds and coldwater fish, like  sardines, salmon, and mackerel.  Limit how much you eat of the following: ? Canned or prepackaged foods. ? Food that is high in trans fat, such as fried foods. ? Food that is high in saturated fat, such as fatty meat. ? Sweets, desserts, sugary drinks, and other foods with added sugar. ? Full-fat dairy products.  Do not salt foods before eating.  Try to eat at least 2 vegetarian meals each week.  Eat more home-cooked food and less restaurant, buffet, and fast  food.  When eating at a restaurant, ask that your food be prepared with less salt or no salt, if possible. What foods are recommended? The items listed may not be a complete list. Talk with your dietitian about what dietary choices are best for you. Grains Whole-grain or whole-wheat bread. Whole-grain or whole-wheat pasta. Brown rice. [place]. Bulgur. Whole-grain and low-sodium cereals. Pita bread. Low-fat, low-sodium crackers. Whole-wheat flour tortillas. Vegetables Fresh or frozen vegetables (raw, steamed, roasted, or grilled). Low-sodium or reduced-sodium tomato and vegetable juice. Low-sodium or reduced-sodium tomato sauce and tomato paste. Low-sodium or reduced-sodium canned vegetables. Fruits All fresh, dried, or frozen fruit. Canned fruit in natural juice (without added sugar). Meat and other protein foods Skinless chicken or [place]. Ground chicken or [place]. Pork with fat trimmed off. Fish and seafood. Egg whites. Dried beans, peas, or lentils. Unsalted nuts, nut butters, and seeds. Unsalted canned beans. Lean cuts of beef with fat trimmed off. Low-sodium, lean deli meat. Dairy Low-fat (1%) or fat-free (skim) milk. Fat-free, low-fat, or reduced-fat cheeses. Nonfat, low-sodium ricotta or cottage cheese. Low-fat or nonfat yogurt. Low-fat, low-sodium cheese. Fats and oils Soft margarine without trans fats. Vegetable oil. Low-fat, reduced-fat, or light mayonnaise and salad dressings (reduced-sodium). Canola, safflower, olive, soybean, and sunflower oils. Avocado. Seasoning and other foods Herbs. Spices. Seasoning mixes without salt. Unsalted popcorn and pretzels. Fat-free sweets. What foods are not recommended? The items listed may not be a complete list. Talk with your dietitian about what dietary choices are best for you. Grains Baked goods made with fat, such as croissants, muffins, or some breads. Dry pasta or rice meal packs. Vegetables Creamed or fried vegetables. Vegetables  in a cheese sauce. Regular canned vegetables (not low-sodium or reduced-sodium). Regular canned tomato sauce and paste (not low-sodium or reduced-sodium). Regular tomato and vegetable juice (not low-sodium or reduced-sodium). Angie Fava. Olives. Fruits Canned fruit in a light or heavy syrup. Fried fruit. Fruit in cream or butter sauce. Meat and other protein foods Fatty cuts of meat. Ribs. Fried meat. Berniece Salines. Sausage. Bologna and other processed lunch meats. Salami. Fatback. Hotdogs. Bratwurst. Salted nuts and seeds. Canned beans with added salt. Canned or smoked fish. Whole eggs or egg yolks. Chicken or [place] with skin. Dairy Whole or 2% milk, cream, and half-and-half. Whole or full-fat cream cheese. Whole-fat or sweetened yogurt. Full-fat cheese. Nondairy creamers. Whipped toppings. Processed cheese and cheese spreads. Fats and oils Butter. Stick margarine. Lard. Shortening. Ghee. Bacon fat. Tropical oils, such as coconut, palm kernel, or palm oil. Seasoning and other foods Salted popcorn and pretzels. Onion salt, garlic salt, seasoned salt, table salt, and sea salt. Worcestershire sauce. Tartar sauce. Barbecue sauce. Teriyaki sauce. Soy sauce, including reduced-sodium. Steak sauce. Canned and packaged gravies. Fish sauce. Oyster sauce. Cocktail sauce. Horseradish that you find on the shelf. Ketchup. Mustard. Meat flavorings and tenderizers. Bouillon cubes. Hot sauce and Tabasco sauce. Premade or packaged marinades. Premade or packaged taco seasonings. Relishes. Regular salad dressings. Where to find more information:  National Heart, Lung, and Blood Institute: https://wilson-eaton.com/  American Heart Association: www.heart.org Summary  The DASH eating plan is a healthy eating plan that has been shown to reduce high blood pressure (hypertension). It may also reduce your risk for type 2 diabetes, heart disease, and stroke.  With the DASH eating plan, you should limit salt (sodium) intake to 2,300 mg a  day. If you have hypertension, you may need to reduce your sodium intake to 1,500 mg a day.  When on the DASH eating plan, aim to eat more fresh fruits and vegetables, whole grains, lean proteins, low-fat dairy, and heart-healthy fats.  Work with your health care provider or diet and nutrition specialist (dietitian) to adjust your eating plan to your individual calorie needs. This information is not intended to replace advice given to you by your health care provider. Make sure you discuss any questions you have with your health care provider. Document Released: 11/01/2011 Document Revised: 11/05/2016 Document Reviewed: 11/05/2016 Elsevier Interactive Patient Education  2019 [employer].   Diabetes Mellitus and Nutrition, Adult When you have diabetes (diabetes mellitus), it is very important to have healthy eating habits because your blood sugar (glucose) levels are greatly affected by what you eat and drink. Eating healthy foods in the appropriate amounts, at about the same times every day, can help you:  Control your blood glucose.  Lower your risk of heart disease.  Improve your blood pressure.  Reach or maintain a healthy weight. Every person with diabetes is different, and each person has different needs for a meal plan. Your health care provider may recommend that you work with a diet and nutrition specialist (dietitian) to make a meal plan that is best for you. Your meal plan may vary depending on factors such as:  The calories you need.  The medicines you take.  Your weight.  Your blood glucose, blood pressure, and cholesterol levels.  Your activity level.  Other health conditions you have, such as heart or kidney disease. How do carbohydrates affect me? Carbohydrates, also called carbs, affect your blood glucose level more than any other type of food. Eating carbs naturally raises the amount of glucose in your blood. Carb counting is a method for keeping track of how many  carbs you eat. Counting carbs is important to keep your blood glucose at a healthy level, especially if you use insulin or take certain oral diabetes medicines. It is important to know how many carbs you can safely have in each meal. This is different for every person. Your dietitian can help you calculate how many carbs you should have at each meal and for each snack. Foods that contain carbs include:  Bread, cereal, rice, pasta, and crackers.  Potatoes and corn.  Peas, beans, and lentils.  Milk and yogurt.  Fruit and juice.  Desserts, such as cakes, cookies, ice cream, and candy. How does alcohol affect me? Alcohol can cause a sudden decrease in blood glucose (hypoglycemia), especially if you use insulin or take certain oral diabetes medicines. Hypoglycemia can be a life-threatening condition. Symptoms of hypoglycemia (sleepiness, dizziness, and confusion) are similar to symptoms of having too much alcohol. If your health care provider says that alcohol is safe for you, follow these guidelines:  Limit alcohol intake to no more than 1 drink per day for nonpregnant women and 2 drinks per day for men. One drink equals 12 oz of beer, 5 oz of wine, or 1 oz of hard liquor.  Do not drink on  an empty stomach.  Keep yourself hydrated with water, diet soda, or unsweetened iced tea.  Keep in mind that regular soda, juice, and other mixers may contain a lot of sugar and must be counted as carbs. What are tips for following this plan?  Reading food labels  Start by checking the serving size on the "Nutrition Facts" label of packaged foods and drinks. The amount of calories, carbs, fats, and other nutrients listed on the label is based on one serving of the item. Many items contain more than one serving per package.  Check the total grams (g) of carbs in one serving. You can calculate the number of servings of carbs in one serving by dividing the total carbs by 15. For example, if a food has 30  g of total carbs, it would be equal to 2 servings of carbs.  Check the number of grams (g) of saturated and trans fats in one serving. Choose foods that have low or no amount of these fats.  Check the number of milligrams (mg) of salt (sodium) in one serving. Most people should limit total sodium intake to less than 2,300 mg per day.  Always check the nutrition information of foods labeled as "low-fat" or "nonfat". These foods may be higher in added sugar or refined carbs and should be avoided.  Talk to your dietitian to identify your daily goals for nutrients listed on the label. Shopping  Avoid buying canned, premade, or processed foods. These foods tend to be high in fat, sodium, and added sugar.  Shop around the outside edge of the grocery store. This includes fresh fruits and vegetables, bulk grains, fresh meats, and fresh dairy. Cooking  Use low-heat cooking methods, such as baking, instead of high-heat cooking methods like deep frying.  Cook using healthy oils, such as olive, canola, or sunflower oil.  Avoid cooking with butter, cream, or high-fat meats. Meal planning  Eat meals and snacks regularly, preferably at the same times every day. Avoid going long periods of time without eating.  Eat foods high in fiber, such as fresh fruits, vegetables, beans, and whole grains. Talk to your dietitian about how many servings of carbs you can eat at each meal.  Eat 4-6 ounces (oz) of lean protein each day, such as lean meat, chicken, fish, eggs, or tofu. One oz of lean protein is equal to: ? 1 oz of meat, chicken, or fish. ? 1 egg. ?  cup of tofu.  Eat some foods each day that contain healthy fats, such as avocado, nuts, seeds, and fish. Lifestyle  Check your blood glucose regularly.  Exercise regularly as told by your health care provider. This may include: ? 150 minutes of moderate-intensity or vigorous-intensity exercise each week. This could be brisk walking, biking, or water  aerobics. ? Stretching and doing strength exercises, such as yoga or weightlifting, at least 2 times a week.  Take medicines as told by your health care provider.  Do not use any products that contain nicotine or tobacco, such as cigarettes and e-cigarettes. If you need help quitting, ask your health care provider.  Work with a Social worker or diabetes educator to identify strategies to manage stress and any emotional and social challenges. Questions to ask a health care provider  Do I need to meet with a diabetes educator?  Do I need to meet with a dietitian?  What number can I call if I have questions?  When are the best times to check my blood glucose?  Where to find more information:  American Diabetes Association: diabetes.org  Academy of Nutrition and Dietetics: www.eatright.[employer] of Diabetes and Digestive and Kidney Diseases (NIH): [employer] Summary  A healthy meal plan will help you control your blood glucose and maintain a healthy lifestyle.  Working with a diet and nutrition specialist (dietitian) can help you make a meal plan that is best for you.  Keep in mind that carbohydrates (carbs) and alcohol have immediate effects on your blood glucose levels. It is important to count carbs and to use alcohol carefully. This information is not intended to replace advice given to you by your health care provider. Make sure you discuss any questions you have with your health care provider. Document Released: 08/09/2005 Document Revised: 06/12/2017 Document Reviewed: 12/17/2016 Elsevier Interactive Patient Education  2019 [place] and Cholesterol Restricted Eating Plan Getting too much fat and cholesterol in your diet may cause health problems. Choosing the right foods helps keep your fat and cholesterol at normal levels. This can keep you from getting certain diseases. Your doctor may recommend an eating plan that includes:  Total fat: ______%  or less of total calories a day.  Saturated fat: ______% or less of total calories a day.  Cholesterol: less than _________mg a day.  Fiber: ______g a day. What are tips for following this plan? Meal planning  At meals, divide your plate into four equal parts: ? Fill one-half of your plate with vegetables and green salads. ? Fill one-fourth of your plate with whole grains. ? Fill one-fourth of your plate with low-fat (lean) protein foods.  Eat fish that is high in omega-3 fats at least two times a week. This includes mackerel, tuna, sardines, and salmon.  Eat foods that are high in fiber, such as whole grains, beans, apples, broccoli, carrots, peas, and barley. General tips   Work with your doctor to lose weight if you need to.  Avoid: ? Foods with added sugar. ? Fried foods. ? Foods with partially hydrogenated oils.  Limit alcohol intake to no more than 1 drink a day for nonpregnant women and 2 drinks a day for men. One drink equals 12 oz of beer, 5 oz of wine, or 1 oz of hard liquor. Reading food labels  Check food labels for: ? Trans fats. ? Partially hydrogenated oils. ? Saturated fat (g) in each serving. ? Cholesterol (mg) in each serving. ? Fiber (g) in each serving.  Choose foods with healthy fats, such as: ? Monounsaturated fats. ? Polyunsaturated fats. ? Omega-3 fats.  Choose grain products that have whole grains. Look for the word "whole" as the first word in the ingredient list. Cooking  Cook foods using low-fat methods. These include baking, boiling, grilling, and broiling.  Eat more home-cooked foods. Eat at restaurants and buffets less often.  Avoid cooking using saturated fats, such as butter, cream, palm oil, palm kernel oil, and coconut oil. Recommended foods  Fruits  All fresh, canned (in natural juice), or frozen fruits. Vegetables  Fresh or frozen vegetables (raw, steamed, roasted, or grilled). Green salads. Grains  Whole grains, such  as whole wheat or whole grain breads, crackers, cereals, and pasta. Unsweetened oatmeal, bulgur, barley, quinoa, or brown rice. Corn or whole wheat flour tortillas. Meats and other protein foods  Ground beef (85% or leaner), grass-fed beef, or beef trimmed of fat. Skinless chicken or [place]. Ground chicken or [place]. Pork trimmed of fat. All fish and seafood. Egg whites. Dried  beans, peas, or lentils. Unsalted nuts or seeds. Unsalted canned beans. Nut butters without added sugar or oil. Dairy  Low-fat or nonfat dairy products, such as skim or 1% milk, 2% or reduced-fat cheeses, low-fat and fat-free ricotta or cottage cheese, or plain low-fat and nonfat yogurt. Fats and oils  Tub margarine without trans fats. Light or reduced-fat mayonnaise and salad dressings. Avocado. Olive, canola, sesame, or safflower oils. The items listed above may not be a complete list of foods and beverages you can eat. Contact a dietitian for more information. Foods to avoid Fruits  Canned fruit in heavy syrup. Fruit in cream or butter sauce. Fried fruit. Vegetables  Vegetables cooked in cheese, cream, or butter sauce. Fried vegetables. Grains  White bread. White pasta. White rice. Cornbread. Bagels, pastries, and croissants. Crackers and snack foods that contain trans fat and hydrogenated oils. Meats and other protein foods  Fatty cuts of meat. Ribs, chicken wings, bacon, sausage, bologna, salami, chitterlings, fatback, hot dogs, bratwurst, and packaged lunch meats. Liver and organ meats. Whole eggs and egg yolks. Chicken and [place] with skin. Fried meat. Dairy  Whole or 2% milk, cream, half-and-half, and cream cheese. Whole milk cheeses. Whole-fat or sweetened yogurt. Full-fat cheeses. Nondairy creamers and whipped toppings. Processed cheese, cheese spreads, and cheese curds. Beverages  Alcohol. Sugar-sweetened drinks such as sodas, lemonade, and fruit drinks. Fats and oils  Butter, stick margarine, lard,  shortening, ghee, or bacon fat. Coconut, palm kernel, and palm oils. Sweets and desserts  Corn syrup, sugars, honey, and molasses. Candy. Jam and jelly. Syrup. Sweetened cereals. Cookies, pies, cakes, donuts, muffins, and ice cream. The items listed above may not be a complete list of foods and beverages you should avoid. Contact a dietitian for more information. Summary  Choosing the right foods helps keep your fat and cholesterol at normal levels. This can keep you from getting certain diseases.  At meals, fill one-half of your plate with vegetables and green salads.  Eat high-fiber foods, like whole grains, beans, apples, carrots, peas, and barley.  Limit added sugar, saturated fats, alcohol, and fried foods. This information is not intended to replace advice given to you by your health care provider. Make sure you discuss any questions you have with your health care provider. Document Released: 05/13/2012 Document Revised: 07/16/2018 Document Reviewed: 07/30/2017 Elsevier Interactive Patient Education  2019 [employer].

## 2019-01-14 NOTE — Assessment & Plan Note (Signed)
Symbicort daily, stable

## 2019-01-14 NOTE — Assessment & Plan Note (Signed)
Maintain current dosing of HCTZ.

## 2019-01-14 NOTE — Assessment & Plan Note (Signed)
Meclizine PRN

## 2019-01-14 NOTE — Assessment & Plan Note (Signed)
Discussed importance of 150 minutes of physical activity weekly, eat two servings of fish weekly, eat one serving of tree nuts ( cashews, pistachios, pecans, almonds..) every other day, eat 6 servings of fruit/vegetables daily and drink plenty of water and avoid sweet beverages. 

## 2019-01-14 NOTE — Progress Notes (Signed)
Established Patient Office Visit  Subjective:  Patient ID: Joan Davis, female    DOB: 01-03-1966  Age: 53 y.o. MRN: [id number]  CC:  Chief Complaint  Patient presents with  . Hypertension  . Medication Refill  . Obesity  . Mood Swings    HPI Joan Davis presents for routine follow up.  Last routine follow up was 07/25/2018.  She has not been seen in our office since 08/25/2018  R Knee Arhtroscopy: performed 12/09/18. Doing well after surgery, she is finished with PT, back at the gym. Walking unassisted.  HTN:  -does take medications as prescribed - current regimen includes HCTZ 25mg .  - taking medications as instructed, no medication side effects noted, no TIAs, no chest pain on exertion, no dyspnea on exertion, no swelling of ankles, no orthostatic lightheadedness, no orthopnea or paroxysmal nocturnal dyspnea, no palpitations - DASH diet discussed - pt does not follow a low sodium diet; salt not added to cooking and salt shaker not on table - The following  are contributing factors: stress, sedentary lifestyle, saturated fats in diet, smoking  Hyperlipidemia: Current Medication Regimen: Last Lipids: Lab Results  Component Value Date   CHOL 200 (H) 07/25/2018   HDL 49 (L) 07/25/2018   LDLCALC 130 (H) 07/25/2018   TRIG 99 07/25/2018   CHOLHDL 4.1 07/25/2018   - Current Diet: - Denies: Chest pain, shortness of breath, myalgias. - Documented aortic atherosclerosis? No - Risk factors for atherosclerosis: hypercholesterolemia and hypertension  The 10-year ASCVD risk score Mikey Bussing DC Jr., et al., 2013) is: 12.4%   Values used to calculate the score:     Age: 52 years     Sex: Female     Is Non-Hispanic African American: Yes     Diabetic: No     Tobacco smoker: Yes     Systolic Blood Pressure: 756 mmHg     Is BP treated: Yes     HDL Cholesterol: 49 mg/dL     Total Cholesterol: 200 mg/dL She never started statin therapy that was rx'd after last follow up. Lengthy  discussion regarding SE's and her concerns.  She states will try taking the medication, but also wants to work on lifestyle modifications.  COPD: She was diagnosed a while ago at [facility], but has not had any issues lately. She is prescribed Symbicort, but does not take it on a regular basis.  Denies shortness of breath, chest pain.  She is still smoking about 12 cigarettes/day.  Wants to trial Chantix - we will rx today.  Vertigo: Still having bouts of vertigo - happening maybes 1-2 times a week. She is not taking her antivert because she has been out of it due to not following up. No facial droop, numbness/tingling, confusion, or extremity weakness.  Depressed Mood: Mood swings, irritability, snapping at people recently, and hot flashes.  She had hysterectomy 5 years ago due to fibroid uterus.  She does have 1 ovary still intact.  We will trial effexor to help with symptoms.  Obesity: Body mass index is 39.21 kg/m. Weight Management History: Diet: frequent dining out, excessive fat intake and limited physical activity Exercise: moderately active - She is going to [employer] as of last week (just completed PT for knee surgery), she is trying to do cardio, abs, and some weights.  Co-Morbid Conditions: dyslipidemias and hypertension; 2 or more of these conditions combined with BMI >30 is considered morbid obesity; is this diagnosis appropriate and/or added to patient's problem  list? Yes  GERD: Taking omeprazole, doing well on this. Discussed risk of long term use, so we will taper off of this and onto famotidine. No dysphagia, no blood in stool/dark and tarry stool, no abdominal pain.  Past Medical History:  Diagnosis Date  . COPD (chronic obstructive pulmonary disease) ([place])   . GERD (gastroesophageal reflux disease)   . Hypertension   . Vertigo     Past Surgical History:  Procedure Laterality Date  . CHOLECYSTECTOMY    . CHONDROPLASTY Right 12/09/2018   Procedure: CHONDROPLASTY  LATERAL FEMORAL CONDYLE;  Surgeon: Thornton Park, MD;  Location: [facility] ORS;  Service: Orthopedics;  Laterality: Right;  . FOREIGN BODY REMOVAL N/A 07/15/2016   Procedure: FOREIGN BODY REMOVAL;  Surgeon: Lucilla Lame, MD;  Location: [facility] ENDOSCOPY;  Service: Endoscopy;  Laterality: N/A;  . KNEE ARTHROSCOPY Right 12/09/2018   Procedure: ARTHROSCOPY KNEE WITH PARTIAL LATERAL MENISCECTOMY;  Surgeon: Thornton Park, MD;  Location: [facility] ORS;  Service: Orthopedics;  Laterality: Right;  . TOTAL ABDOMINAL HYSTERECTOMY      Family History  Problem Relation Age of Onset  . Alcohol abuse Mother   . Heart disease Father   . Hypertension Father   . Hypertension Brother   . Hyperlipidemia Brother   . Asthma Maternal Grandmother   . Stroke Maternal Grandfather   . Hypertension Maternal Grandfather   . Congestive Heart Failure Paternal Grandmother   . Heart disease Paternal Grandfather   . Congestive Heart Failure Paternal Grandfather     Social History   Socioeconomic History  . Marital status: Single    Spouse name: Not on file  . Number of children: Not on file  . Years of education: Not on file  . Highest education level: Not on file  Occupational History  . Not on file  Social Needs  . Financial resource strain: Not on file  . Food insecurity:    Worry: Not on file    Inability: Not on file  . Transportation needs:    Medical: Not on file    Non-medical: Not on file  Tobacco Use  . Smoking status: Current Every Day Smoker    Packs/day: 0.50    Years: 31.00    Pack years: 15.50    Types: Cigarettes  . Smokeless tobacco: Never Used  Substance and Sexual Activity  . Alcohol use: No  . Drug use: No  . Sexual activity: Yes  Lifestyle  . Physical activity:    Days per week: Not on file    Minutes per session: Not on file  . Stress: Not on file  Relationships  . Social connections:    Talks on phone: Not on file    Gets together: Not on file    Attends religious service:  Not on file    Active member of club or organization: Not on file    Attends meetings of clubs or organizations: Not on file    Relationship status: Not on file  . Intimate partner violence:    Fear of current or ex partner: Not on file    Emotionally abused: Not on file    Physically abused: Not on file    Forced sexual activity: Not on file  Other Topics Concern  . Not on file  Social History Narrative  . Not on file    Outpatient Medications Prior to Visit  Medication Sig Dispense Refill  . aspirin EC 325 MG tablet Take 1 tablet (325 mg total) by mouth daily. [place]  tablet 0  . atorvastatin (LIPITOR) 20 MG tablet Take 1 tablet (20 mg total) by mouth daily. 90 tablet 3  . budesonide-formoterol (SYMBICORT) 160-4.5 MCG/ACT inhaler Inhale 2 puffs into the lungs 2 (two) times daily. 1 Inhaler 2  . hydrochlorothiazide (HYDRODIURIL) 25 MG tablet Take 1 tablet (25 mg total) by mouth daily. 90 tablet 0  . meclizine (ANTIVERT) 25 MG tablet Take 1 tablet (25 mg total) by mouth 3 (three) times daily as needed for dizziness. 10 tablet 0  . omeprazole (PRILOSEC) 20 MG capsule Take 1 capsule (20 mg total) by mouth daily. (Patient taking differently: Take 20 mg by mouth every morning. ) 90 capsule 0  . HYDROcodone-acetaminophen (NORCO) 5-325 MG tablet Take 1 tablet by mouth every 4 (four) hours as needed for moderate pain. (Patient not taking: Reported on 01/14/2019) 40 tablet 0  . ondansetron (ZOFRAN) 4 MG tablet Take 1 tablet (4 mg total) by mouth every 8 (eight) hours as needed for nausea or vomiting. (Patient not taking: Reported on 01/14/2019) 30 tablet 0   No facility-administered medications prior to visit.     No Known Allergies  ROS Review of Systems Constitutional: Negative for fever or weight change.  Respiratory: Negative for cough and shortness of breath.   Cardiovascular: Negative for chest pain or palpitations.  Gastrointestinal: Negative for abdominal pain, no bowel changes.    Musculoskeletal: Negative for gait problem or joint swelling.  Skin: Negative for rash.  Neurological: Negative for dizziness or headache.  No other specific complaints in a complete review of systems (except as listed in HPI above).   Objective:    Physical Exam  Constitutional: She is oriented to person, place, and time. She appears well-developed and well-nourished. No distress.  HENT:  Head: Normocephalic and atraumatic.  Right Ear: External ear normal.  Left Ear: External ear normal.  Nose: Nose normal.  Mouth/Throat: Oropharynx is clear and moist. No oropharyngeal exudate.  Eyes: Pupils are equal, round, and reactive to light. Conjunctivae and EOM are normal.  Neck: Normal range of motion. Neck supple. No JVD present. No thyromegaly present.  Cardiovascular: Normal rate and regular rhythm. Exam reveals no gallop and no friction rub.  No murmur heard. Pulmonary/Chest: Breath sounds normal. She has no wheezes. She has no rales. She exhibits no tenderness.  Musculoskeletal: Normal range of motion.        General: No tenderness or edema.  Lymphadenopathy:    She has no cervical adenopathy.  Neurological: She is alert and oriented to person, place, and time. No cranial nerve deficit.  Skin: Skin is warm and dry. No rash noted.  Psychiatric: She has a normal mood and affect. Her behavior is normal. Judgment and thought content normal.  Nursing note and vitals reviewed.   BP 140/86 (BP Location: Right Arm, Patient Position: Sitting, Cuff Size: Large)   Pulse 86   Temp 98.2 F (36.8 C) (Oral)   Resp 18   Ht 5\' 9"  (1.753 m)   Wt 265 lb 8 oz (120.4 kg)   SpO2 98%   BMI 39.21 kg/m  Wt Readings from Last 3 Encounters:  01/14/19 265 lb 8 oz (120.4 kg)  12/09/18 259 lb 3.2 oz (117.6 kg)  08/25/18 257 lb 11.2 oz (116.9 kg)     Health Maintenance Due  Topic Date Due  . MAMMOGRAM  12/04/2016    There are no preventive care reminders to display for this patient.  Lab  Results  Component Value Date  TSH 0.56 08/25/2018   Lab Results  Component Value Date   WBC 8.0 12/08/2018   HGB 13.9 12/08/2018   HCT 43.6 12/08/2018   MCV 95.8 12/08/2018   PLT 334 12/08/2018   Lab Results  Component Value Date   NA 140 12/08/2018   K 3.5 12/08/2018   CO2 26 12/08/2018   GLUCOSE 90 12/08/2018   BUN 13 12/08/2018   CREATININE 0.71 12/08/2018   BILITOT 0.4 08/25/2018   ALKPHOS 58 04/30/2018   AST 16 08/25/2018   ALT 16 08/25/2018   PROT 6.9 08/25/2018   ALBUMIN 3.7 04/30/2018   CALCIUM 9.3 12/08/2018   ANIONGAP 8 12/08/2018   Lab Results  Component Value Date   CHOL 200 (H) 07/25/2018   Lab Results  Component Value Date   HDL 49 (L) 07/25/2018   Lab Results  Component Value Date   LDLCALC 130 (H) 07/25/2018   Lab Results  Component Value Date   TRIG 99 07/25/2018   Lab Results  Component Value Date   CHOLHDL 4.1 07/25/2018   Lab Results  Component Value Date   HGBA1C 5.3 07/25/2018      Assessment & Plan:   Problem List Items Addressed This Visit      Cardiovascular and Mediastinum   Hypertension - Primary    Maintain current dosing of HCTZ.      Relevant Medications   hydrochlorothiazide (HYDRODIURIL) 25 MG tablet   Other Relevant Orders   COMPLETE METABOLIC PANEL WITH GFR   TSH     Respiratory   COPD (chronic obstructive pulmonary disease) (HCC)    Symbicort daily, stable      Relevant Medications   varenicline (CHANTIX STARTING MONTH PAK) 0.5 MG X 11 & 1 MG X 42 tablet   varenicline (CHANTIX CONTINUING MONTH PAK) 1 MG tablet     Digestive   Gastroesophageal reflux disease without esophagitis    Taper off of Omeprazole and onto famotidine.      Relevant Medications   omeprazole (PRILOSEC) 20 MG capsule   meclizine (ANTIVERT) 25 MG tablet   famotidine (PEPCID) 20 MG tablet     Other   Vertigo    Meclizine PRN      Relevant Medications   meclizine (ANTIVERT) 25 MG tablet   Other Relevant Orders   TSH     Mixed hyperlipidemia    Lengthy discussion regarding labs, increased MI/CVA risk, and statin therapy. She will try statin medication.      Relevant Medications   hydrochlorothiazide (HYDRODIURIL) 25 MG tablet   Other Relevant Orders   Lipid panel   H/O arthroscopy of right knee   Class 2 severe obesity due to excess calories with serious comorbidity and body mass index (BMI) of 36.0 to 36.9 in adult [facility])    Discussed importance of 150 minutes of physical activity weekly, eat two servings of fish weekly, eat one serving of tree nuts ( cashews, pistachios, pecans, almonds.Marland Kitchen) every other day, eat 6 servings of fruit/vegetables daily and drink plenty of water and avoid sweet beverages.       Relevant Orders   Lipid panel   COMPLETE METABOLIC PANEL WITH GFR   Hemoglobin A1c   TSH   Morbid obesity ([place])    Discussed increased risk.      Mood swings   Relevant Medications   venlafaxine XR (EFFEXOR XR) 37.5 MG 24 hr capsule   Hot flushes, perimenopausal   Relevant Medications   venlafaxine XR (EFFEXOR XR) 37.5  MG 24 hr capsule   Tobacco use disorder    Chantix, cessation discussed in detail. Advised to choose quit date and work on decreasing usage.      Relevant Medications   varenicline (CHANTIX STARTING MONTH PAK) 0.5 MG X 11 & 1 MG X 42 tablet   varenicline (CHANTIX CONTINUING MONTH PAK) 1 MG tablet      Meds ordered this encounter  Medications  . omeprazole (PRILOSEC) 20 MG capsule    Sig: Take 1 capsule (20 mg total) by mouth every other day.    Dispense:  45 capsule    Refill:  0    Order Specific Question:   Supervising Provider    Answer:   Steele Sizer [[id number]]  . hydrochlorothiazide (HYDRODIURIL) 25 MG tablet    Sig: Take 1 tablet (25 mg total) by mouth daily.    Dispense:  90 tablet    Refill:  1    Order Specific Question:   Supervising Provider    Answer:   Steele Sizer [[id number]]  . meclizine (ANTIVERT) 25 MG tablet    Sig: Take 1 tablet (25 mg total) by  mouth 2 (two) times daily as needed for dizziness.    Dispense:  30 tablet    Refill:  0    Order Specific Question:   Supervising Provider    Answer:   Steele Sizer [[id number]]  . venlafaxine XR (EFFEXOR XR) 37.5 MG 24 hr capsule    Sig: Take 1 capsule (37.5 mg total) by mouth daily with breakfast.    Dispense:  90 capsule    Refill:  1    Order Specific Question:   Supervising Provider    Answer:   Steele Sizer [[id number]]  . varenicline (CHANTIX STARTING MONTH PAK) 0.5 MG X 11 & 1 MG X 42 tablet    Sig: Take one 0.5 mg tablet by mouth once daily for 3 days, then increase to one 0.5 mg tablet twice daily for 4 days, then increase to one 1 mg tablet twice daily.    Dispense:  53 tablet    Refill:  0    Order Specific Question:   Supervising Provider    Answer:   Steele Sizer [[id number]]  . varenicline (CHANTIX CONTINUING MONTH PAK) 1 MG tablet    Sig: Take 1 tablet (1 mg total) by mouth 2 (two) times daily.    Dispense:  30 tablet    Refill:  2    Order Specific Question:   Supervising Provider    Answer:   Steele Sizer [[id number]]  . famotidine (PEPCID) 20 MG tablet    Sig: Take 1 tablet (20 mg total) by mouth 2 (two) times daily.    Dispense:  90 tablet    Refill:  1    Order Specific Question:   Supervising Provider    Answer:   Steele Sizer [[id number]]   Follow-up: Return in about 6 months (around 07/15/2019) for follow up w/ CPE - 20min appt.   Hubbard Hartshorn, FNP

## 2019-01-14 NOTE — Assessment & Plan Note (Signed)
Chantix, cessation discussed in detail. Advised to choose quit date and work on decreasing usage.

## 2019-01-15 LAB — COMPLETE METABOLIC PANEL WITH GFR
AG Ratio: 1.5 (calc) (ref 1.0–2.5)
ALT: 19 U/L (ref 6–29)
AST: 18 U/L (ref 10–35)
Albumin: 4 g/dL (ref 3.6–5.1)
Alkaline phosphatase (APISO): 62 U/L (ref 37–153)
BUN: 14 mg/dL (ref 7–25)
CO2: 30 mmol/L (ref 20–32)
CREATININE: 0.84 mg/dL (ref 0.50–1.05)
Calcium: 9.2 mg/dL (ref 8.6–10.4)
Chloride: 105 mmol/L (ref 98–110)
GFR, Est African American: 93 mL/min/{1.73_m2} (ref >=60)
GFR, Est Non African American: 80 mL/min/{1.73_m2} (ref >=60)
GLUCOSE: 87 mg/dL (ref 65–99)
Globulin: 2.7 g/dL (calc) (ref 1.9–3.7)
Potassium: 3.8 mmol/L (ref 3.5–5.3)
Sodium: 142 mmol/L (ref 135–146)
Total Bilirubin: 0.3 mg/dL (ref 0.2–1.2)
Total Protein: 6.7 g/dL (ref 6.1–8.1)

## 2019-01-15 LAB — HEMOGLOBIN A1C
EAG (MMOL/L): 5.8 (calc)
Hgb A1c MFr Bld: 5.3 % of total Hgb (ref <5.7)
Mean Plasma Glucose: 105 (calc)

## 2019-01-15 LAB — TSH: TSH: 0.42 m[IU]/L

## 2019-01-15 LAB — LIPID PANEL
Cholesterol: 198 mg/dL (ref <200)
HDL: 56 mg/dL (ref >=50)
LDL Cholesterol (Calc): 117 mg/dL (calc) — ABNORMAL HIGH
Non-HDL Cholesterol (Calc): 142 mg/dL (calc) — ABNORMAL HIGH (ref <130)
Total CHOL/HDL Ratio: 3.5 (calc) (ref <5.0)
Triglycerides: 140 mg/dL (ref <150)

## 2019-03-13 ENCOUNTER — Other Ambulatory Visit: Payer: Self-pay

## 2019-03-13 ENCOUNTER — Encounter: Payer: Self-pay | Admitting: Family Medicine

## 2019-03-13 ENCOUNTER — Telehealth: Payer: Self-pay | Admitting: Family Medicine

## 2019-03-13 ENCOUNTER — Ambulatory Visit (INDEPENDENT_AMBULATORY_CARE_PROVIDER_SITE_OTHER): Payer: 59 | Admitting: Family Medicine

## 2019-03-13 DIAGNOSIS — R1013 Epigastric pain: Secondary | ICD-10-CM

## 2019-03-13 DIAGNOSIS — K219 Gastro-esophageal reflux disease without esophagitis: Secondary | ICD-10-CM | POA: Diagnosis not present

## 2019-03-13 MED ORDER — OMEPRAZOLE 20 MG PO CPDR: 20.0000 mg | DELAYED_RELEASE_CAPSULE | Freq: Two times a day (BID) | ORAL | 1 refills | Status: DC

## 2019-03-13 MED ORDER — OMEPRAZOLE 20 MG PO CPDR: 20.0000 mg | DELAYED_RELEASE_CAPSULE | ORAL | 1 refills | Status: DC

## 2019-03-13 NOTE — Telephone Encounter (Signed)
Pt said that she thought she was to take 2 aqid medication every day . The RX is incorrect

## 2019-03-13 NOTE — Progress Notes (Signed)
Name: Joan Davis   MRN: [id number]    DOB: 1966-02-20   Date:03/13/2019       Progress Note  Subjective  Chief Complaint  Chief Complaint  Patient presents with  . Gastroesophageal Reflux    Omeprazole was working but the current regimen- pepcid, is not as effective.    I connected with  Joan Davis on 03/13/19 at 12:40 PM EDT by telephone and verified that I am speaking with the correct person using two identifiers.   I discussed the limitations, risks, security and privacy concerns of performing an evaluation and management service by telephone and the availability of in person appointments. Staff also discussed with the patient that there may be a patient responsible charge related to this service. Patient Location: Home Provider Location: Home Additional Individuals present: None  HPI  GERD: At last visit in January 2020, we discussed long term risk of PPI use, and she wanted to trial tapering off of the omeprazole and onto pepcid.  No dysphagia, no blood in stool/dark and tarry stool, no abdominal pain.  Last night she started having sharp stabbing chest pain that lasted for hours - she vomited x2 because it felt like something was stuck in her esophagus, finally she drank some warm tea and this improved the pain.  Today her chest is a little sore.   She denies shortness of breath. She declines ER/UC at this time.   No blood in vomit, no blood in vomit, no dark and tarry stools.  Does endorse constipation (having daily BM, but they are harder than normal), no abdominal pain, but does endorse some epigastric pain on palpation.  Denies pain on the chest with palpation, no chest pain at present time.   Patient Active Problem List   Diagnosis Date Noted  . H/O arthroscopy of right knee 01/14/2019  . Class 2 severe obesity due to excess calories with serious comorbidity and body mass index (BMI) of 36.0 to 36.9 in adult ([place]) 01/14/2019  . Morbid obesity ([place]) 01/14/2019   . Mood swings 01/14/2019  . Hot flushes, perimenopausal 01/14/2019  . Tobacco use disorder 01/14/2019  . Gastroesophageal reflux disease without esophagitis 01/14/2019  . Mixed hyperlipidemia 07/29/2018  . Vertigo 02/06/2018  . COPD (chronic obstructive pulmonary disease) ([place]) 12/20/2017  . Hypertension 12/20/2017  . Food impaction of esophagus     Social History   Tobacco Use  . Smoking status: Current Every Day Smoker    Packs/day: 0.50    Years: 31.00    Pack years: 15.50    Types: Cigarettes  . Smokeless tobacco: Never Used  Substance Use Topics  . Alcohol use: No     Current Outpatient Medications:  .  aspirin EC 325 MG tablet, Take 1 tablet (325 mg total) by mouth daily., Disp: 30 tablet, Rfl: 0 .  atorvastatin (LIPITOR) 20 MG tablet, Take 1 tablet (20 mg total) by mouth daily., Disp: 90 tablet, Rfl: 3 .  budesonide-formoterol (SYMBICORT) 160-4.5 MCG/ACT inhaler, Inhale 2 puffs into the lungs 2 (two) times daily., Disp: 1 Inhaler, Rfl: 2 .  famotidine (PEPCID) 20 MG tablet, Take 1 tablet (20 mg total) by mouth 2 (two) times daily., Disp: 90 tablet, Rfl: 1 .  hydrochlorothiazide (HYDRODIURIL) 25 MG tablet, Take 1 tablet (25 mg total) by mouth daily., Disp: 90 tablet, Rfl: 1 .  meclizine (ANTIVERT) 25 MG tablet, Take 1 tablet (25 mg total) by mouth 2 (two) times daily as needed for dizziness., Disp: 30  tablet, Rfl: 0 .  omeprazole (PRILOSEC) 20 MG capsule, Take 1 capsule (20 mg total) by mouth every other day. (Patient not taking: Reported on 03/13/2019), Disp: 45 capsule, Rfl: 0 .  varenicline (CHANTIX CONTINUING MONTH PAK) 1 MG tablet, Take 1 tablet (1 mg total) by mouth 2 (two) times daily. (Patient not taking: Reported on 03/13/2019), Disp: 30 tablet, Rfl: 2 .  varenicline (CHANTIX STARTING MONTH PAK) 0.5 MG X 11 & 1 MG X 42 tablet, Take one 0.5 mg tablet by mouth once daily for 3 days, then increase to one 0.5 mg tablet twice daily for 4 days, then increase to one 1 mg  tablet twice daily. (Patient not taking: Reported on 03/13/2019), Disp: 53 tablet, Rfl: 0 .  venlafaxine XR (EFFEXOR XR) 37.5 MG 24 hr capsule, Take 1 capsule (37.5 mg total) by mouth daily with breakfast. (Patient not taking: Reported on 03/13/2019), Disp: 90 capsule, Rfl: 1  No Known Allergies  I personally reviewed active problem list, medication list, allergies, notes from last encounter, lab results with the patient/caregiver today.  ROS  Ten systems reviewed and is negative except as mentioned in HPI  Objective  Virtual encounter, vitals not obtained.  There is no height or weight on file to calculate BMI.  Nursing Note and Vital Signs reviewed.  Physical Exam  Pulmonary/Chest: Effort normal. No respiratory distress. Speaking in complete sentences Neurological: Pt is alert and oriented to person, place, and time. Speech is normal Psychiatric: Patient has a normal mood and affect. behavior is normal. Judgment and thought content normal.  No results found for this or any previous visit (from the past 72 hour(s)).  Assessment & Plan  1. Gastroesophageal reflux disease without esophagitis - Discussed the chest pain from the night prior, and she does not want to seek any immediate care and she believes this was reflux related and not cardiac, did discuss risk of not seeking care and she verbalizes understanding.  We will resume omeprazole and refer to GI.  Advised if chest pain recurs she must seek ER care immediately.   - omeprazole (PRILOSEC) 20 MG capsule; Take 1 capsule (20 mg total) by mouth every other day.  Dispense: 180 capsule; Refill: 1 - Ambulatory referral to Gastroenterology  -Red flags and when to present for emergency care or RTC including fever >101.35F, chest pain, shortness of breath, new/worsening/un-resolving symptoms, reviewed with patient at time of visit. Follow up and care instructions discussed and provided in AVS. - I discussed the assessment and treatment  plan with the patient. The patient was provided an opportunity to ask questions and all were answered. The patient agreed with the plan and demonstrated an understanding of the instructions.  - The patient was advised to call back or seek an in-person evaluation if the symptoms worsen or if the condition fails to improve as anticipated.  I provided 15 minutes of non-face-to-face time during this encounter.  Hubbard Hartshorn, FNP

## 2019-03-13 NOTE — Addendum Note (Signed)
Addended by: Hubbard Hartshorn on: 03/13/2019 01:10 PM   Modules accepted: Orders

## 2019-03-13 NOTE — Patient Instructions (Signed)

## 2019-03-13 NOTE — Telephone Encounter (Signed)
Already fixed and resent Rx

## 2019-03-20 ENCOUNTER — Encounter (INDEPENDENT_AMBULATORY_CARE_PROVIDER_SITE_OTHER): Payer: 59 | Admitting: Gastroenterology

## 2019-03-20 NOTE — Progress Notes (Signed)
no show

## 2019-03-23 ENCOUNTER — Ambulatory Visit (INDEPENDENT_AMBULATORY_CARE_PROVIDER_SITE_OTHER): Payer: 59 | Admitting: Gastroenterology

## 2019-03-23 ENCOUNTER — Telehealth: Payer: Self-pay | Admitting: Gastroenterology

## 2019-03-23 DIAGNOSIS — R079 Chest pain, unspecified: Secondary | ICD-10-CM

## 2019-03-23 DIAGNOSIS — K219 Gastro-esophageal reflux disease without esophagitis: Secondary | ICD-10-CM

## 2019-03-23 MED ORDER — OMEPRAZOLE 20 MG PO CPDR: 40.0000 mg | DELAYED_RELEASE_CAPSULE | Freq: Two times a day (BID) | ORAL | 1 refills | Status: DC

## 2019-03-23 NOTE — Progress Notes (Signed)
Joan Davis  [address]  [place], [place] [address]  Main: [phone]  Fax: [phone]   Gastroenterology Consultation  Referring Provider:     Hubbard Hartshorn, FNP Primary Care Physician:  Hubbard Hartshorn, FNP Reason for Consultation:     Dysphagia/GERD        HPI:   Virtual Visit via video  Note  I connected with patient on 03/23/19 at  9:30 AM EDT by video  and verified that I am speaking with the correct person using two identifiers.   I discussed the limitations, risks, security and privacy concerns of performing an evaluation and management service by video and the availability of in person appointments. I also discussed with the patient that there may be a patient responsible charge related to this service. The patient expressed understanding and agreed to proceed.  Location of the patient: Home Location of provider: Home Participating persons: Patient and provider only   History of Present Illness: Chief Complaint  Patient presents with  . Dysphagia    GERD     Joan Davis is a 53 y.o. y/o female referred for consultation & management  by Dr. Uvaldo Rising, Astrid Divine, FNP.    She says that a couple of months back she started having constant chest pain in the center of her chest, at times in her shoulder . Taking her omeprazole makes it feel better, when the effects wear off she has to take another. Sharp like a knife. In the night it is worse. Does not affect her swallowing. She is losing weight which she is doing intentionally. 1 episode of heart burn. No difference in exertion. Father has had heart attacks. She does get pain in her left shoulder when she gets the chest pain. She is a smoker . She also has HTN. 2 years back had to undergo an EGD for food imapction - was told she would need a dilation.   She is presently taking 20 mg prilosec BID  Past Medical History:  Diagnosis Date  . COPD (chronic obstructive pulmonary disease) ([place])   . GERD  (gastroesophageal reflux disease)   . Hypertension   . Vertigo     Past Surgical History:  Procedure Laterality Date  . CHOLECYSTECTOMY    . CHONDROPLASTY Right 12/09/2018   Procedure: CHONDROPLASTY LATERAL FEMORAL CONDYLE;  Surgeon: Thornton Park, MD;  Location: [facility] ORS;  Service: Orthopedics;  Laterality: Right;  . FOREIGN BODY REMOVAL N/A 07/15/2016   Procedure: FOREIGN BODY REMOVAL;  Surgeon: Lucilla Lame, MD;  Location: [facility] ENDOSCOPY;  Service: Endoscopy;  Laterality: N/A;  . KNEE ARTHROSCOPY Right 12/09/2018   Procedure: ARTHROSCOPY KNEE WITH PARTIAL LATERAL MENISCECTOMY;  Surgeon: Thornton Park, MD;  Location: [facility] ORS;  Service: Orthopedics;  Laterality: Right;  . TOTAL ABDOMINAL HYSTERECTOMY      Prior to Admission medications   Medication Sig Start Date End Date Taking? Authorizing Provider  aspirin EC 325 MG tablet Take 1 tablet (325 mg total) by mouth daily. 12/09/18  Yes Thornton Park, MD  atorvastatin (LIPITOR) 20 MG tablet Take 1 tablet (20 mg total) by mouth daily. 07/29/18  Yes Hubbard Hartshorn, FNP  budesonide-formoterol (SYMBICORT) 160-4.5 MCG/ACT inhaler Inhale 2 puffs into the lungs 2 (two) times daily. 07/25/18  Yes Hubbard Hartshorn, FNP  hydrochlorothiazide (HYDRODIURIL) 25 MG tablet Take 1 tablet (25 mg total) by mouth daily. 01/14/19  Yes Hubbard Hartshorn, FNP  meclizine (ANTIVERT) 25 MG tablet Take 1 tablet (  25 mg total) by mouth 2 (two) times daily as needed for dizziness. 01/14/19  Yes Hubbard Hartshorn, FNP  omeprazole (PRILOSEC) 20 MG capsule Take 1 capsule (20 mg total) by mouth 2 (two) times daily before a meal. 03/13/19  Yes Hubbard Hartshorn, FNP    Family History  Problem Relation Age of Onset  . Alcohol abuse Mother   . Heart disease Father   . Hypertension Father   . Hypertension Brother   . Hyperlipidemia Brother   . Asthma Maternal Grandmother   . Stroke Maternal Grandfather   . Hypertension Maternal Grandfather   . Congestive Heart Failure Paternal  Grandmother   . Heart disease Paternal Grandfather   . Congestive Heart Failure Paternal Grandfather      Social History   Tobacco Use  . Smoking status: Current Every Day Smoker    Packs/day: 0.50    Years: 31.00    Pack years: 15.50    Types: Cigarettes  . Smokeless tobacco: Never Used  Substance Use Topics  . Alcohol use: No  . Drug use: No    Allergies as of 03/23/2019  . (No Known Allergies)    Review of Systems:    All systems reviewed and negative except where noted in HPI. General Appearance:    Alert, cooperative, no distress, appears stated age  Head:    Normocephalic, without obvious abnormality, atraumatic  Eyes:    PERRL, conjunctiva/corneas clear,  Ears:    Grossly normal hearing    Neurologic:   Grossly appears normal     Observations/Objective:  Labs: CBC    Component Value Date/Time   WBC 8.0 12/08/2018 1031   RBC 4.55 12/08/2018 1031   HGB 13.9 12/08/2018 1031   HGB 12.9 07/25/2013 2049   HCT 43.6 12/08/2018 1031   HCT 38.0 07/25/2013 2049   PLT 334 12/08/2018 1031   PLT 328 07/25/2013 2049   MCV 95.8 12/08/2018 1031   MCV 94 07/25/2013 2049   MCH 30.5 12/08/2018 1031   MCHC 31.9 12/08/2018 1031   RDW 13.9 12/08/2018 1031   RDW 13.8 07/25/2013 2049   LYMPHSABS 2.8 12/08/2018 1031   MONOABS 0.4 12/08/2018 1031   EOSABS 0.1 12/08/2018 1031   BASOSABS 0.0 12/08/2018 1031   CMP     Component Value Date/Time   NA 142 01/14/2019 1413   NA 141 07/25/2013 2049   K 3.8 01/14/2019 1413   K 3.5 07/25/2013 2049   CL 105 01/14/2019 1413   CL 109 (H) 07/25/2013 2049   CO2 30 01/14/2019 1413   CO2 26 07/25/2013 2049   GLUCOSE 87 01/14/2019 1413   GLUCOSE 102 (H) 07/25/2013 2049   BUN 14 01/14/2019 1413   BUN 17 07/25/2013 2049   CREATININE 0.84 01/14/2019 1413   CALCIUM 9.2 01/14/2019 1413   CALCIUM 8.7 07/25/2013 2049   PROT 6.7 01/14/2019 1413   ALBUMIN 3.7 04/30/2018 1222   AST 18 01/14/2019 1413   ALT 19 01/14/2019 1413   ALKPHOS  58 04/30/2018 1222   BILITOT 0.3 01/14/2019 1413   GFRNONAA 80 01/14/2019 1413   GFRAA 93 01/14/2019 1413    Imaging Studies: No results found.  Assessment and Plan:   ELIDIA BONENFANT is a 53 y.o. y/o female has been referred for GERD. Describes her pain as central and at times radiating to her left shoulder. Pain relieved with PPI . She has a history of HTN and is a smoker and family history of CAD. H/o  food impaction requiring EGD 2 years back and was told she would need a dilationm.   Impression: Chest pain has features of cardiac and non cardiac etiology- with risk factors would need cardiac evaluation prior to any endoscopy   Plan :   1.    Increase prilosec to 40 mg BID for 2 weeks with plan to decrease after.  GERD : Counseled on life style changes, suggest to use PPI first thing in the morning on empty stomach and eat 30 minutes after. Advised on the use of a wedge pillow at night , avoid meals for 2 hours prior to bed time. Weight loss .Discussed the risks and benefits of long term PPI use including but not limited to bone loss, chronic kidney disease, infections , low magnesium . Aim to use at the lowest dose for the shortest period of time  Cardiology referral  EGD+/- dilation after cardiac clearance - schedule EGD in 4-6 weeks   I have discussed alternative options, risks & benefits,  which include, but are not limited to, bleeding, infection, perforation,respiratory complication & drug reaction.  The patient agrees with this plan & written consent will be obtained.      Follow Up Instructions:  F/u in 2-3 weeks   I discussed the assessment and treatment plan with the patient. The patient was provided an opportunity to ask questions and all were answered. The patient agreed with the plan and demonstrated an understanding of the instructions.   The patient was advised to call back or seek an in-person evaluation if the symptoms worsen or if the condition fails to  improve as anticipated.  I provided  minutes of face-to-face time during this encounter.   Dr Joan Bellows MD,MRCP [facility]) Gastroenterology/Hepatology Pager: [phone]   Speech recognition software was used to dictate the above note.

## 2019-03-23 NOTE — Telephone Encounter (Signed)
Left vm for pt to call office and schedule 3 week f/u Video apt Dr. Vicente Males

## 2019-05-05 ENCOUNTER — Other Ambulatory Visit: Payer: Self-pay

## 2019-05-05 DIAGNOSIS — R079 Chest pain, unspecified: Secondary | ICD-10-CM

## 2019-07-06 NOTE — Progress Notes (Signed)
New Outpatient Visit Date: 07/08/2019  Referring Provider: Jonathon Bellows, [address] [place],  [place] [address]  Chief Complaint: Chest pain  HPI:  Joan Davis is a 53 y.o. female who is being seen today for the evaluation of chest pain and perioperative cardiac risk in anticipation of EGD at the request of Dr. Vicente Males. She has a history of hypertension, GERD, COPD, and vertigo.   Today, Ms. Defibaugh reports that she is feeling relatively well.  She has been spacing intermittent chest discomfort, which has 2 distinct qualities.  Sometimes she feels a burning on her sternum that is reminiscent of heartburn.  It seems to improve with an antacid.  However, she notes some associated shortness of breath.  She also has noticed pain and tenderness along the inferior margin of her sternum and overlying the xiphoid process with light palpation.  She denies trauma to this area.  She has not had any exertional chest pain dyspnea, remaining very active at work.  Ms. Milhoan denies personal history of cardiac disease but notes that her father has significant CAD (he is a patient of mine).  She has experienced occasional palpitations that are unrelated to the aforementioned chest pain.  There are no associated symptoms and they are typically self-limited.  She has chronic lower extremity edema that worsens when she misses her HCTZ.  Edema seems to be worse at the [place] of the day, as she is on her feet a lot.  She was previously told that she had an esophageal stricture that may need to be dilated at some point.  She endorses occasional regurgitation, reinserting "early morning.  --------------------------------------------------------------------------------------------------  Cardiovascular History & Procedures: Cardiovascular Problems:  Chest pain  Risk Factors:  Hypertension, tobacco use, and family history  Cath/PCI:  None  CV Surgery:  None  EP Procedures and Devices:   None  Non-Invasive Evaluation(s):  None  Recent CV Pertinent Labs: Lab Results  Component Value Date   CHOL 198 01/14/2019   HDL 56 01/14/2019   LDLCALC 117 (H) 01/14/2019   TRIG 140 01/14/2019   CHOLHDL 3.5 01/14/2019   INR 0.94 12/08/2018   K 3.8 01/14/2019   K 3.5 07/25/2013   BUN 14 01/14/2019   BUN 17 07/25/2013   CREATININE 0.84 01/14/2019    --------------------------------------------------------------------------------------------------  Past Medical History:  Diagnosis Date  . COPD (chronic obstructive pulmonary disease) ([place])   . GERD (gastroesophageal reflux disease)   . Hyperlipidemia   . Hypertension   . Vertigo     Past Surgical History:  Procedure Laterality Date  . CHOLECYSTECTOMY    . CHONDROPLASTY Right 12/09/2018   Procedure: CHONDROPLASTY LATERAL FEMORAL CONDYLE;  Surgeon: Thornton Park, MD;  Location: [facility] ORS;  Service: Orthopedics;  Laterality: Right;  . FOREIGN BODY REMOVAL N/A 07/15/2016   Procedure: FOREIGN BODY REMOVAL;  Surgeon: Lucilla Lame, MD;  Location: [facility] ENDOSCOPY;  Service: Endoscopy;  Laterality: N/A;  . KNEE ARTHROSCOPY Right 12/09/2018   Procedure: ARTHROSCOPY KNEE WITH PARTIAL LATERAL MENISCECTOMY;  Surgeon: Thornton Park, MD;  Location: [facility] ORS;  Service: Orthopedics;  Laterality: Right;  . TOTAL ABDOMINAL HYSTERECTOMY      Current Meds  Medication Sig  . budesonide-formoterol (SYMBICORT) 160-4.5 MCG/ACT inhaler Inhale 2 puffs into the lungs 2 (two) times daily. (Patient taking differently: Inhale 2 puffs into the lungs 2 (two) times daily as needed. )  . Cyanocobalamin (VITAMIN B-12 PO) Take by mouth daily.  . hydrochlorothiazide (HYDRODIURIL) 25 MG tablet Take 1  tablet (25 mg total) by mouth daily.  . meclizine (ANTIVERT) 25 MG tablet Take 1 tablet (25 mg total) by mouth 2 (two) times daily as needed for dizziness.  . Multiple Vitamin (MULTIVITAMIN) capsule Take 1 capsule by mouth daily.  Marland Kitchen VITAMIN D PO Take by mouth  daily.    Allergies: Patient has no known allergies.  Social History   Tobacco Use  . Smoking status: Current Every Day Smoker    Packs/day: 0.50    Years: 31.00    Pack years: 15.50    Types: Cigarettes  . Smokeless tobacco: Never Used  Substance Use Topics  . Alcohol use: No  . Drug use: No    Family History  Problem Relation Age of Onset  . Alcohol abuse Mother   . Heart disease Father 39  . Hypertension Father   . Hypertension Brother   . Hyperlipidemia Brother   . Heart disease Brother 26  . Asthma Maternal Grandmother   . Stroke Maternal Grandfather   . Hypertension Maternal Grandfather   . Congestive Heart Failure Paternal Grandmother   . Heart disease Paternal Grandfather   . Congestive Heart Failure Paternal Grandfather     Review of Systems: A 12-system review of systems was performed and was negative except as noted in the HPI.  --------------------------------------------------------------------------------------------------  Physical Exam: BP 108/70 (BP Location: Right Arm, Patient Position: Sitting, Cuff Size: Large)   Pulse 76   Ht 5' 8.58" (1.742 m)   Wt 260 lb 8 oz (118.2 kg)   BMI 38.94 kg/m   General:  NAD HEENT: No conjunctival pallor or scleral icterus. Facemask in place Neck: Supple without lymphadenopathy, thyromegaly, JVD, or HJR. No carotid bruit. Lungs: Normal work of breathing. Clear to auscultation bilaterally without wheezes or crackles. Heart: Regular rate and rhythm without murmurs, rubs, or gallops.  Unable to assess PMI due to body habitus. Abd: Bowel sounds present. Soft with epigastric tenderness, particularly when palpating over the xyphoid process. Ext: Trace bilateral pretibial edema.. Radial, PT, and DP pulses are 2+ bilaterally Skin: Warm and dry without rash. Neuro: CNIII-XII intact. Strength and fine-touch sensation intact in upper and lower extremities bilaterally. Psych: Normal mood and affect.  EKG:  NSR with  isolated PVC and minimal non-specific ST changes.  Lab Results  Component Value Date   WBC 8.0 12/08/2018   HGB 13.9 12/08/2018   HCT 43.6 12/08/2018   MCV 95.8 12/08/2018   PLT 334 12/08/2018    Lab Results  Component Value Date   NA 142 01/14/2019   K 3.8 01/14/2019   CL 105 01/14/2019   CO2 30 01/14/2019   BUN 14 01/14/2019   CREATININE 0.84 01/14/2019   GLUCOSE 87 01/14/2019   ALT 19 01/14/2019    Lab Results  Component Value Date   CHOL 198 01/14/2019   HDL 56 01/14/2019   LDLCALC 117 (H) 01/14/2019   TRIG 140 01/14/2019   CHOLHDL 3.5 01/14/2019     --------------------------------------------------------------------------------------------------  ASSESSMENT AND PLAN: Chest pain: Discomfort is not consistent with angina, as it seem to be related at times to eating and improves with omeprazole.  She also has tenderness overlying the xyphoid and lower sternum, suggestive of a musculoskeletal component.  We discussed ischemia evaluation but have agreed to defer this given atypical nature of her symptoms.  She should continue treatment of GERD and proceed with planned GI workup.  Preop evaluation: Patient is scheduled for EDG, which a low risk procedure from a perioperative risk  perspective.  She is able to complete >4 METS of activity without angina or dyspnea.  I do not recommend any further cardiac testing or intervention at this time, as it would not significantly mitigate her periprocedural risk.  Hypertension: BP well-controlled.  Continued management per PCP.  Follow-up: Return to clinic as needed.  Nelva Bush, MD 07/09/2019 10:20 PM

## 2019-07-08 ENCOUNTER — Encounter: Payer: Self-pay | Admitting: Internal Medicine

## 2019-07-08 ENCOUNTER — Other Ambulatory Visit: Payer: Self-pay

## 2019-07-08 ENCOUNTER — Ambulatory Visit: Payer: 59 | Admitting: Internal Medicine

## 2019-07-08 VITALS — BP 108/70 | HR 76 | Ht 68.58 in | Wt 260.5 lb

## 2019-07-08 DIAGNOSIS — R0789 Other chest pain: Secondary | ICD-10-CM

## 2019-07-08 DIAGNOSIS — Z01810 Encounter for preprocedural cardiovascular examination: Secondary | ICD-10-CM | POA: Diagnosis not present

## 2019-07-08 DIAGNOSIS — I10 Essential (primary) hypertension: Secondary | ICD-10-CM

## 2019-07-08 NOTE — Patient Instructions (Signed)
Medication Instructions:  Your physician recommends that you continue on your current medications as directed. Please refer to the Current Medication list given to you today.  If you need a refill on your cardiac medications before your next appointment, please call your pharmacy.   Lab work: - None ordered.  If you have labs (blood work) drawn today and your tests are completely normal, you will receive your results only by: Marland Kitchen MyChart Message (if you have MyChart) OR . A paper copy in the mail If you have any lab test that is abnormal or we need to change your treatment, we will call you to review the results.  Testing/Procedures: - None ordered.   Follow-Up: At [facility] ([place] you and your health needs are our priority.  As part of our continuing mission to provide you with exceptional heart care, we have created designated Provider Care Teams.  These Care Teams include your primary Cardiologist (physician) and Advanced Practice Providers (APPs -  Physician Assistants and Nurse Practitioners) who all work together to provide you with the care you need, when you need it. You will need a follow up appointment in AS NEEDED.

## 2019-07-09 ENCOUNTER — Encounter: Payer: Self-pay | Admitting: Internal Medicine

## 2019-07-16 ENCOUNTER — Encounter: Payer: Self-pay | Admitting: Family Medicine

## 2019-07-27 ENCOUNTER — Telehealth: Payer: Self-pay | Admitting: Gastroenterology

## 2019-07-27 NOTE — Telephone Encounter (Signed)
Patient called stating she had received cardiac clearance by Dr End. What in the next step? PLEASE ADVISE.

## 2019-07-30 ENCOUNTER — Other Ambulatory Visit: Payer: Self-pay

## 2019-07-30 DIAGNOSIS — K219 Gastro-esophageal reflux disease without esophagitis: Secondary | ICD-10-CM

## 2019-07-30 NOTE — Telephone Encounter (Signed)
Pt returned your  Call and wants to call you back later

## 2019-07-30 NOTE — Telephone Encounter (Signed)
Called pt to schedule upper endoscopy procedure.  Unable to contact, LVM to return call

## 2019-08-04 ENCOUNTER — Telehealth: Payer: Self-pay | Admitting: Gastroenterology

## 2019-08-04 ENCOUNTER — Other Ambulatory Visit
Admission: RE | Admit: 2019-08-04 | Discharge: 2019-08-04 | Disposition: A | Discharge status: Home / Self Care | Payer: 59 | Source: Ambulatory Visit | Attending: Gastroenterology | Admitting: Gastroenterology

## 2019-08-04 ENCOUNTER — Other Ambulatory Visit: Payer: Self-pay

## 2019-08-04 DIAGNOSIS — Z20828 Contact with and (suspected) exposure to other viral communicable diseases: Secondary | ICD-10-CM | POA: Diagnosis not present

## 2019-08-04 DIAGNOSIS — Z01812 Encounter for preprocedural laboratory examination: Secondary | ICD-10-CM | POA: Insufficient documentation

## 2019-08-04 NOTE — Telephone Encounter (Signed)
Renee with the [facility] called stating patient has a procedure on 08-06-19. The cpt code listed in case entry is 43235 & the authorization with [facility] is CPT 43239. Please call to clarify [phone].

## 2019-08-05 LAB — SARS CORONAVIRUS 2 (TAT 6-24 HRS): SARS Coronavirus 2: NEGATIVE

## 2019-08-05 NOTE — Telephone Encounter (Signed)
Copied from [place] [phone]. Topic: General - Other >> Aug 05, 2019 10:23 AM Rainey Pines A wrote: Phineas Real from [employer] would like a callback from referral coordinator at [phone] today . Information requested is listed below:   Referral ID # [id number]  From today through to 02/01/2020

## 2019-08-05 NOTE — Telephone Encounter (Signed)
Copied from [place] [phone]. Topic: Referral - Status >> Aug 05, 2019 11:43 AM Alanda Slim E wrote: Reason for CRM: [facility] advised Pt that a reference number is needed for the referral sent to them on 4.27.20/ Pt has a procedure scheduled for tomorrow and they need ref# asap please call Pt and leave message or contact [facility]   I tried to contact Joan Davis to see how I can be of assistance to her but there was no answer.  A message was left for her to give our office a call back when she got the chance.

## 2019-08-06 ENCOUNTER — Ambulatory Visit: Payer: 59 | Admitting: Anesthesiology

## 2019-08-06 ENCOUNTER — Other Ambulatory Visit: Payer: Self-pay

## 2019-08-06 ENCOUNTER — Encounter
Admission: RE | Disposition: A | Discharge status: Home / Self Care | Payer: Self-pay | Source: Home / Self Care | Attending: Gastroenterology

## 2019-08-06 ENCOUNTER — Ambulatory Visit
Admission: RE | Admit: 2019-08-06 | Discharge: 2019-08-06 | Disposition: A | Discharge status: Home / Self Care | Payer: 59 | Attending: Gastroenterology | Admitting: Gastroenterology

## 2019-08-06 DIAGNOSIS — K219 Gastro-esophageal reflux disease without esophagitis: Secondary | ICD-10-CM | POA: Diagnosis not present

## 2019-08-06 DIAGNOSIS — J449 Chronic obstructive pulmonary disease, unspecified: Secondary | ICD-10-CM | POA: Insufficient documentation

## 2019-08-06 DIAGNOSIS — I10 Essential (primary) hypertension: Secondary | ICD-10-CM | POA: Diagnosis not present

## 2019-08-06 DIAGNOSIS — K228 Other specified diseases of esophagus: Secondary | ICD-10-CM | POA: Diagnosis not present

## 2019-08-06 DIAGNOSIS — F17210 Nicotine dependence, cigarettes, uncomplicated: Secondary | ICD-10-CM | POA: Insufficient documentation

## 2019-08-06 DIAGNOSIS — Z7951 Long term (current) use of inhaled steroids: Secondary | ICD-10-CM | POA: Diagnosis not present

## 2019-08-06 DIAGNOSIS — R1310 Dysphagia, unspecified: Secondary | ICD-10-CM | POA: Insufficient documentation

## 2019-08-06 DIAGNOSIS — Z79899 Other long term (current) drug therapy: Secondary | ICD-10-CM | POA: Diagnosis not present

## 2019-08-06 HISTORY — PX: ESOPHAGOGASTRODUODENOSCOPY (EGD) WITH PROPOFOL: SHX5813

## 2019-08-06 SURGERY — ESOPHAGOGASTRODUODENOSCOPY (EGD) WITH PROPOFOL
Anesthesia: General

## 2019-08-06 MED ORDER — PROPOFOL 500 MG/50ML IV EMUL
INTRAVENOUS | Status: AC
  Filled 2019-08-06: qty 50

## 2019-08-06 MED ORDER — LIDOCAINE HCL (CARDIAC) PF 100 MG/5ML IV SOSY
PREFILLED_SYRINGE | INTRAVENOUS | Status: DC | PRN
  Administered 2019-08-06: 50 mg via INTRAVENOUS

## 2019-08-06 MED ORDER — LIDOCAINE HCL (PF) 2 % IJ SOLN
INTRAMUSCULAR | Status: AC
  Filled 2019-08-06: qty 10

## 2019-08-06 MED ORDER — SODIUM CHLORIDE 0.9 % IV SOLN
INTRAVENOUS | Status: DC
  Administered 2019-08-06: 09:00:00 via INTRAVENOUS

## 2019-08-06 MED ORDER — PROPOFOL 500 MG/50ML IV EMUL
INTRAVENOUS | Status: DC | PRN
  Administered 2019-08-06: 130 ug/kg/min via INTRAVENOUS

## 2019-08-06 MED ORDER — PROPOFOL 10 MG/ML IV BOLUS
INTRAVENOUS | Status: DC | PRN
  Administered 2019-08-06: 23 mg via INTRAVENOUS
  Administered 2019-08-06: 90 mg via INTRAVENOUS
  Administered 2019-08-06: 23 mg via INTRAVENOUS

## 2019-08-06 NOTE — Anesthesia Postprocedure Evaluation (Signed)
Anesthesia Post Note  Patient: Joan Davis  Procedure(s) Performed: ESOPHAGOGASTRODUODENOSCOPY (EGD) WITH PROPOFOL (N/A )  Patient location during evaluation: PACU Anesthesia Type: General Level of consciousness: awake and alert Pain management: pain level controlled Vital Signs Assessment: post-procedure vital signs reviewed and stable Respiratory status: spontaneous breathing, nonlabored ventilation and respiratory function stable Cardiovascular status: blood pressure returned to baseline and stable Postop Assessment: no apparent nausea or vomiting Anesthetic complications: no     Last Vitals:  Vitals:   08/06/19 0940 08/06/19 0950  BP: (!) 146/91 (!) 163/84  Pulse: 71 68  Resp: 19 19  Temp:    SpO2: 100% 100%    Last Pain:  Vitals:   08/06/19 0920  TempSrc: Oral  PainSc:                  Durenda Hurt

## 2019-08-06 NOTE — Telephone Encounter (Signed)
Please forward this information to Wilfred Curtis who does our precepts here in the office. I'm the receptionist for [facility].  Thank you.

## 2019-08-06 NOTE — Op Note (Signed)
[facility] Gastroenterology Patient Name: Joan Davis Procedure Date: 08/06/2019 9:10 AM MRN: [id number] Account #: [id number] Date of Birth: [birth year] Admit Type: Outpatient Age: 53 Room: [facility] ENDO ROOM 4 Gender: Female Note Status: Finalized Procedure:            Upper GI endoscopy Indications:          Dysphagia Providers:            Jonathon Bellows MD, MD Referring MD:         No Local Md, MD (Referring MD) Medicines:            Monitored Anesthesia Care Complications:        No immediate complications. Procedure:            Pre-Anesthesia Assessment:                       - Prior to the procedure, a History and Physical was                        performed, and patient medications, allergies and                        sensitivities were reviewed. The patient's tolerance of                        previous anesthesia was reviewed.                       - The risks and benefits of the procedure and the                        sedation options and risks were discussed with the                        patient. All questions were answered and informed                        consent was obtained.                       - ASA Grade Assessment: II - A patient with mild                        systemic disease.                       After obtaining informed consent, the endoscope was                        passed under direct vision. Throughout the procedure,                        the patient's blood pressure, pulse, and oxygen                        saturations were monitored continuously. The Endoscope                        was introduced through the mouth, and advanced to the  third part of duodenum. The upper GI endoscopy was                        accomplished with ease. The patient tolerated the                        procedure well. Findings:      The examined duodenum was normal.      The stomach was normal.      The cardia and gastric  fundus were normal on retroflexion.      Islands of salmon-colored mucosa were present at 40 cm. The maximum       longitudinal extent of these esophageal mucosal changes was 0.9 cm in       length. Biopsies were taken with a cold forceps for histology.      [Extent] mild mucosal changes characterized by rings were found in the       entire esophagus. This was biopsied with a cold forceps for histology. Impression:           - Normal examined duodenum.                       - Normal stomach.                       - Salmon-colored mucosa suspicious for short-segment                        Barrett's esophagus. Biopsied.                       - Mucosal changes in the esophagus. Biopsied. Recommendation:       - Discharge patient to home (with escort).                       - Resume previous diet.                       - Continue present medications.                       - Await pathology results.                       - Return to my office in 4 weeks. Procedure Code(s):    --- Professional ---                       [phone], Esophagogastroduodenoscopy, flexible, transoral;                        with biopsy, single or multiple Diagnosis Code(s):    --- Professional ---                       K22.8, Other specified diseases of esophagus                       R13.10, Dysphagia, unspecified CPT copyright 2019 American Medical Association. All rights reserved. The codes documented in this report are preliminary and upon coder review may  be revised to meet current compliance requirements. Jonathon Bellows, MD Jonathon Bellows MD, MD 08/06/2019 9:25:17 AM This report has been signed electronically. Number of Addenda: 0 Note  Initiated On: 08/06/2019 9:10 AM Estimated Blood Loss: Estimated blood loss: none.      [facility]

## 2019-08-06 NOTE — Transfer of Care (Signed)
Immediate Anesthesia Transfer of Care Note  Patient: Joan Davis  Procedure(s) Performed: ESOPHAGOGASTRODUODENOSCOPY (EGD) WITH PROPOFOL (N/A )  Patient Location: PACU and Endoscopy Unit  Anesthesia Type:General  Level of Consciousness: drowsy  Airway & Oxygen Therapy: Patient Spontanous Breathing  Post-op Assessment: Report given to RN and Post -op Vital signs reviewed and stable  Post vital signs: Reviewed and stable  Last Vitals:  Vitals Value Taken Time  BP 115/69   Temp    Pulse 101 08/06/19 0926  Resp 19 08/06/19 0926  SpO2 98 % 08/06/19 0926  Vitals shown include unvalidated device data.  Last Pain:  Vitals:   08/06/19 0920  TempSrc: Oral  PainSc:          Complications: No apparent anesthesia complications

## 2019-08-06 NOTE — Anesthesia Preprocedure Evaluation (Addendum)
Anesthesia Evaluation  Patient identified by MRN, date of birth, ID band Patient awake    Reviewed: Allergy & Precautions, H&P , NPO status , Patient's Chart, lab work & pertinent test results  Airway Mallampati: II  TM Distance: >3 FB     Dental  (+) Missing   Pulmonary COPD, Current Smoker and Patient abstained from smoking.,           Cardiovascular hypertension,      Neuro/Psych negative neurological ROS  negative psych ROS   GI/Hepatic Neg liver ROS, GERD  Controlled,  Endo/Other  negative endocrine ROS  Renal/GU negative Renal ROS  negative genitourinary   Musculoskeletal   Abdominal   Peds  Hematology negative hematology ROS (+)   Anesthesia Other Findings Obese  Past Medical History: No date: COPD (chronic obstructive pulmonary disease) (HCC) No date: GERD (gastroesophageal reflux disease) No date: Hyperlipidemia No date: Hypertension No date: Vertigo  Past Surgical History: No date: CHOLECYSTECTOMY 12/09/2018: CHONDROPLASTY; Right     Comment:  Procedure: CHONDROPLASTY LATERAL FEMORAL CONDYLE;                Surgeon: Thornton Park, MD;  Location: [facility] ORS;                Service: Orthopedics;  Laterality: Right; 07/15/2016: FOREIGN BODY REMOVAL; N/A     Comment:  Procedure: FOREIGN BODY REMOVAL;  Surgeon: Lucilla Lame,               MD;  Location: [facility] END[facility]COPY;  Service: Endoscopy;                Laterality: N/A; 12/09/2018: KNEE ARTHROSCOPY; Right     Comment:  Procedure: ARTHROSCOPY KNEE WITH PARTIAL LATERAL               MENISCECTOMY;  Surgeon: Thornton Park, MD;  Location:               [facility] ORS;  Service: Orthopedics;  Laterality: Right; No date: TOTAL ABDOMINAL HYSTERECTOMY     Reproductive/Obstetrics negative OB ROS                            Anesthesia Physical Anesthesia Plan  ASA: II  Anesthesia Plan: General   Post-op Pain Management:     Induction:   PONV Risk Score and Plan: Propofol infusion and TIVA  Airway Management Planned: Nasal Cannula and Natural Airway  Additional Equipment:   Intra-op Plan:   Post-operative Plan:   Informed Consent: I have reviewed the patients History and Physical, chart, labs and discussed the procedure including the risks, benefits and alternatives for the proposed anesthesia with the patient or authorized representative who has indicated his/her understanding and acceptance.     Dental Advisory Given  Plan Discussed with: Anesthesiologist and CRNA  Anesthesia Plan Comments:       Anesthesia Quick Evaluation

## 2019-08-06 NOTE — H&P (Signed)
Joan Bellows, MD [address], [place], [place], [place], [address] [address], [place], [place], [place], [address] Phone: [phone]  Fax: [phone]  Primary Care Physician:  Joan Hartshorn, FNP   Pre-Procedure History & Physical: HPI:  Joan Davis is a 53 y.o. female is here for an endoscopy    Past Medical History:  Diagnosis Date  . COPD (chronic obstructive pulmonary disease) ([place])   . GERD (gastroesophageal reflux disease)   . Hyperlipidemia   . Hypertension   . Vertigo     Past Surgical History:  Procedure Laterality Date  . CHOLECYSTECTOMY    . CHONDROPLASTY Right 12/09/2018   Procedure: CHONDROPLASTY LATERAL FEMORAL CONDYLE;  Surgeon: Thornton Park, MD;  Location: [facility];  Service: Orthopedics;  Laterality: Right;  . FOREIGN BODY REMOVAL N/A 07/15/2016   Procedure: FOREIGN BODY REMOVAL;  Surgeon: Lucilla Lame, MD;  Location: [facility];  Service: Endoscopy;  Laterality: N/A;  . KNEE ARTHROSCOPY Right 12/09/2018   Procedure: ARTHROSCOPY KNEE WITH PARTIAL LATERAL MENISCECTOMY;  Surgeon: Thornton Park, MD;  Location: [facility];  Service: Orthopedics;  Laterality: Right;  . TOTAL ABDOMINAL HYSTERECTOMY      Prior to Admission medications   Medication Sig Start Date End Date Taking? Authorizing Provider  budesonide-formoterol (SYMBICORT) 160-4.5 MCG/ACT inhaler Inhale 2 puffs into the lungs 2 (two) times daily. Patient taking differently: Inhale 2 puffs into the lungs 2 (two) times daily as needed.  07/25/18  Yes Joan Hartshorn, FNP  Cyanocobalamin (VITAMIN B-12 PO) Take by mouth daily.   Yes [provider]  meclizine (ANTIVERT) 25 MG tablet Take 1 tablet (25 mg total) by mouth 2 (two) times daily as needed for dizziness. 01/14/19  Yes Joan Hartshorn, FNP  Multiple Vitamin (MULTIVITAMIN) capsule Take 1 capsule by mouth daily.   Yes [provider]  OMEPRAZOLE PO Take 1 tablet by mouth 2 (two) times daily.   Yes  [provider]  hydrochlorothiazide (HYDRODIURIL) 25 MG tablet Take 1 tablet (25 mg total) by mouth daily. 01/14/19   Joan Hartshorn, FNP  VITAMIN D PO Take by mouth daily.    [provider]    Allergies as of 07/31/2019  . (No Known Allergies)    Family History  Problem Relation Age of Onset  . Alcohol abuse Mother   . Heart disease Father 4  . Hypertension Father   . Hypertension Brother   . Hyperlipidemia Brother   . Heart disease Brother 60  . Asthma Maternal Grandmother   . Stroke Maternal Grandfather   . Hypertension Maternal Grandfather   . Congestive Heart Failure Paternal Grandmother   . Heart disease Paternal Grandfather   . Congestive Heart Failure Paternal Grandfather     Social History   Socioeconomic History  . Marital status: Single    Spouse name: Not on file  . Number of children: Not on file  . Years of education: Not on file  . Highest education level: Not on file  Occupational History  . Not on file  Social Needs  . Financial resource strain: Not on file  . Food insecurity    Worry: Not on file    Inability: Not on file  . Transportation needs    Medical: Not on file    Non-medical: Not on file  Tobacco Use  . Smoking status: Current Every Day Smoker    Packs/day: 0.50    Years: 31.00  Pack years: 15.50    Types: Cigarettes  . Smokeless tobacco: Never Used  Substance and Sexual Activity  . Alcohol use: No  . Drug use: No  . Sexual activity: Yes  Lifestyle  . Physical activity    Days per week: Not on file    Minutes per session: Not on file  . Stress: Not on file  Relationships  . Social Herbalist on phone: Not on file    Gets together: Not on file    Attends religious service: Not on file    Active member of club or organization: Not on file    Attends meetings of clubs or organizations: Not on file    Relationship status: Not on file  . Intimate partner violence    Fear of current or ex partner:  Not on file    Emotionally abused: Not on file    Physically abused: Not on file    Forced sexual activity: Not on file  Other Topics Concern  . Not on file  Social History Narrative  . Not on file    Review of Systems: See HPI, otherwise negative ROS  Physical Exam: BP (!) 146/88   Pulse 77   Temp 98.4 F (36.9 C) (Oral)   Resp (!) 22   Ht 5\' 8"  (1.727 m)   Wt 116.6 kg   SpO2 98%   BMI 39.08 kg/m  General:   Alert,  pleasant and cooperative in NAD Head:  Normocephalic and atraumatic. Neck:  Supple; no masses or thyromegaly. Lungs:  Clear throughout to auscultation, normal respiratory effort.    Heart:  +S1, +S2, Regular rate and rhythm, No edema. Abdomen:  Soft, nontender and nondistended. Normal bowel sounds, without guarding, and without rebound.   Neurologic:  Alert and  oriented x4;  grossly normal neurologically.  Impression/Plan: [name] is here for an endoscopy  to be performed for  evaluation of dysphagia     Risks, benefits, limitations, and alternatives regarding endoscopy and dilation have been reviewed with the patient.  Questions have been answered.  All parties agreeable.   Joan Bellows, MD  08/06/2019, 9:00 AM

## 2019-08-06 NOTE — Anesthesia Post-op Follow-up Note (Signed)
Anesthesia QCDR form completed.        

## 2019-08-07 ENCOUNTER — Encounter: Payer: Self-pay | Admitting: Gastroenterology

## 2019-08-07 LAB — SURGICAL PATHOLOGY

## 2019-08-14 ENCOUNTER — Ambulatory Visit (LOCAL_COMMUNITY_HEALTH_CENTER): Payer: 59

## 2019-08-14 ENCOUNTER — Other Ambulatory Visit: Payer: Self-pay

## 2019-08-14 DIAGNOSIS — Z111 Encounter for screening for respiratory tuberculosis: Secondary | ICD-10-CM

## 2019-08-17 ENCOUNTER — Ambulatory Visit (LOCAL_COMMUNITY_HEALTH_CENTER): Payer: 59

## 2019-08-17 ENCOUNTER — Other Ambulatory Visit: Payer: Self-pay

## 2019-08-17 DIAGNOSIS — Z111 Encounter for screening for respiratory tuberculosis: Secondary | ICD-10-CM

## 2019-08-17 LAB — TB SKIN TEST
Induration: 0 mm
TB Skin Test: NEGATIVE

## 2019-08-19 ENCOUNTER — Other Ambulatory Visit: Payer: Self-pay

## 2019-08-19 ENCOUNTER — Emergency Department
Admission: EM | Admit: 2019-08-19 | Discharge: 2019-08-19 | Discharge status: Against Medical Advice | Payer: 59 | Attending: Emergency Medicine | Admitting: Emergency Medicine

## 2019-08-19 ENCOUNTER — Telehealth: Payer: Self-pay | Admitting: Gastroenterology

## 2019-08-19 DIAGNOSIS — Z79899 Other long term (current) drug therapy: Secondary | ICD-10-CM | POA: Insufficient documentation

## 2019-08-19 DIAGNOSIS — R350 Frequency of micturition: Secondary | ICD-10-CM | POA: Diagnosis present

## 2019-08-19 DIAGNOSIS — I10 Essential (primary) hypertension: Secondary | ICD-10-CM | POA: Insufficient documentation

## 2019-08-19 DIAGNOSIS — J449 Chronic obstructive pulmonary disease, unspecified: Secondary | ICD-10-CM | POA: Insufficient documentation

## 2019-08-19 DIAGNOSIS — F17210 Nicotine dependence, cigarettes, uncomplicated: Secondary | ICD-10-CM | POA: Insufficient documentation

## 2019-08-19 LAB — URINALYSIS, ROUTINE W REFLEX MICROSCOPIC
Bacteria, UA: NONE SEEN
Bilirubin Urine: NEGATIVE
Glucose, UA: NEGATIVE mg/dL
Hgb urine dipstick: NEGATIVE
Ketones, ur: NEGATIVE mg/dL
Nitrite: NEGATIVE
Protein, ur: NEGATIVE mg/dL
Specific Gravity, Urine: 1.021 (ref 1.005–1.030)
pH: 5 (ref 5.0–8.0)

## 2019-08-19 LAB — WET PREP, GENITAL
Clue Cells Wet Prep HPF POC: NONE SEEN
Sperm: NONE SEEN
Trich, Wet Prep: NONE SEEN
Yeast Wet Prep HPF POC: NONE SEEN

## 2019-08-19 LAB — GLUCOSE, CAPILLARY: Glucose-Capillary: 85 mg/dL (ref 70–99)

## 2019-08-19 LAB — POCT PREGNANCY, URINE: Preg Test, Ur: NEGATIVE

## 2019-08-19 NOTE — Telephone Encounter (Signed)
Left vm to offer apt with Dr. Vicente Males in 4 weeks

## 2019-08-19 NOTE — ED Provider Notes (Signed)
[facility] Emergency Department Provider Note  ____________________________________________  Time seen: Approximately 9:27 PM  I have reviewed the triage vital signs and the nursing notes.   HISTORY  Chief Complaint Urinary Frequency and Urinary Retention    HPI Joan Davis is a 53 y.o. female presents to the emergency department with increased urinary frequency, changes in vaginal odor, dysuria and increased vaginal discharge.  Patient does state that she had unprotected sex approximately a month ago but states that she has absolutely no concerns for STDs and does not want to be tested for gonorrhea or chlamydia.  She denies low back pain or nausea/vomiting.  She has been afebrile at home.  No other alleviating measures have been attempted.        Past Medical History:  Diagnosis Date  . COPD (chronic obstructive pulmonary disease) ([place])   . GERD (gastroesophageal reflux disease)   . Hyperlipidemia   . Hypertension   . Vertigo     Patient Active Problem List   Diagnosis Date Noted  . H/O arthroscopy of right knee 01/14/2019  . Class 2 severe obesity due to excess calories with serious comorbidity and body mass index (BMI) of 36.0 to 36.9 in adult ([place]) 01/14/2019  . Morbid obesity ([place]) 01/14/2019  . Mood swings 01/14/2019  . Hot flushes, perimenopausal 01/14/2019  . Tobacco use disorder 01/14/2019  . Gastroesophageal reflux disease without esophagitis 01/14/2019  . Mixed hyperlipidemia 07/29/2018  . Vertigo 02/06/2018  . COPD (chronic obstructive pulmonary disease) ([place]) 12/20/2017  . Hypertension 12/20/2017  . Food impaction of esophagus     Past Surgical History:  Procedure Laterality Date  . CHOLECYSTECTOMY    . CHONDROPLASTY Right 12/09/2018   Procedure: CHONDROPLASTY LATERAL FEMORAL CONDYLE;  Surgeon: Thornton Park, MD;  Location: [facility] ORS;  Service: Orthopedics;  Laterality: Right;  . ESOPHAGOGASTRODUODENOSCOPY (EGD) WITH  PROPOFOL N/A 08/06/2019   Procedure: ESOPHAGOGASTRODUODENOSCOPY (EGD) WITH PROPOFOL;  Surgeon: Jonathon Bellows, MD;  Location: [facility] ENDOSCOPY;  Service: Gastroenterology;  Laterality: N/A;  . FOREIGN BODY REMOVAL N/A 07/15/2016   Procedure: FOREIGN BODY REMOVAL;  Surgeon: Lucilla Lame, MD;  Location: [facility] ENDOSCOPY;  Service: Endoscopy;  Laterality: N/A;  . KNEE ARTHROSCOPY Right 12/09/2018   Procedure: ARTHROSCOPY KNEE WITH PARTIAL LATERAL MENISCECTOMY;  Surgeon: Thornton Park, MD;  Location: [facility] ORS;  Service: Orthopedics;  Laterality: Right;  . TOTAL ABDOMINAL HYSTERECTOMY      Prior to Admission medications   Medication Sig Start Date End Date Taking? Authorizing Provider  budesonide-formoterol (SYMBICORT) 160-4.5 MCG/ACT inhaler Inhale 2 puffs into the lungs 2 (two) times daily. Patient taking differently: Inhale 2 puffs into the lungs 2 (two) times daily as needed.  07/25/18   Hubbard Hartshorn, FNP  Cyanocobalamin (VITAMIN B-12 PO) Take by mouth daily.    [provider]  hydrochlorothiazide (HYDRODIURIL) 25 MG tablet Take 1 tablet (25 mg total) by mouth daily. 01/14/19   Hubbard Hartshorn, FNP  meclizine (ANTIVERT) 25 MG tablet Take 1 tablet (25 mg total) by mouth 2 (two) times daily as needed for dizziness. 01/14/19   Hubbard Hartshorn, FNP  Multiple Vitamin (MULTIVITAMIN) capsule Take 1 capsule by mouth daily.    [provider]  OMEPRAZOLE PO Take 1 tablet by mouth 2 (two) times daily.    [provider]  VITAMIN D PO Take by mouth daily.    [provider]    Allergies Patient has no known allergies.  Family History  Problem Relation Age of  Onset  . Alcohol abuse Mother   . Heart disease Father 68  . Hypertension Father   . Hypertension Brother   . Hyperlipidemia Brother   . Heart disease Brother 43  . Asthma Maternal Grandmother   . Stroke Maternal Grandfather   . Hypertension Maternal Grandfather   . Congestive Heart Failure Paternal Grandmother    . Heart disease Paternal Grandfather   . Congestive Heart Failure Paternal Grandfather     Social History Social History   Tobacco Use  . Smoking status: Current Every Day Smoker    Packs/day: 0.50    Years: 31.00    Pack years: 15.50    Types: Cigarettes  . Smokeless tobacco: Never Used  Substance Use Topics  . Alcohol use: No  . Drug use: No     Review of Systems  Constitutional: No fever/chills Eyes: No visual changes. No discharge ENT: No upper respiratory complaints. Cardiovascular: no chest pain. Respiratory: no cough. No SOB. Gastrointestinal: No abdominal pain.  No nausea, no vomiting.  No diarrhea.  No constipation. Genitourinary: Patient has dysuria, increased urinary frequency and changes in vaginal discharge.  Musculoskeletal: Negative for musculoskeletal pain. Skin: Negative for rash, abrasions, lacerations, ecchymosis. Neurological: Negative for headaches, focal weakness or numbness.  ____________________________________________   PHYSICAL EXAM:  VITAL SIGNS: ED Triage Vitals  Enc Vitals Group     BP 08/19/19 1850 (!) 158/70     Pulse Rate 08/19/19 1850 65     Resp 08/19/19 1850 18     Temp 08/19/19 1850 98 F (36.7 C)     Temp Source 08/19/19 1850 Oral     SpO2 08/19/19 1850 100 %     Weight 08/19/19 1851 255 lb 11.7 oz (116 kg)     Height 08/19/19 1851 5\' 8"  (1.727 m)     Head Circumference --      Peak Flow --      Pain Score 08/19/19 1851 0     Pain Loc --      Pain Edu? --      Excl. in [place]? --      Constitutional: Alert and oriented. Well appearing and in no acute distress. Eyes: Conjunctivae are normal. PERRL. EOMI. Head: Atraumatic. Cardiovascular: Normal rate, regular rhythm. Normal S1 and S2.  Good peripheral circulation. Respiratory: Normal respiratory effort without tachypnea or retractions. Lungs CTAB. Good air entry to the bases with no decreased or absent breath sounds. Gastrointestinal: Bowel sounds 4 quadrants. Soft and  nontender to palpation. No guarding or rigidity. No palpable masses. No distention. No CVA tenderness. Genitourinary: Musculoskeletal: Full range of motion to all extremities. No gross deformities appreciated. Neurologic:  Normal speech and language. No gross focal neurologic deficits are appreciated.  Skin:  Skin is warm, dry and intact. No rash noted. Psychiatric: Mood and affect are normal. Speech and behavior are normal. Patient exhibits appropriate insight and judgement.   ____________________________________________   LABS (all labs ordered are listed, but only abnormal results are displayed)  Labs Reviewed  URINALYSIS, ROUTINE W REFLEX MICROSCOPIC - Abnormal; Notable for the following components:      Result Value   Color, Urine YELLOW (*)    APPearance HAZY (*)    Leukocytes,Ua TRACE (*)    All other components within normal limits  URINE CULTURE  WET PREP, GENITAL  GLUCOSE, CAPILLARY  CBG MONITORING, ED  POCT PREGNANCY, URINE   ____________________________________________  EKG   ____________________________________________  RADIOLOGY   No results found.  ____________________________________________  PROCEDURES  Procedure(s) performed:    Procedures    Medications - No data to display   ____________________________________________   INITIAL IMPRESSION / ASSESSMENT AND PLAN / ED COURSE  Pertinent labs & imaging results that were available during my care of the patient were reviewed by me and considered in my medical decision making (see chart for details).  Review of the [place] CSRS was performed in accordance of the [place] prior to dispensing any controlled drugs.           Assessment and Plan:  Increased urinary frequency 53 year old female presents to the emergency department with increased urinary frequency and dysuria for the past 2 to 3 days with changes in vaginal discharge and odor.  Urinalysis was noncontributory for cystitis.  Urine  culture is pending.  Wet prep revealed white blood cells but no clue cells.  Patient eloped from emergency department prior to review of wet prep.      ____________________________________________  FINAL CLINICAL IMPRESSION(S) / ED DIAGNOSES  Final diagnoses:  None      NEW MEDICATIONS STARTED DURING THIS VISIT:  ED Discharge Orders    None          This chart was dictated using voice recognition software/Dragon. Despite best efforts to proofread, errors can occur which can change the meaning. Any change was purely unintentional.    Lannie Fields, PA-C 08/19/19 2312    Lavonia Drafts, MD 08/24/19 [phone]

## 2019-08-19 NOTE — Telephone Encounter (Signed)
-----   Message from Shelby Mattocks, [place] sent at 08/19/2019  9:09 AM EDT ----- Regarding: Appointment Patient needs 4 week hospital follow up appointment with Dr. Vicente Males

## 2019-08-19 NOTE — ED Triage Notes (Signed)
Reporting frequency, and urgency X 4 days. Pt believes she has UTI. Pt alert and oriented X4, cooperative, RR even and unlabored, color WNL. Pt in NAD.

## 2019-08-19 NOTE — ED Notes (Signed)
Pt denies burning on urination but states it has been "uncomfortable" to urinate for about 2 days. States "felt like I had the flu Monday." Denies feeling this way now. C/o inc urination and incontinence.

## 2019-08-20 LAB — URINE CULTURE: Culture: <10000 — AB

## 2019-08-21 ENCOUNTER — Telehealth: Payer: Self-pay

## 2019-08-21 NOTE — Telephone Encounter (Signed)
-----   Message from Jonathon Bellows, MD sent at 08/18/2019 12:35 PM EDT ----- Sherald Hess please inform that the biopsies of the esophagus show features of reflux esophagitis.  I would suggest her to continue on her PPI and schedule an office visit in a few weeks to discuss her response to treatment.  C/c Hubbard Hartshorn, FNP   Dr Jonathon Bellows MD,MRCP [facility]) Gastroenterology/Hepatology Pager: [phone]

## 2019-08-21 NOTE — Telephone Encounter (Signed)
Spoke with pt and informed her of biopsy results and Dr. Georgeann Oppenheim suggestions. Pt agrees.

## 2019-08-24 ENCOUNTER — Ambulatory Visit: Payer: 59 | Admitting: Family Medicine

## 2019-09-08 ENCOUNTER — Telehealth: Payer: Self-pay | Admitting: Family Medicine

## 2019-09-08 DIAGNOSIS — F17200 Nicotine dependence, unspecified, uncomplicated: Secondary | ICD-10-CM

## 2019-09-08 DIAGNOSIS — J441 Chronic obstructive pulmonary disease with (acute) exacerbation: Secondary | ICD-10-CM

## 2019-09-08 NOTE — Telephone Encounter (Signed)
Patient notified

## 2019-09-08 NOTE — Telephone Encounter (Signed)
budesonide-formoterol (SYMBICORT) 160-4.5 MCG/ACT inhaler  Send to [facility]

## 2019-09-09 ENCOUNTER — Other Ambulatory Visit: Payer: Self-pay

## 2019-09-09 ENCOUNTER — Encounter: Payer: Self-pay | Admitting: Family Medicine

## 2019-09-09 ENCOUNTER — Ambulatory Visit (INDEPENDENT_AMBULATORY_CARE_PROVIDER_SITE_OTHER): Payer: 59 | Admitting: Family Medicine

## 2019-09-09 DIAGNOSIS — Z20828 Contact with and (suspected) exposure to other viral communicable diseases: Secondary | ICD-10-CM

## 2019-09-09 DIAGNOSIS — F17200 Nicotine dependence, unspecified, uncomplicated: Secondary | ICD-10-CM

## 2019-09-09 DIAGNOSIS — J441 Chronic obstructive pulmonary disease with (acute) exacerbation: Secondary | ICD-10-CM | POA: Diagnosis not present

## 2019-09-09 DIAGNOSIS — Z20822 Contact with and (suspected) exposure to covid-19: Secondary | ICD-10-CM

## 2019-09-09 MED ORDER — BUDESONIDE-FORMOTEROL FUMARATE 160-4.5 MCG/ACT IN AERO: 2.0000 | INHALATION_SPRAY | Freq: Two times a day (BID) | RESPIRATORY_TRACT | 2 refills | Status: DC

## 2019-09-09 MED ORDER — ALBUTEROL SULFATE HFA 108 (90 BASE) MCG/ACT IN AERS: 2.0000 | INHALATION_SPRAY | Freq: Four times a day (QID) | RESPIRATORY_TRACT | 2 refills | Status: DC | PRN

## 2019-09-09 MED ORDER — GUAIFENESIN ER 600 MG PO TB12: 600.0000 mg | ORAL_TABLET | Freq: Two times a day (BID) | ORAL | 0 refills | Status: DC

## 2019-09-09 MED ORDER — BENZONATATE 100 MG PO CAPS: 100.0000 mg | ORAL_CAPSULE | Freq: Three times a day (TID) | ORAL | 0 refills | Status: DC | PRN

## 2019-09-09 NOTE — Progress Notes (Signed)
Name: Joan Davis   MRN: [id number]    DOB: [birth year]   Date:09/09/2019       Progress Note  Subjective  Chief Complaint  Chief Complaint  Patient presents with  . 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   I discussed the limitations, risks, security and privacy concerns of performing an evaluation and management service by telephone and the availability of in person appointments. Staff also discussed with the patient that there may be a patient responsible charge related to this service. Patient Location: Home Provider Location: Home Office Additional Individuals present: None  HPI  Pt presents with concern for respiratory symptoms that started 3 days ago on 09/07/2019.  She is having shortness of breath, chest congestion, and some nasal congestion.  She has COPD, still smoking.  Denies fevers, sore throat, loss of taste/smell, abdominal pain NVD, or headache.  No known exposure to COVID-19, however she does work in a health care setting with elderly patients.   Patient Active Problem List   Diagnosis Date Noted  . H/O arthroscopy of right knee 01/14/2019  . Class 2 severe obesity due to excess calories with serious comorbidity and body mass index (BMI) of 36.0 to 36.9 in adult ([place]) 01/14/2019  . Morbid obesity ([place]) 01/14/2019  . Mood swings 01/14/2019  . Hot flushes, perimenopausal 01/14/2019  . Tobacco use disorder 01/14/2019  . Gastroesophageal reflux disease without esophagitis 01/14/2019  . Mixed hyperlipidemia 07/29/2018  . Vertigo 02/06/2018  . COPD (chronic obstructive pulmonary disease) ([place]) 12/20/2017  . Hypertension 12/20/2017  . Food impaction of esophagus     Social History   Tobacco Use  . Smoking status: Current Every Day Smoker    Packs/day: 0.50    Years: 31.00   Pack years: 15.50    Types: Cigarettes  . Smokeless tobacco: Never Used  Substance Use Topics  . Alcohol use: No     Current Outpatient Medications:  .  budesonide-formoterol (SYMBICORT) 160-4.5 MCG/ACT inhaler, Inhale 2 puffs into the lungs 2 (two) times daily. (Patient taking differently: Inhale 2 puffs into the lungs 2 (two) times daily as needed. ), Disp: 1 Inhaler, Rfl: 2 .  Cyanocobalamin (VITAMIN B-12 PO), Take by mouth daily., Disp: , Rfl:  .  hydrochlorothiazide (HYDRODIURIL) 25 MG tablet, Take 1 tablet (25 mg total) by mouth daily., Disp: 90 tablet, Rfl: 1 .  Multiple Vitamin (MULTIVITAMIN) capsule, Take 1 capsule by mouth daily., Disp: , Rfl:  .  OMEPRAZOLE PO, Take 1 tablet by mouth 2 (two) times daily., Disp: , Rfl:  .  meclizine (ANTIVERT) 25 MG tablet, Take 1 tablet (25 mg total) by mouth 2 (two) times daily as needed for dizziness. (Patient not taking: Reported on 09/09/2019), Disp: 30 tablet, Rfl: 0 .  VITAMIN D PO, Take by mouth daily., Disp: , Rfl:   No Known Allergies  I personally reviewed active problem list, medication list, allergies, notes from last encounter, lab results with the patient/caregiver today.  ROS  Ten systems reviewed and is negative except as mentioned in HPI  Objective  Virtual encounter, vitals not obtained.  There is no height or weight on file to calculate BMI.  Nursing Note and Vital Signs reviewed.  Physical Exam  Pulmonary/Chest: Effort normal. No respiratory distress.  Speaking in complete sentences.  Cough present during visit. Neurological: Pt is alert and oriented to person, place, and time. Speech is normal. Psychiatric: Patient has a normal mood and affect. behavior is normal. Judgment and thought content normal.    No results found for this or any previous visit (from the past 72 hour(s)).  Assessment & Plan  1. Chronic obstructive pulmonary disease, unspecified COPD type (HCC) - budesonide-formoterol (SYMBICORT)  160-4.5 MCG/ACT inhaler; Inhale 2 puffs into the lungs 2 (two) times daily.  Dispense: 1 Inhaler; Refill: 2 - albuterol (VENTOLIN HFA) 108 (90 Base) MCG/ACT inhaler; Inhale 2 puffs into the lungs every 6 (six) hours as needed for wheezing or shortness of breath.  Dispense: 18 g; Refill: 2 - guaiFENesin (MUCINEX) 600 MG 12 hr tablet; Take 1 tablet (600 mg total) by mouth 2 (two) times daily.  Dispense: 20 tablet; Refill: 0 - benzonatate (TESSALON PERLES) 100 MG capsule; Take 1 capsule (100 mg total) by mouth 3 (three) times daily as needed.  Dispense: 20 capsule; Refill: 0  2. Tobacco use disorder - Recommend cessation; not ready to quit at this time.  3. Suspected COVID-19 virus infection - Strict quarantine until testing is back; will also keep her out of work until symptom improvement and at least 10 days from symptom onset and afebrile x72 hours - Novel Coronavirus, NAA ([facility])  -Red flags and when to present for emergency care or RTC including fever >101.42F, chest pain, shortness of breath, new/worsening/un-resolving symptoms, reviewed with patient at time of visit. Follow up and care instructions discussed and provided in AVS. - I discussed the assessment and treatment plan with the patient. The patient was provided an opportunity to ask questions and all were answered. The patient agreed with the plan and demonstrated an understanding of the instructions.  - The patient was advised to call back or seek an in-person evaluation if the symptoms worsen or if the condition fails to improve as anticipated.  I provided 18 minutes of non-face-to-face time during this encounter.  Hubbard Hartshorn, FNP

## 2019-09-10 LAB — NOVEL CORONAVIRUS, NAA: SARS-CoV-2, NAA: NOT DETECTED

## 2019-09-11 ENCOUNTER — Encounter: Payer: Self-pay | Admitting: Family Medicine

## 2019-09-11 MED ORDER — AZITHROMYCIN 250 MG PO TABS: ORAL_TABLET | ORAL | 0 refills | Status: DC

## 2019-09-11 MED ORDER — PREDNISONE 20 MG PO TABS: 20.0000 mg | ORAL_TABLET | Freq: Two times a day (BID) | ORAL | 0 refills | Status: AC

## 2019-09-11 NOTE — Telephone Encounter (Signed)
Pt negative for COVID - likely COPD exacerbation - sending in Azithromycin and Prednisone for her to take.  If not improving by next week, needs to call in for re-evaluation.

## 2019-09-11 NOTE — Telephone Encounter (Signed)
Patient notified

## 2019-09-21 ENCOUNTER — Ambulatory Visit
Admission: RE | Admit: 2019-09-21 | Discharge: 2019-09-21 | Disposition: A | Discharge status: Home / Self Care | Payer: 59 | Attending: Family Medicine | Admitting: Family Medicine

## 2019-09-21 ENCOUNTER — Encounter: Payer: Self-pay | Admitting: Family Medicine

## 2019-09-21 ENCOUNTER — Other Ambulatory Visit: Payer: Self-pay

## 2019-09-21 ENCOUNTER — Other Ambulatory Visit
Admission: RE | Admit: 2019-09-21 | Discharge: 2019-09-21 | Disposition: A | Discharge status: Home / Self Care | Payer: 59 | Source: Home / Self Care | Attending: Family Medicine | Admitting: Family Medicine

## 2019-09-21 ENCOUNTER — Ambulatory Visit (INDEPENDENT_AMBULATORY_CARE_PROVIDER_SITE_OTHER): Payer: 59 | Admitting: Family Medicine

## 2019-09-21 ENCOUNTER — Ambulatory Visit
Admission: RE | Admit: 2019-09-21 | Discharge: 2019-09-21 | Disposition: A | Discharge status: Home / Self Care | Payer: 59 | Source: Ambulatory Visit | Attending: Family Medicine | Admitting: Family Medicine

## 2019-09-21 DIAGNOSIS — J441 Chronic obstructive pulmonary disease with (acute) exacerbation: Secondary | ICD-10-CM

## 2019-09-21 DIAGNOSIS — F17200 Nicotine dependence, unspecified, uncomplicated: Secondary | ICD-10-CM

## 2019-09-21 LAB — COMPREHENSIVE METABOLIC PANEL
ALT: 22 U/L (ref 0–44)
AST: 19 U/L (ref 15–41)
Albumin: 3.7 g/dL (ref 3.5–5.0)
Alkaline Phosphatase: 67 U/L (ref 38–126)
Anion gap: 10 (ref 5–15)
BUN: 15 mg/dL (ref 6–20)
CO2: 23 mmol/L (ref 22–32)
Calcium: 9 mg/dL (ref 8.9–10.3)
Chloride: 108 mmol/L (ref 98–111)
Creatinine, Ser: 0.79 mg/dL (ref 0.44–1.00)
GFR calc Af Amer: >60 mL/min (ref >60)
GFR calc non Af Amer: >60 mL/min (ref >60)
Glucose, Bld: 98 mg/dL (ref 70–99)
Potassium: 3.8 mmol/L (ref 3.5–5.1)
Sodium: 141 mmol/L (ref 135–145)
Total Bilirubin: 0.6 mg/dL (ref 0.3–1.2)
Total Protein: 7.2 g/dL (ref 6.5–8.1)

## 2019-09-21 LAB — CBC WITH DIFFERENTIAL/PLATELET
Abs Immature Granulocytes: 0.04 10*3/uL (ref 0.00–0.07)
Basophils Absolute: 0.1 10*3/uL (ref 0.0–0.1)
Basophils Relative: 1 %
Eosinophils Absolute: 0.2 10*3/uL (ref 0.0–0.5)
Eosinophils Relative: 2 %
HCT: 41.9 % (ref 36.0–46.0)
Hemoglobin: 14.1 g/dL (ref 12.0–15.0)
Immature Granulocytes: 0 %
Lymphocytes Relative: 39 %
Lymphs Abs: 3.9 10*3/uL (ref 0.7–4.0)
MCH: 31.1 pg (ref 26.0–34.0)
MCHC: 33.7 g/dL (ref 30.0–36.0)
MCV: 92.3 fL (ref 80.0–100.0)
Monocytes Absolute: 0.5 10*3/uL (ref 0.1–1.0)
Monocytes Relative: 5 %
Neutro Abs: 5.4 10*3/uL (ref 1.7–7.7)
Neutrophils Relative %: 53 %
Platelets: 317 10*3/uL (ref 150–400)
RBC: 4.54 MIL/uL (ref 3.87–5.11)
RDW: 13.7 % (ref 11.5–15.5)
WBC: 10 10*3/uL (ref 4.0–10.5)
nRBC: 0 % (ref 0.0–0.2)

## 2019-09-21 NOTE — Progress Notes (Signed)
Name: Joan Davis   MRN: [id number]    DOB: Nov 28, 1965   Date:09/21/2019       Progress Note  Subjective  Chief Complaint  Chief Complaint  Patient presents with  . Follow-up    I connected with  Joan Davis on 09/21/19 at  9:20 AM EDT by telephone and verified that I am speaking with the correct person using two identifiers.   I discussed the limitations, risks, security and privacy concerns of performing an evaluation and management service by telephone and the availability of in person appointments. Staff also discussed with the patient that there may be a patient responsible charge related to this service. Patient Location: Home Provider Location: Office Additional Individuals present: None  HPI  Pt presents to follow up; she is concerned that she is still having a small amount of coughing and burning in the chest; states that azithromycin and prednisone did help her significantly. She states her wheezing and shortness of breath have improved.  She is not using symbicort because she did not have a coupon and could not afford - advised she can look this up online, but we will provide one to her as well.  Albuterol 3-4 times a day; she denies any fevers.  She is concerned that she may need alternative antibiotic.  Her COVID19 testing was negative, and she does not want to be retested as she does not believe she has been exposed.  Patient Active Problem List   Diagnosis Date Noted  . H/O arthroscopy of right knee 01/14/2019  . Class 2 severe obesity due to excess calories with serious comorbidity and body mass index (BMI) of 36.0 to 36.9 in adult ([place]) 01/14/2019  . Morbid obesity ([place]) 01/14/2019  . Mood swings 01/14/2019  . Hot flushes, perimenopausal 01/14/2019  . Tobacco use disorder 01/14/2019  . Gastroesophageal reflux disease without esophagitis 01/14/2019  . Mixed hyperlipidemia 07/29/2018  . Vertigo 02/06/2018  . COPD (chronic obstructive pulmonary disease)  ([place]) 12/20/2017  . Hypertension 12/20/2017  . Food impaction of esophagus     Social History   Tobacco Use  . Smoking status: Current Every Day Smoker    Packs/day: 0.50    Years: 31.00    Pack years: 15.50    Types: Cigarettes  . Smokeless tobacco: Never Used  Substance Use Topics  . Alcohol use: No     Current Outpatient Medications:  .  albuterol (VENTOLIN HFA) 108 (90 Base) MCG/ACT inhaler, Inhale 2 puffs into the lungs every 6 (six) hours as needed for wheezing or shortness of breath., Disp: 18 g, Rfl: 2 .  budesonide-formoterol (SYMBICORT) 160-4.5 MCG/ACT inhaler, Inhale 2 puffs into the lungs 2 (two) times daily., Disp: 1 Inhaler, Rfl: 2 .  Cyanocobalamin (VITAMIN B-12 PO), Take by mouth daily., Disp: , Rfl:  .  guaiFENesin (MUCINEX) 600 MG 12 hr tablet, Take 1 tablet (600 mg total) by mouth 2 (two) times daily., Disp: 20 tablet, Rfl: 0 .  hydrochlorothiazide (HYDRODIURIL) 25 MG tablet, Take 1 tablet (25 mg total) by mouth daily., Disp: 90 tablet, Rfl: 1 .  Multiple Vitamin (MULTIVITAMIN) capsule, Take 1 capsule by mouth daily., Disp: , Rfl:  .  VITAMIN D PO, Take by mouth daily., Disp: , Rfl:  .  azithromycin (ZITHROMAX) 250 MG tablet, Day 1: Take 2 tablets; then take 1 tablet once daily (Patient not taking: Reported on 09/21/2019), Disp: 6 tablet, Rfl: 0 .  benzonatate (TESSALON PERLES) 100 MG capsule, Take 1 capsule (  100 mg total) by mouth 3 (three) times daily as needed. (Patient not taking: Reported on 09/21/2019), Disp: 20 capsule, Rfl: 0 .  OMEPRAZOLE PO, Take 1 tablet by mouth 2 (two) times daily., Disp: , Rfl:   No Known Allergies  I personally reviewed active problem list, medication list, allergies, notes from last encounter, lab results with the patient/caregiver today.  ROS  Ten systems reviewed and is negative except as mentioned in HPI  Objective  Virtual encounter, vitals not obtained.  There is no height or weight on file to calculate BMI.   Nursing Note and Vital Signs reviewed.  Physical Exam  Pulmonary/Chest: Effort normal. No respiratory distress. Speaking in complete sentences Neurological: Pt is alert and oriented to person, place, and time. Speech is normal Psychiatric: Patient has a normal mood and affect. behavior is normal. Judgment and thought content normal.  No results found for this or any previous visit (from the past 72 hour(s)).  Assessment & Plan  1. Chronic obstructive pulmonary disease with acute exacerbation (HCC) - CBC with Differential/Platelet; Future - DG Chest 2 View; Future - Comprehensive metabolic panel; Future - [facility] and CXR to better determine if additional steps are needed in her care.  I did advise she needs to fill her Symbicort (with coupon that we will provide) and start on this to help control her COPD a bit better, and that cough can linger for several weeks after a COPD flare.  2. Tobacco use disorder - Recommend cessation. - CBC with Differential/Platelet; Future - DG Chest 2 View; Future - Comprehensive metabolic panel; Future  -Red flags and when to present for emergency care or RTC including fever >101.38F, chest pain, shortness of breath, new/worsening/un-resolving symptoms, reviewed with patient at time of visit. Follow up and care instructions discussed and provided in AVS. - I discussed the assessment and treatment plan with the patient. The patient was provided an opportunity to ask questions and all were answered. The patient agreed with the plan and demonstrated an understanding of the instructions.  - The patient was advised to call back or seek an in-person evaluation if the symptoms worsen or if the condition fails to improve as anticipated.  I provided 16 minutes of non-face-to-face time during this encounter.  Hubbard Hartshorn, FNP

## 2019-09-22 ENCOUNTER — Ambulatory Visit: Payer: 59 | Admitting: Gastroenterology

## 2019-09-28 ENCOUNTER — Ambulatory Visit (INDEPENDENT_AMBULATORY_CARE_PROVIDER_SITE_OTHER): Payer: 59 | Admitting: Family Medicine

## 2019-09-28 ENCOUNTER — Encounter: Payer: Self-pay | Admitting: Family Medicine

## 2019-09-28 ENCOUNTER — Other Ambulatory Visit: Payer: Self-pay

## 2019-09-28 DIAGNOSIS — F17200 Nicotine dependence, unspecified, uncomplicated: Secondary | ICD-10-CM | POA: Diagnosis not present

## 2019-09-28 DIAGNOSIS — J0140 Acute pansinusitis, unspecified: Secondary | ICD-10-CM

## 2019-09-28 DIAGNOSIS — J441 Chronic obstructive pulmonary disease with (acute) exacerbation: Secondary | ICD-10-CM

## 2019-09-28 MED ORDER — DOXYCYCLINE HYCLATE 100 MG PO TABS: 100.0000 mg | ORAL_TABLET | Freq: Two times a day (BID) | ORAL | 0 refills | Status: AC

## 2019-09-28 MED ORDER — FLUTICASONE PROPIONATE 50 MCG/ACT NA SUSP: 2.0000 | Freq: Every day | NASAL | 6 refills | Status: DC

## 2019-09-28 NOTE — Progress Notes (Signed)
Name: Joan Davis   MRN: [id number]    DOB: [birth year]   Date:09/28/2019       Progress Note  Subjective  Chief Complaint  Chief Complaint  Patient presents with  . URI    coughing up green , yellow phelgm with blood    I connected with  Joan Davis  on 09/28/19 at 11:20 AM EST by a video enabled telemedicine application and verified that I am speaking with the correct person using two identifiers.  I discussed the limitations of evaluation and management by telemedicine and the availability of in person appointments. The patient expressed understanding and agreed to proceed. Staff also discussed with the patient that there may be a patient responsible charge related to this service. Patient Location: Home Provider Location: Office Additional Individuals present: None  HPI  Pt presents with concern for ongoing cough - states she has some yellow sputum and a small amount of blood-tinged sputum.  Still having a lot of nasal congestion - thinks her sputum is from post-nasal drip.    She states that she is no longer having as much chest congestion as she was in the past. Was treated with prednisone and aziothromycin on 09/11/2019 - completed these courses.  Mucinex is not helping, has not started her Symbicort inhaler yet.  Denies fevers that she knows of.  - She has stopped smoking since our last visit.  - She did test negative for COVID-19 infection.  - She had negative chest Xray 09/21/2019 aside from COPD.    Patient Active Problem List   Diagnosis Date Noted  . H/O arthroscopy of right knee 01/14/2019  . Class 2 severe obesity due to excess calories with serious comorbidity and body mass index (BMI) of 36.0 to 36.9 in adult ([place]) 01/14/2019  . Morbid obesity ([place]) 01/14/2019  . Mood swings 01/14/2019  . Hot flushes, perimenopausal 01/14/2019  . Tobacco use disorder 01/14/2019  . Gastroesophageal reflux disease without esophagitis 01/14/2019  . Mixed hyperlipidemia  07/29/2018  . Vertigo 02/06/2018  . COPD (chronic obstructive pulmonary disease) ([place]) 12/20/2017  . Hypertension 12/20/2017  . Food impaction of esophagus     Social History   Tobacco Use  . Smoking status: Current Every Day Smoker    Packs/day: 0.50    Years: 31.00    Pack years: 15.50    Types: Cigarettes  . Smokeless tobacco: Never Used  Substance Use Topics  . Alcohol use: No     Current Outpatient Medications:  .  albuterol (VENTOLIN HFA) 108 (90 Base) MCG/ACT inhaler, Inhale 2 puffs into the lungs every 6 (six) hours as needed for wheezing or shortness of breath., Disp: 18 g, Rfl: 2 .  Cyanocobalamin (VITAMIN B-12 PO), Take by mouth daily., Disp: , Rfl:  .  hydrochlorothiazide (HYDRODIURIL) 25 MG tablet, Take 1 tablet (25 mg total) by mouth daily., Disp: 90 tablet, Rfl: 1 .  Multiple Vitamin (MULTIVITAMIN) capsule, Take 1 capsule by mouth daily., Disp: , Rfl:  .  OMEPRAZOLE PO, Take 1 tablet by mouth 2 (two) times daily., Disp: , Rfl:  .  VITAMIN D PO, Take by mouth daily., Disp: , Rfl:  .  budesonide-formoterol (SYMBICORT) 160-4.5 MCG/ACT inhaler, Inhale 2 puffs into the lungs 2 (two) times daily. (Patient not taking: Reported on 09/28/2019), Disp: 1 Inhaler, Rfl: 2  No Known Allergies  I personally reviewed active problem list, medication list, allergies, notes from last encounter, lab results with the patient/caregiver today.  ROS  Ten systems reviewed and is negative except as mentioned in HPI  Objective  Virtual encounter, vitals not obtained.  There is no height or weight on file to calculate BMI.  Nursing Note and Vital Signs reviewed.  Physical Exam  Pulmonary/Chest: Effort normal. No respiratory distress. Speaking in complete sentences.  Cough present intermittently Neurological: Pt is alert and oriented to person, place, and time.  Speech is normal. Psychiatric: Patient has a normal mood and affect. behavior is normal. Judgment and thought content  normal.   No results found for this or any previous visit (from the past 72 hour(s)).  Assessment & Plan  1. Acute non-recurrent pansinusitis - fluticasone (FLONASE) 50 MCG/ACT nasal spray; Place 2 sprays into both nostrils daily.  Dispense: 16 g; Refill: 6 - doxycycline (VIBRA-TABS) 100 MG tablet; Take 1 tablet (100 mg total) by mouth 2 (two) times daily for 7 days.  Dispense: 14 tablet; Refill: 0  2. Chronic obstructive pulmonary disease with acute exacerbation (HCC) - Doxycycline as below; if blood tinged sputum and/or cough persists after 2nd abx + Symbicort, will refer to pulmonology. - fluticasone (FLONASE) 50 MCG/ACT nasal spray; Place 2 sprays into both nostrils daily.  Dispense: 16 g; Refill: 6 - doxycycline (VIBRA-TABS) 100 MG tablet; Take 1 tablet (100 mg total) by mouth 2 (two) times daily for 7 days.  Dispense: 14 tablet; Refill: 0  3. Tobacco use disorder - Encouraged her to continue cessation   -Red flags and when to present for emergency care or RTC including fever >101.63F, chest pain, shortness of breath, new/worsening/un-resolving symptoms, reviewed with patient at time of visit. Follow up and care instructions discussed and provided in AVS. - I discussed the assessment and treatment plan with the patient. The patient was provided an opportunity to ask questions and all were answered. The patient agreed with the plan and demonstrated an understanding of the instructions.  I provided 14 minutes of non-face-to-face time during this encounter.  Hubbard Hartshorn, FNP

## 2019-11-11 ENCOUNTER — Other Ambulatory Visit: Payer: Self-pay

## 2019-11-11 ENCOUNTER — Encounter: Payer: Self-pay | Admitting: Family Medicine

## 2019-11-11 ENCOUNTER — Ambulatory Visit (INDEPENDENT_AMBULATORY_CARE_PROVIDER_SITE_OTHER): Payer: 59 | Admitting: Family Medicine

## 2019-11-11 DIAGNOSIS — H9202 Otalgia, left ear: Secondary | ICD-10-CM

## 2019-11-11 DIAGNOSIS — R0981 Nasal congestion: Secondary | ICD-10-CM

## 2019-11-11 MED ORDER — AZELASTINE HCL 0.1 % NA SOLN: 2.0000 | Freq: Two times a day (BID) | NASAL | 12 refills | Status: DC

## 2019-11-11 MED ORDER — IBUPROFEN 600 MG PO TABS: 600.0000 mg | ORAL_TABLET | Freq: Three times a day (TID) | ORAL | 0 refills | Status: DC | PRN

## 2019-11-11 MED ORDER — GUAIFENESIN ER 600 MG PO TB12: 600.0000 mg | ORAL_TABLET | Freq: Two times a day (BID) | ORAL | 0 refills | Status: DC

## 2019-11-11 NOTE — Progress Notes (Signed)
Name: Joan Davis   MRN: [id number]    DOB: [birth year]   Date:11/11/2019       Progress Note  Subjective  Chief Complaint  Chief Complaint  Patient presents with  . Otalgia    I connected with  Joan Davis on 11/11/19 at  8:00 AM EST by telephone and verified that I am speaking with the correct person using two identifiers.   I discussed the limitations, risks, security and privacy concerns of performing an evaluation and management service by telephone and the availability of in person appointments. Staff also discussed with the patient that there may be a patient responsible charge related to this service. Patient Location: Home Provider Location: Office Additional Individuals present: None  HPI  Pt presents with concern for nasal congestion and left otalgia (shooting pain) for about 2 days.  She reports the left ear is a bit tender to touch, did have some left sided sore throat, mild nagging headache.  She has some post-nasal drainage.  Denies fevers/chills, chest pain, or shortness of breath.  No known contact with COVID-19.  Has been using flonase, claritin, tylenol PRN.    Patient Active Problem List   Diagnosis Date Noted  . H/O arthroscopy of right knee 01/14/2019  . Class 2 severe obesity due to excess calories with serious comorbidity and body mass index (BMI) of 36.0 to 36.9 in adult ([place]) 01/14/2019  . Morbid obesity ([place]) 01/14/2019  . Mood swings 01/14/2019  . Hot flushes, perimenopausal 01/14/2019  . Tobacco use disorder 01/14/2019  . Gastroesophageal reflux disease without esophagitis 01/14/2019  . Mixed hyperlipidemia 07/29/2018  . Vertigo 02/06/2018  . COPD (chronic obstructive pulmonary disease) ([place]) 12/20/2017  . Hypertension 12/20/2017  . Food impaction of esophagus     Social History   Tobacco Use  . Smoking status: Current Every Day Smoker    Packs/day: 0.50    Years: 31.00    Pack years: 15.50    Types: Cigarettes  . Smokeless  tobacco: Never Used  Substance Use Topics  . Alcohol use: No     Current Outpatient Medications:  .  albuterol (VENTOLIN HFA) 108 (90 Base) MCG/ACT inhaler, Inhale 2 puffs into the lungs every 6 (six) hours as needed for wheezing or shortness of breath., Disp: 18 g, Rfl: 2 .  budesonide-formoterol (SYMBICORT) 160-4.5 MCG/ACT inhaler, Inhale 2 puffs into the lungs 2 (two) times daily., Disp: 1 Inhaler, Rfl: 2 .  Cyanocobalamin (VITAMIN B-12 PO), Take by mouth daily., Disp: , Rfl:  .  fluticasone (FLONASE) 50 MCG/ACT nasal spray, Place 2 sprays into both nostrils daily., Disp: 16 g, Rfl: 6 .  hydrochlorothiazide (HYDRODIURIL) 25 MG tablet, Take 1 tablet (25 mg total) by mouth daily., Disp: 90 tablet, Rfl: 1 .  Multiple Vitamin (MULTIVITAMIN) capsule, Take 1 capsule by mouth daily., Disp: , Rfl:  .  OMEPRAZOLE PO, Take 1 tablet by mouth 2 (two) times daily., Disp: , Rfl:  .  VITAMIN D PO, Take by mouth daily., Disp: , Rfl:   No Known Allergies  I personally reviewed active problem list, medication list, allergies, notes from last encounter, lab results with the patient/caregiver today.  ROS  Ten systems reviewed and is negative except as mentioned in HPI  Objective  Virtual encounter, vitals not obtained.  There is no height or weight on file to calculate BMI.  Nursing Note and Vital Signs reviewed.  Physical Exam  Pulmonary/Chest: Effort normal. No respiratory distress. Speaking in complete sentences  Neurological: Pt is alert and oriented to person, place, and time. Coordination, speech and gait are normal.  Psychiatric: Patient has a normal mood and affect. behavior is normal. Judgment and thought content normal.  No results found for this or any previous visit (from the past 72 hour(s)).  Assessment & Plan  1. Left ear pain - Likely eustachian tube dysfunction from nasal congestion; after only 2 days of symptoms, I advised would like to approach conservatively and avoid  antibiotics - she is agreeable to this.  Continue claritin and flonase.  Return in 4-5 days if not improving, sooner if worsening. - azelastine (ASTELIN) 0.1 % nasal spray; Place 2 sprays into both nostrils 2 (two) times daily. Use in each nostril as directed  Dispense: 30 mL; Refill: 12 - guaiFENesin (MUCINEX) 600 MG 12 hr tablet; Take 1 tablet (600 mg total) by mouth 2 (two) times daily.  Dispense: 20 tablet; Refill: 0 - ibuprofen (ADVIL) 600 MG tablet; Take 1 tablet (600 mg total) by mouth every 8 (eight) hours as needed for moderate pain.  Dispense: 20 tablet; Refill: 0  2. Nasal congestion - azelastine (ASTELIN) 0.1 % nasal spray; Place 2 sprays into both nostrils 2 (two) times daily. Use in each nostril as directed  Dispense: 30 mL; Refill: 12 - guaiFENesin (MUCINEX) 600 MG 12 hr tablet; Take 1 tablet (600 mg total) by mouth 2 (two) times daily.  Dispense: 20 tablet; Refill: 0 - ibuprofen (ADVIL) 600 MG tablet; Take 1 tablet (600 mg total) by mouth every 8 (eight) hours as needed for moderate pain.  Dispense: 20 tablet; Refill: 0  -Red flags and when to present for emergency care or RTC including fever >101.89F, chest pain, shortness of breath, new/worsening/un-resolving symptoms, reviewed with patient at time of visit. Follow up and care instructions discussed and provided in AVS. - I discussed the assessment and treatment plan with the patient. The patient was provided an opportunity to ask questions and all were answered. The patient agreed with the plan and demonstrated an understanding of the instructions.  - The patient was advised to call back or seek an in-person evaluation if the symptoms worsen or if the condition fails to improve as anticipated.  I provided 16 minutes of non-face-to-face time during this encounter.  Hubbard Hartshorn, FNP

## 2019-11-16 ENCOUNTER — Ambulatory Visit: Payer: 59 | Admitting: Family Medicine

## 2019-11-30 ENCOUNTER — Encounter: Payer: Self-pay | Admitting: Gastroenterology

## 2019-11-30 ENCOUNTER — Ambulatory Visit: Payer: 59 | Admitting: Gastroenterology

## 2019-11-30 DIAGNOSIS — R1310 Dysphagia, unspecified: Secondary | ICD-10-CM

## 2020-02-24 ENCOUNTER — Telehealth: Payer: Self-pay | Admitting: Emergency Medicine

## 2020-02-24 ENCOUNTER — Other Ambulatory Visit: Payer: Self-pay | Admitting: Family Medicine

## 2020-02-24 DIAGNOSIS — I10 Essential (primary) hypertension: Secondary | ICD-10-CM

## 2020-02-24 MED ORDER — HYDROCHLOROTHIAZIDE 25 MG PO TABS: 25.0000 mg | ORAL_TABLET | Freq: Every day | ORAL | 0 refills | Status: DC

## 2020-02-24 NOTE — Telephone Encounter (Signed)
Would like a 15 day supply of HCTZ called in until appointment on 4/12 to [facility]

## 2020-03-07 ENCOUNTER — Ambulatory Visit: Payer: 59 | Admitting: Internal Medicine

## 2020-03-07 NOTE — Progress Notes (Deleted)
Patient is a 54 year old female patient of Raelyn Ensign who follows up today for a medication refill.  More recently, she has been followed for the following issues: HTN:  - current regimen includes HCTZ 25mg .  - taking medications as instructed, no medication side effects noted, no increased chest pain,SOB, swelling of ankles, no orthostatic lightheadedness, no orthopnea or paroxysmal nocturnal dyspnea, no palpitations - does not follow a low sodium diet;    Hyperlipidemia: Current Medication Regimen: Last Lipids: Lab Results  Component Value Date   CHOL 198 01/14/2019   HDL 56 01/14/2019   LDLCALC 117 (H) 01/14/2019   TRIG 140 01/14/2019   CHOLHDL 3.5 01/14/2019   - Denies: Chest pain, shortness of breath, myalgias. She never started statin therapy that was rx'd . She states will try taking the medication, but also wants to work on lifestyle modifications.  COPD: Taking Symbicort, but does not take it on a regular basis.   Denies shortness of breath, chest pain.   She is still smoking about 12 cigarettes/day. Tried Chantix in the past  Vertigo: Still having bouts of vertigo - happening maybes 1-2 times a week. She is not taking her antivert because she has been out of it due to not following up. No numbness/tingling, confusion, or extremity weakness.  Depressed Mood: Mood swings, irritability, snapping at people recently, and hot flashes.  She had hysterectomy 5 years ago due to fibroid uterus.  She does have 1 ovary still intact.   On effexor to help with symptoms.  Obesity: Wt Readings from Last 3 Encounters:  08/19/19 255 lb 11.7 oz (116 kg)  08/06/19 257 lb (116.6 kg)  07/08/19 260 lb 8 oz (118.2 kg)   Diet: frequent dining out, excessive fat intake and limited physical activity Exercise: moderately active - She is going to [employer] as of last week (just completed PT for knee surgery), she is trying to do cardio, abs, and some weights.  Co-Morbid Conditions:  dyslipidemias and hypertension; 2 or more of these conditions combined with BMI >30 is considered morbid obesity  GERD: Was on omeprazole, and after Raquel Sarna discussed risk of long term use, tapered off of this and onto famotidine and then omeprazole resumed after increasing sx's noted.  In Sept, 2020 had biopsies of the esophagus with increased chest pain noted and were c/w reflux esophagitis and PPI rec'ed to continue. She did see cardiology prior to her EGD procedure who felt CP was not angina, and deferred any ischemic evaluation at that time.  No dysphagia, no blood in stool/dark and tarry stool, no abdominal pain.

## 2020-03-09 ENCOUNTER — Encounter: Payer: Self-pay | Admitting: Family Medicine

## 2020-03-09 ENCOUNTER — Other Ambulatory Visit: Payer: Self-pay

## 2020-03-09 ENCOUNTER — Ambulatory Visit: Payer: 59 | Admitting: Family Medicine

## 2020-03-09 VITALS — BP 128/82 | HR 82 | Temp 96.9°F | Resp 16 | Ht 68.5 in | Wt 257.2 lb

## 2020-03-09 DIAGNOSIS — K219 Gastro-esophageal reflux disease without esophagitis: Secondary | ICD-10-CM

## 2020-03-09 DIAGNOSIS — J410 Simple chronic bronchitis: Secondary | ICD-10-CM

## 2020-03-09 DIAGNOSIS — I10 Essential (primary) hypertension: Secondary | ICD-10-CM

## 2020-03-09 DIAGNOSIS — E782 Mixed hyperlipidemia: Secondary | ICD-10-CM

## 2020-03-09 DIAGNOSIS — Z1231 Encounter for screening mammogram for malignant neoplasm of breast: Secondary | ICD-10-CM

## 2020-03-09 DIAGNOSIS — H9311 Tinnitus, right ear: Secondary | ICD-10-CM

## 2020-03-09 DIAGNOSIS — R002 Palpitations: Secondary | ICD-10-CM

## 2020-03-09 DIAGNOSIS — Z131 Encounter for screening for diabetes mellitus: Secondary | ICD-10-CM

## 2020-03-09 DIAGNOSIS — Z1211 Encounter for screening for malignant neoplasm of colon: Secondary | ICD-10-CM

## 2020-03-09 DIAGNOSIS — R42 Dizziness and giddiness: Secondary | ICD-10-CM

## 2020-03-09 DIAGNOSIS — G8929 Other chronic pain: Secondary | ICD-10-CM

## 2020-03-09 DIAGNOSIS — M25561 Pain in right knee: Secondary | ICD-10-CM

## 2020-03-09 MED ORDER — OMEPRAZOLE 40 MG PO CPDR: 40.0000 mg | DELAYED_RELEASE_CAPSULE | Freq: Every day | ORAL | 1 refills | Status: DC

## 2020-03-09 MED ORDER — HYDROCHLOROTHIAZIDE 25 MG PO TABS: 25.0000 mg | ORAL_TABLET | Freq: Every day | ORAL | 1 refills | Status: DC

## 2020-03-09 NOTE — Addendum Note (Signed)
Addended by: Steele Sizer F on: 03/09/2020 09:48 AM   Modules accepted: Orders

## 2020-03-09 NOTE — Progress Notes (Addendum)
Name: Joan Davis   MRN: [id number]    DOB: 29-Jun-1966   Date:03/09/2020       Progress Note  Subjective  Chief Complaint  Chief Complaint  Patient presents with  . Medication Refill  . Hypertension    Stay dizzy and retains fluid-some of those days she has headaches  . COPD  . Allergic Rhinitis     claritin helps her symptoms  . Hyperlipidemia  . Gastroesophageal Reflux  . Tachycardia    States she has been doing a new diet-detoxing and has noticed her heart races all throughout the day.    HPI   Hypertension: bp is at goal, she is complaint with bp medication, she denies leg cramps, no sob, but has noticed some palpitation and chest fullness that is intermittent, usually present when she wakes up. No diaphoresis or sob with the fullness.   Dyslipidemia: she never started statins, her risk is down, we will continue to monitor  The 10-year ASCVD risk score Mikey Bussing DC Brooke Bonito., et al., 2013) is: 8.1%   Values used to calculate the score:     Age: 54 years     Sex: Female     Is Non-Hispanic African American: Yes     Diabetic: No     Tobacco smoker: Yes     Systolic Blood Pressure: 0000000 mmHg     Is BP treated: Yes     HDL Cholesterol: 56 mg/dL     Total Cholesterol: 198 mg/dL  Morbid obesity: she states she has always been overweight, however after her third pregnancy she started to gain more weight, and her heaviest was 309 lbs a couple of years ago. She states at the time she changed her eating habits - avoiding bread, desserts and also going to the gym daily. She lost down to 268 lbs  She has been drinking more water, eating more fruit and vegetables and avoiding sodas and bread. She started drink to shrink two months ago, her weight was 265 lbs at the time, and is down to 257 lbs  Vertigo: she has a long history of recurrent vertigo. Worse with head movement, associated with nausea but no vomiting. Not associated with headaches. Never seen by ENT. She has intermittent tinnitus  on right side only - she also had some lymphadenopathy left side and ear pain but resolved  COPD: she has been smoking since age 26 , she used to smoke a pack a day, over the past 4 months she is down to at most half pack day. She denies daily cough, no wheezing or sob, uses Symbicort with flares, only one episode in 2020   GERD: she used to take PPI, but stopped a long time , she was seen by Dr. Vicente Males and had EGD done 07/2019 that showed reflux esophagitis, she states at the time she was having constant chest fullness and dysphagia. She took Omeprazole daily for a couple of months and stopped it on her own. She states symptoms are gradually returning over the past week. We will resume PPI and if symptoms don't improve we will consider referral to cardiologist   Dysthymia: she has felt down for a numbers of years intermittently, never severe , does not want medication.   Patient Active Problem List   Diagnosis Date Noted  . H/O arthroscopy of right knee 01/14/2019  . Class 2 severe obesity due to excess calories with serious comorbidity and body mass index (BMI) of 36.0 to 36.9 in adult ([place]) 01/14/2019  .  Morbid obesity ([place]) 01/14/2019  . Mood swings 01/14/2019  . Hot flushes, perimenopausal 01/14/2019  . Tobacco use disorder 01/14/2019  . Gastroesophageal reflux disease without esophagitis 01/14/2019  . Mixed hyperlipidemia 07/29/2018  . Vertigo 02/06/2018  . COPD (chronic obstructive pulmonary disease) ([place]) 12/20/2017  . Hypertension 12/20/2017  . Food impaction of esophagus     Past Surgical History:  Procedure Laterality Date  . CHOLECYSTECTOMY    . CHONDROPLASTY Right 12/09/2018   Procedure: CHONDROPLASTY LATERAL FEMORAL CONDYLE;  Surgeon: Thornton Park, MD;  Location: [facility] ORS;  Service: Orthopedics;  Laterality: Right;  . ESOPHAGOGASTRODUODENOSCOPY (EGD) WITH PROPOFOL N/A 08/06/2019   Procedure: ESOPHAGOGASTRODUODENOSCOPY (EGD) WITH PROPOFOL;  Surgeon: Jonathon Bellows, MD;   Location[facility] ENDOSCOPY;  Service: Gastroenterology;  Laterality: N/A;  . FOREIGN BODY REMOVAL N/A 07/15/2016   Procedure: FOREIGN BODY REMOVAL;  Surgeon: Lucilla Lame, MD;  Location: [facility] ENDOSCOPY;  Service: Endoscopy;  Laterality: N/A;  . KNEE ARTHROSCOPY Right 12/09/2018   Procedure: ARTHROSCOPY KNEE WITH PARTIAL LATERAL MENISCECTOMY;  Surgeon: Thornton Park, MD;  Location: [facility] ORS;  Service: Orthopedics;  Laterality: Right;  . TOTAL ABDOMINAL HYSTERECTOMY      Family History  Problem Relation Age of Onset  . Alcohol abuse Mother   . Heart disease Father 48  . Hypertension Father   . Hypertension Brother   . Hyperlipidemia Brother   . Heart disease Brother 7  . Asthma Maternal Grandmother   . Stroke Maternal Grandfather   . Hypertension Maternal Grandfather   . Congestive Heart Failure Paternal Grandmother   . Heart disease Paternal Grandfather   . Congestive Heart Failure Paternal Grandfather     Social History   Tobacco Use  . Smoking status: Current Every Day Smoker    Packs/day: 0.50    Years: 31.00    Pack years: 15.50    Types: Cigarettes    Start date: 03/09/1988  . Smokeless tobacco: Never Used  Substance Use Topics  . Alcohol use: No     Current Outpatient Medications:  .  albuterol (VENTOLIN HFA) 108 (90 Base) MCG/ACT inhaler, Inhale 2 puffs into the lungs every 6 (six) hours as needed for wheezing or shortness of breath., Disp: 18 g, Rfl: 2 .  budesonide-formoterol (SYMBICORT) 160-4.5 MCG/ACT inhaler, Inhale 2 puffs into the lungs 2 (two) times daily., Disp: 1 Inhaler, Rfl: 2 .  Cyanocobalamin (VITAMIN B-12 PO), Take by mouth daily., Disp: , Rfl:  .  fluticasone (FLONASE) 50 MCG/ACT nasal spray, Place 2 sprays into both nostrils daily., Disp: 16 g, Rfl: 6 .  hydrochlorothiazide (HYDRODIURIL) 25 MG tablet, Take 1 tablet (25 mg total) by mouth daily., Disp: 30 tablet, Rfl: 0 .  loratadine (CLARITIN) 10 MG tablet, Take 10 mg by mouth daily., Disp: , Rfl:  .   Multiple Vitamin (MULTIVITAMIN) capsule, Take 1 capsule by mouth daily., Disp: , Rfl:  .  VITAMIN D PO, Take by mouth daily., Disp: , Rfl:   No Known Allergies  I personally reviewed active problem list, medication list, allergies, family history, social history, health maintenance with the patient/caregiver today.   ROS  Constitutional: Negative for fever , positive for weight change.  Respiratory: Negative for cough and shortness of breath.   Cardiovascular: Negative for chest pain , positive for  palpitations.  Gastrointestinal: Negative for abdominal pain, no bowel changes.  Musculoskeletal: Negative for gait problem or joint swelling.  Skin: Negative for rash.   Neurological: Negative for dizziness or headache.  No other specific complaints in  a complete review of systems (except as listed in HPI above).   Objective  Vitals:   03/09/20 0906  BP: 128/82  Pulse: 82  Resp: 16  Temp: (!) 96.9 F (36.1 C)  TempSrc: Temporal  SpO2: 98%  Weight: 257 lb 3.2 oz (116.7 kg)  Height: 5' 8.5" (1.74 m)    Body mass index is 38.54 kg/m.  Physical Exam  Constitutional: Patient appears well-developed and well-nourished. Obese  No distress.  HEENT: head atraumatic, normocephalic, pupils equal and reactive to light, ears : normal TM, neck supple, oral exam not done Neurological: romberg negative, no nystagmus  Cardiovascular: Normal rate, regular rhythm and normal heart sounds.  No murmur heard. No BLE edema. Pulmonary/Chest: Effort normal and breath sounds normal. No respiratory distress. Abdominal: Soft.  There is no tenderness. Psychiatric: Patient has a normal mood and affect. behavior is normal. Judgment and thought content normal.  PHQ2/9: Depression screen [facility] 2/9 03/09/2020 11/11/2019 09/28/2019 09/21/2019 09/09/2019  Decreased Interest 2 0 0 0 0  Down, Depressed, Hopeless 0 0 0 0 0  PHQ - 2 Score 2 0 0 0 0  Altered sleeping 1 0 0 0 0  Tired, decreased energy 2 0 0 0 0   Change in appetite - 0 0 0 0  Feeling bad or failure about yourself  0 0 0 0 0  Trouble concentrating 1 0 0 0 0  Moving slowly or fidgety/restless 0 0 0 0 0  Suicidal thoughts 0 0 0 0 0  PHQ-9 Score 6 0 0 0 0  Difficult doing work/chores Not difficult at all Not difficult at all Not difficult at all Not difficult at all Not difficult at all  Some recent data might be hidden    phq 9 is negative   Fall Risk: Fall Risk  03/09/2020 11/11/2019 09/28/2019 09/21/2019 09/09/2019  Falls in the past year? 0 0 0 0 0  Number falls in past yr: 0 0 0 0 0  Injury with Fall? 0 0 0 0 0  Risk for fall due to : - - - - -  Follow up - - Falls evaluation completed Falls evaluation completed Falls evaluation completed     Assessment & Plan  1. Essential hypertension  - CBC with Differential/Platelet - COMPLETE METABOLIC PANEL WITH GFR - TSH - hydrochlorothiazide (HYDRODIURIL) 25 MG tablet; Take 1 tablet (25 mg total) by mouth daily.  Dispense: 90 tablet; Refill: 1  2. Chronic bronchitis  ([place])   3. Gastroesophageal reflux disease without esophagitis  - omeprazole (PRILOSEC) 40 MG capsule; Take 1 capsule (40 mg total) by mouth daily.  Dispense: 90 capsule; Refill: 1  4. Morbid obesity ([place])  Discussed with the patient the risk posed by an increased BMI. Discussed importance of portion control, calorie counting and at least 150 minutes of physical activity weekly. Avoid sweet beverages and drink more water. Eat at least 6 servings of fruit and vegetables daily   5. Vertigo  - Ambulatory referral to ENT  6. Mixed hyperlipidemia  - Lipid panel  7. Palpitation  - TSH  8. Diabetes mellitus screening  - Hemoglobin A1c  9. Breast cancer screening by mammogram  - MM 3D SCREEN BREAST BILATERAL; Future  10. Colon cancer screening  - Cologuard  11. Tinnitus, right ear  - Ambulatory referral to ENT   12. Chronic pain of right knee  - Ambulatory referral to Orthopedic Surgery

## 2020-03-10 LAB — CBC WITH DIFFERENTIAL/PLATELET
Absolute Monocytes: 397 cells/uL (ref 200–950)
Basophils Absolute: 33 cells/uL (ref 0–200)
Basophils Relative: 0.5 %
Eosinophils Absolute: 91 cells/uL (ref 15–500)
Eosinophils Relative: 1.4 %
HCT: 42.2 % (ref 35.0–45.0)
Hemoglobin: 14.1 g/dL (ref 11.7–15.5)
Lymphs Abs: 2860 cells/uL (ref 850–3900)
MCH: 31.1 pg (ref 27.0–33.0)
MCHC: 33.4 g/dL (ref 32.0–36.0)
MCV: 93 fL (ref 80.0–100.0)
MPV: 10.1 fL (ref 7.5–12.5)
Monocytes Relative: 6.1 %
Neutro Abs: 3120 cells/uL (ref 1500–7800)
Neutrophils Relative %: 48 %
Platelets: 320 10*3/uL (ref 140–400)
RBC: 4.54 10*6/uL (ref 3.80–5.10)
RDW: 12.3 % (ref 11.0–15.0)
Total Lymphocyte: 44 %
WBC: 6.5 10*3/uL (ref 3.8–10.8)

## 2020-03-10 LAB — COMPLETE METABOLIC PANEL WITH GFR
AG Ratio: 1.6 (calc) (ref 1.0–2.5)
ALT: 18 U/L (ref 6–29)
AST: 18 U/L (ref 10–35)
Albumin: 4.1 g/dL (ref 3.6–5.1)
Alkaline phosphatase (APISO): 59 U/L (ref 37–153)
BUN: 13 mg/dL (ref 7–25)
CO2: 28 mmol/L (ref 20–32)
Calcium: 9.2 mg/dL (ref 8.6–10.4)
Chloride: 107 mmol/L (ref 98–110)
Creat: 0.81 mg/dL (ref 0.50–1.05)
GFR, Est African American: 96 mL/min/{1.73_m2} (ref >=60)
GFR, Est Non African American: 83 mL/min/{1.73_m2} (ref >=60)
Globulin: 2.6 g/dL (calc) (ref 1.9–3.7)
Glucose, Bld: 86 mg/dL (ref 65–99)
Potassium: 4 mmol/L (ref 3.5–5.3)
Sodium: 142 mmol/L (ref 135–146)
Total Bilirubin: 0.5 mg/dL (ref 0.2–1.2)
Total Protein: 6.7 g/dL (ref 6.1–8.1)

## 2020-03-10 LAB — TSH: TSH: 0.63 mIU/L

## 2020-03-10 LAB — LIPID PANEL
Cholesterol: 202 mg/dL — ABNORMAL HIGH (ref <200)
HDL: 44 mg/dL — ABNORMAL LOW (ref >=50)
LDL Cholesterol (Calc): 136 mg/dL (calc) — ABNORMAL HIGH
Non-HDL Cholesterol (Calc): 158 mg/dL (calc) — ABNORMAL HIGH (ref <130)
Total CHOL/HDL Ratio: 4.6 (calc) (ref <5.0)
Triglycerides: 112 mg/dL (ref <150)

## 2020-03-10 LAB — HEMOGLOBIN A1C
Hgb A1c MFr Bld: 5.3 % of total Hgb (ref <5.7)
Mean Plasma Glucose: 105 (calc)
eAG (mmol/L): 5.8 (calc)

## 2020-03-16 ENCOUNTER — Telehealth: Payer: Self-pay | Admitting: Family Medicine

## 2020-03-16 NOTE — Telephone Encounter (Signed)
Copied from [place] [id number]. Topic: General - Other >> Mar 16, 2020  3:19 PM Keene Breath wrote: Reason for CRM: Called to check on a script for Crestor which the pharmacy said the doctor was supposed to send over.  Please advise and call pharmacy to update at [phone]

## 2020-03-18 ENCOUNTER — Telehealth: Payer: Self-pay | Admitting: Family Medicine

## 2020-03-18 ENCOUNTER — Other Ambulatory Visit: Payer: Self-pay

## 2020-03-18 ENCOUNTER — Other Ambulatory Visit: Payer: Self-pay | Admitting: Family Medicine

## 2020-03-18 MED ORDER — ROSUVASTATIN CALCIUM 10 MG PO TABS: 10.0000 mg | ORAL_TABLET | Freq: Every day | ORAL | 1 refills | Status: DC

## 2020-03-18 NOTE — Telephone Encounter (Signed)
Crestor is on pend for Dr.Sowles to send in

## 2020-03-18 NOTE — Telephone Encounter (Signed)
See request for Crestor below. Not on current med list. Last OV on 03/09/20. Per lab note patient was willing to start on Crestor but prescription was not sent in to pharmacy.

## 2020-03-18 NOTE — Telephone Encounter (Signed)
Medication Refill - Medication: crestor  Has the patient contacted their pharmacy? Yes.   (Agent: If no, request that the patient contact the pharmacy for the refill.) (Agent: If yes, when and what did the pharmacy advise?)  Preferred Pharmacy (with phone number or street name):  [place], [place]  [place] [address]  Phone: [phone] Fax: [phone]  Not a 24 hour pharmacy; exact hours not known.     Agent: Please be advised that RX refills may take up to 3 business days. We ask that you follow-up with your pharmacy.

## 2020-03-19 LAB — COLOGUARD
COLOGUARD: NEGATIVE
Cologuard: NEGATIVE

## 2020-03-25 ENCOUNTER — Other Ambulatory Visit (HOSPITAL_COMMUNITY): Payer: Self-pay | Admitting: Otolaryngology

## 2020-03-25 ENCOUNTER — Other Ambulatory Visit: Payer: Self-pay | Admitting: Otolaryngology

## 2020-03-25 DIAGNOSIS — R42 Dizziness and giddiness: Secondary | ICD-10-CM

## 2020-03-25 DIAGNOSIS — H9312 Tinnitus, left ear: Secondary | ICD-10-CM

## 2020-03-31 ENCOUNTER — Ambulatory Visit: Payer: 59 | Admitting: Family Medicine

## 2020-03-31 ENCOUNTER — Other Ambulatory Visit: Payer: Self-pay

## 2020-03-31 ENCOUNTER — Encounter: Payer: Self-pay | Admitting: Family Medicine

## 2020-03-31 VITALS — BP 120/82 | HR 82 | Temp 97.6°F | Resp 14 | Ht 68.0 in | Wt 259.4 lb

## 2020-03-31 DIAGNOSIS — H9312 Tinnitus, left ear: Secondary | ICD-10-CM | POA: Diagnosis not present

## 2020-03-31 DIAGNOSIS — F341 Dysthymic disorder: Secondary | ICD-10-CM | POA: Diagnosis not present

## 2020-03-31 DIAGNOSIS — R42 Dizziness and giddiness: Secondary | ICD-10-CM

## 2020-03-31 DIAGNOSIS — F419 Anxiety disorder, unspecified: Secondary | ICD-10-CM

## 2020-03-31 MED ORDER — MECLIZINE HCL 25 MG PO TABS: 12.5000 mg | ORAL_TABLET | Freq: Two times a day (BID) | ORAL | 0 refills | Status: AC | PRN

## 2020-03-31 NOTE — Progress Notes (Signed)
Name: Joan Davis   MRN: [id number]    DOB: 1966-11-14   Date:03/31/2020       Progress Note  Subjective  Chief Complaint  Chief Complaint  Patient presents with  . Dizziness    onset monday, jittery, confusion    HPI  Dizziness: she described like her head is not moving but the environment is moving, like if she was on a boat. Denies associated nausea, vomiting, tinnitus or hearing loss. She has mild pressure on temporal area She state the dizzy feeling and headaches has been constant for months, but getting progressively worse. She states over the past week it has affected her job, since it is now more intense and also affecting her ability to focus. She states this past Monday she felt off, felt anxious, moody. She mention confusion - but states she was able to go to work and do her job, did not get lost , she felt off but could not specify about why she felt confused She has seen ENT and was given prednisone and had a normal hearing test. She has an MRI scheduled 04/07/2020 Yesterday she had tinnitus on left ear.   She would like to be checked for thyroid, she states she has a family history and would like to be checked. She states hair thinning, irritability, weight changes. Explained her thyroid is normal. She lost her patience with me. She states " I am upset because nobody knows what is going on with me "." It is not in my head".     Patient Active Problem List   Diagnosis Date Noted  . H/O arthroscopy of right knee 01/14/2019  . Class 2 severe obesity due to excess calories with serious comorbidity and body mass index (BMI) of 36.0 to 36.9 in adult ([place]) 01/14/2019  . Morbid obesity ([place]) 01/14/2019  . Mood swings 01/14/2019  . Hot flushes, perimenopausal 01/14/2019  . Tobacco use disorder 01/14/2019  . Gastroesophageal reflux disease without esophagitis 01/14/2019  . Mixed hyperlipidemia 07/29/2018  . Vertigo 02/06/2018  . COPD (chronic obstructive pulmonary disease) ([place])  12/20/2017  . Hypertension 12/20/2017  . Food impaction of esophagus     Past Surgical History:  Procedure Laterality Date  . CHOLECYSTECTOMY    . CHONDROPLASTY Right 12/09/2018   Procedure: CHONDROPLASTY LATERAL FEMORAL CONDYLE;  Surgeon: Thornton Park, MD;  Location: [facility] ORS;  Service: Orthopedics;  Laterality: Right;  . ESOPHAGOGASTRODUODENOSCOPY (EGD) WITH PROPOFOL N/A 08/06/2019   Procedure: ESOPHAGOGASTRODUODENOSCOPY (EGD) WITH PROPOFOL;  Surgeon: Jonathon Bellows, MD;  Location: [facility] ENDOSCOPY;  Service: Gastroenterology;  Laterality: N/A;  . FOREIGN BODY REMOVAL N/A 07/15/2016   Procedure: FOREIGN BODY REMOVAL;  Surgeon: Lucilla Lame, MD;  Location: [facility] ENDOSCOPY;  Service: Endoscopy;  Laterality: N/A;  . KNEE ARTHROSCOPY Right 12/09/2018   Procedure: ARTHROSCOPY KNEE WITH PARTIAL LATERAL MENISCECTOMY;  Surgeon: Thornton Park, MD;  Location: [facility] ORS;  Service: Orthopedics;  Laterality: Right;  . TOTAL ABDOMINAL HYSTERECTOMY      Family History  Problem Relation Age of Onset  . Alcohol abuse Mother   . Heart disease Father 33  . Hypertension Father   . Hypertension Brother   . Hyperlipidemia Brother   . Heart disease Brother 36  . Asthma Maternal Grandmother   . Stroke Maternal Grandfather   . Hypertension Maternal Grandfather   . Congestive Heart Failure Paternal Grandmother   . Heart disease Paternal Grandfather   . Congestive Heart Failure Paternal Grandfather     Social History   Tobacco  Use  . Smoking status: Current Every Day Smoker    Packs/day: 0.50    Years: 31.00    Pack years: 15.50    Types: Cigarettes    Start date: 03/09/1988  . Smokeless tobacco: Never Used  Substance Use Topics  . Alcohol use: No     Current Outpatient Medications:  .  albuterol (VENTOLIN HFA) 108 (90 Base) MCG/ACT inhaler, Inhale 2 puffs into the lungs every 6 (six) hours as needed for wheezing or shortness of breath., Disp: 18 g, Rfl: 2 .  budesonide-formoterol (SYMBICORT)  160-4.5 MCG/ACT inhaler, Inhale 2 puffs into the lungs 2 (two) times daily., Disp: 1 Inhaler, Rfl: 2 .  Cyanocobalamin (VITAMIN B-12 PO), Take by mouth daily., Disp: , Rfl:  .  fluticasone (FLONASE) 50 MCG/ACT nasal spray, Place 2 sprays into both nostrils daily., Disp: 16 g, Rfl: 6 .  hydrochlorothiazide (HYDRODIURIL) 25 MG tablet, Take 1 tablet (25 mg total) by mouth daily., Disp: 90 tablet, Rfl: 1 .  loratadine (CLARITIN) 10 MG tablet, Take 10 mg by mouth daily., Disp: , Rfl:  .  Multiple Vitamin (MULTIVITAMIN) capsule, Take 1 capsule by mouth daily., Disp: , Rfl:  .  omeprazole (PRILOSEC) 40 MG capsule, Take 1 capsule (40 mg total) by mouth daily., Disp: 90 capsule, Rfl: 1 .  rosuvastatin (CRESTOR) 10 MG tablet, Take 1 tablet (10 mg total) by mouth daily., Disp: 90 tablet, Rfl: 1 .  VITAMIN D PO, Take by mouth daily., Disp: , Rfl:   No Known Allergies  I personally reviewed active problem list, medication list, allergies, family history, social history, health maintenance with the patient/caregiver today.   ROS  Constitutional: Negative for fever or weight change.  Respiratory: Negative for cough and shortness of breath.   Cardiovascular: Negative for chest pain or palpitations.  Gastrointestinal: Negative for abdominal pain, no bowel changes.  Musculoskeletal: Negative for gait problem or joint swelling.  Skin: Negative for rash.  Neurological: Positive  for dizziness and headache.  No other specific complaints in a complete review of systems (except as listed in HPI above).  Objective  Vitals:   03/31/20 0854  BP: 120/82  Pulse: 82  Resp: 14  Temp: 97.6 F (36.4 C)  SpO2: 98%  Weight: 259 lb 6.4 oz (117.7 kg)  Height: 5\' 8"  (1.727 m)    Body mass index is 39.44 kg/m.  Physical Exam  Constitutional: Patient appears well-developed and well-nourished. Obese  No distress.  HEENT: head atraumatic, normocephalic, pupils equal and reactive to light, ears normal TM,  neck  supple, throat not examined  Cardiovascular: Normal rate, regular rhythm and normal heart sounds.  No murmur heard. No BLE edema. Pulmonary/Chest: Effort normal and breath sounds normal. No respiratory distress. Abdominal: Soft.  There is no tenderness. Psychiatric: Patient has a normal mood and affect. behavior is normal. Judgment and thought content normal. Neurological : cranial nerves negative, normal strength, normal gait   Recent Results (from the past 2160 hour(s))  Lipid panel     Status: Abnormal   Collection Time: 03/09/20  9:52 AM  Result Value Ref Range   Cholesterol 202 (H) <200 mg/dL   HDL 44 (L) > OR = 50 mg/dL   Triglycerides 112 <150 mg/dL   LDL Cholesterol (Calc) 136 (H) mg/dL (calc)    Comment: Reference range: <100 . Desirable range <100 mg/dL for primary prevention;   <70 mg/dL for patients with CHD or diabetic patients  with > or = 2 CHD risk factors. Marland Kitchen  LDL-C is now calculated using the Martin-Hopkins  calculation, which is a validated novel method providing  better accuracy than the Friedewald equation in the  estimation of LDL-C.  Cresenciano Genre et al. Annamaria Helling. [id number]): 2061-2068  (http://education.[employer].com/faq/FAQ164)    Total CHOL/HDL Ratio 4.6 <5.0 (calc)   Non-HDL Cholesterol (Calc) 158 (H) <130 mg/dL (calc)    Comment: For patients with diabetes plus 1 major ASCVD risk  factor, treating to a non-HDL-C goal of <100 mg/dL  (LDL-C of <70 mg/dL) is considered a therapeutic  option.   CBC with Differential/Platelet     Status: None   Collection Time: 03/09/20  9:52 AM  Result Value Ref Range   WBC 6.5 3.8 - 10.8 Thousand/uL   RBC 4.54 3.80 - 5.10 Million/uL   Hemoglobin 14.1 11.7 - 15.5 g/dL   HCT 42.2 35.0 - 45.0 %   MCV 93.0 80.0 - 100.0 fL   MCH 31.1 27.0 - 33.0 pg   MCHC 33.4 32.0 - 36.0 g/dL   RDW 12.3 11.0 - 15.0 %   Platelets 320 140 - 400 Thousand/uL   MPV 10.1 7.5 - 12.5 fL   Neutro Abs 3,120 1,500 - 7,800 cells/uL   Lymphs  Abs 2,860 850 - 3,900 cells/uL   Absolute Monocytes 397 200 - 950 cells/uL   Eosinophils Absolute 91 15 - 500 cells/uL   Basophils Absolute 33 0 - 200 cells/uL   Neutrophils Relative % 48 %   Total Lymphocyte 44.0 %   Monocytes Relative 6.1 %   Eosinophils Relative 1.4 %   Basophils Relative 0.5 %  COMPLETE METABOLIC PANEL WITH GFR     Status: None   Collection Time: 03/09/20  9:52 AM  Result Value Ref Range   Glucose, Bld 86 65 - 99 mg/dL    Comment: .            Fasting reference interval .    BUN 13 7 - 25 mg/dL   Creat 0.81 0.50 - 1.05 mg/dL    Comment: For patients >21 years of age, the reference limit for Creatinine is approximately 13% higher for people identified as African-American. .    GFR, Est Non African American 83 > OR = 60 mL/min/1.39m2   GFR, Est African American 96 > OR = 60 mL/min/1.66m2   BUN/Creatinine Ratio NOT APPLICABLE 6 - 22 (calc)   Sodium 142 135 - 146 mmol/L   Potassium 4.0 3.5 - 5.3 mmol/L   Chloride 107 98 - 110 mmol/L   CO2 28 20 - 32 mmol/L   Calcium 9.2 8.6 - 10.4 mg/dL   Total Protein 6.7 6.1 - 8.1 g/dL   Albumin 4.1 3.6 - 5.1 g/dL   Globulin 2.6 1.9 - 3.7 g/dL (calc)   AG Ratio 1.6 1.0 - 2.5 (calc)   Total Bilirubin 0.5 0.2 - 1.2 mg/dL   Alkaline phosphatase (APISO) 59 37 - 153 U/L   AST 18 10 - 35 U/L   ALT 18 6 - 29 U/L  Hemoglobin A1c     Status: None   Collection Time: 03/09/20  9:52 AM  Result Value Ref Range   Hgb A1c MFr Bld 5.3 <5.7 % of total Hgb    Comment: For the purpose of screening for the presence of diabetes: . <5.7%       Consistent with the absence of diabetes 5.7-6.4%    Consistent with increased risk for diabetes             (prediabetes) >  or =6.5%  Consistent with diabetes . This assay result is consistent with a decreased risk of diabetes. . Currently, no consensus exists regarding use of hemoglobin A1c for diagnosis of diabetes in children. . According to American Diabetes Association  (ADA) guidelines, hemoglobin A1c <7.0% represents optimal control in non-pregnant diabetic patients. Different metrics may apply to specific patient populations.  Standards of Medical Care in Diabetes(ADA). .    Mean Plasma Glucose 105 (calc)   eAG (mmol/L) 5.8 (calc)  TSH     Status: None   Collection Time: 03/09/20  9:52 AM  Result Value Ref Range   TSH 0.63 mIU/L    Comment:           Reference Range .           > or = 20 Years  0.40-4.50 .                Pregnancy Ranges           First trimester    0.26-2.66           Second trimester   0.55-2.73           Third trimester    0.43-2.91   Cologuard     Status: None   Collection Time: 03/15/20 12:00 AM  Result Value Ref Range   Cologuard Negative Negative      PHQ2/9: Depression screen [facility] 2/9 03/31/2020 03/09/2020 11/11/2019 09/28/2019 09/21/2019  Decreased Interest 3 2 0 0 0  Down, Depressed, Hopeless 1 0 0 0 0  PHQ - 2 Score 4 2 0 0 0  Altered sleeping 1 1 0 0 0  Tired, decreased energy 3 2 0 0 0  Change in appetite 2 0 0 0 0  Feeling bad or failure about yourself  0 0 0 0 0  Trouble concentrating 2 1 0 0 0  Moving slowly or fidgety/restless 0 0 0 0 0  Suicidal thoughts 0 0 0 0 0  PHQ-9 Score 12 6 0 0 0  Difficult doing work/chores Somewhat difficult Not difficult at all Not difficult at all Not difficult at all Not difficult at all  Some recent data might be hidden    phq 9 is positive   Fall Risk: Fall Risk  03/31/2020 03/09/2020 11/11/2019 09/28/2019 09/21/2019  Falls in the past year? 0 0 0 0 0  Number falls in past yr: 0 0 0 0 0  Injury with Fall? 0 0 0 0 0  Risk for fall due to : - - - - -  Follow up - - - Falls evaluation completed Falls evaluation completed     Functional Status Survey: Is the patient deaf or have difficulty hearing?: No Does the patient have difficulty seeing, even when wearing glasses/contacts?: No Does the patient have difficulty concentrating, remembering, or making decisions?:  No Does the patient have difficulty walking or climbing stairs?: No Does the patient have difficulty dressing or bathing?: No Does the patient have difficulty doing errands alone such as visiting a doctor's office or shopping?: No    Assessment & Plan  1. Dizziness  Normal exam, she is getting discouraged, discussed referral to neurologist but she states it is pointless at this time, she needs to work. Cannot take time off, just does not feel good. She would like to be treated for thyroid but explained her labs have been normal. She was not happy   Meclizine refill sent to pharmacy   2. Dysthymia  Explained her dizziness is likely the cause of her decrease in energy and moodiness, she does not want medication at this time  3. Tinnitus, left ear  Happened yesterday, reminded her of MRI and follow up with ENT  4. Anxiety  Likely the cause of her jittery feeling

## 2020-04-07 ENCOUNTER — Other Ambulatory Visit: Payer: Self-pay

## 2020-04-07 ENCOUNTER — Ambulatory Visit
Admission: RE | Admit: 2020-04-07 | Discharge: 2020-04-07 | Disposition: A | Discharge status: Home / Self Care | Payer: 59 | Source: Ambulatory Visit | Attending: Otolaryngology | Admitting: Otolaryngology

## 2020-04-07 DIAGNOSIS — R42 Dizziness and giddiness: Secondary | ICD-10-CM

## 2020-04-07 DIAGNOSIS — H9312 Tinnitus, left ear: Secondary | ICD-10-CM | POA: Insufficient documentation

## 2020-04-07 MED ORDER — GADOBUTROL 1 MMOL/ML IV SOLN
10.0000 mL | Freq: Once | INTRAVENOUS | Status: AC | PRN
  Administered 2020-04-07: 10 mL via INTRAVENOUS

## 2020-05-02 ENCOUNTER — Other Ambulatory Visit: Payer: Self-pay | Admitting: Family Medicine

## 2020-05-02 ENCOUNTER — Telehealth: Payer: Self-pay | Admitting: Emergency Medicine

## 2020-05-02 DIAGNOSIS — I10 Essential (primary) hypertension: Secondary | ICD-10-CM

## 2020-05-02 MED ORDER — TRIAMTERENE-HCTZ 37.5-25 MG PO TABS: 1.0000 | ORAL_TABLET | Freq: Every day | ORAL | 0 refills | Status: DC

## 2020-05-02 NOTE — Telephone Encounter (Signed)
Copied from [place] [id number]. Topic: General - Inquiry >> Apr 29, 2020 11:14 AM Scherrie Gerlach wrote: Reason for CRM:  [place] with Dr Lula Olszewski wants to know if you received a fax yesterday for clearance to change pt from HCTZ to dyazide  Cb [phone]  ext [phone] She would like to know something today please

## 2020-05-03 NOTE — Telephone Encounter (Signed)
Patient notified

## 2020-05-11 ENCOUNTER — Ambulatory Visit (INDEPENDENT_AMBULATORY_CARE_PROVIDER_SITE_OTHER): Payer: 59 | Admitting: Internal Medicine

## 2020-05-11 ENCOUNTER — Encounter: Payer: Self-pay | Admitting: Internal Medicine

## 2020-05-11 ENCOUNTER — Ambulatory Visit (INDEPENDENT_AMBULATORY_CARE_PROVIDER_SITE_OTHER): Payer: 59

## 2020-05-11 ENCOUNTER — Other Ambulatory Visit: Payer: Self-pay

## 2020-05-11 VITALS — BP 111/72 | HR 74 | Ht 68.5 in | Wt 269.4 lb

## 2020-05-11 DIAGNOSIS — R079 Chest pain, unspecified: Secondary | ICD-10-CM | POA: Diagnosis not present

## 2020-05-11 DIAGNOSIS — R002 Palpitations: Secondary | ICD-10-CM | POA: Diagnosis not present

## 2020-05-11 DIAGNOSIS — I10 Essential (primary) hypertension: Secondary | ICD-10-CM

## 2020-05-11 DIAGNOSIS — R42 Dizziness and giddiness: Secondary | ICD-10-CM

## 2020-05-11 NOTE — Patient Instructions (Signed)
Medication Instructions:  Your physician recommends that you continue on your current medications as directed. Please refer to the Current Medication list given to you today.  *If you need a refill on your cardiac medications before your next appointment, please call your pharmacy*   Lab Work: none If you have labs (blood work) drawn today and your tests are completely normal, you will receive your results only by: Marland Kitchen MyChart Message (if you have MyChart) OR . A paper copy in the mail If you have any lab test that is abnormal or we need to change your treatment, we will call you to review the results.   Testing/Procedures: 1- ECHOCARDIOGRAM - Your physician has requested that you have an echocardiogram. Echocardiography is a painless test that uses sound waves to create images of your heart. It provides your doctor with information about the size and shape of your heart and how well your heart's chambers and valves are working. This procedure takes approximately one hour. There are no restrictions for this procedure. You may get an IV, if needed, to receive an ultrasound enhancing agent through to better visualize your heart.    2- CAROTID DOPPLERS - Your physician has requested that you have a carotid duplex. This test is an ultrasound of the carotid arteries in your neck. It looks at blood flow through these arteries that supply the brain with blood. Allow one hour for this exam. There are no restrictions or special instructions.   3- ZIO XT MONITOR FOR 14 DAYS Your physician has recommended that you wear a Zio monitor. This monitor is a medical device that records the heart's electrical activity. Doctors most often use these monitors to diagnose arrhythmias. Arrhythmias are problems with the speed or rhythm of the heartbeat. The monitor is a small device applied to your chest. You can wear one while you do your normal daily activities. While wearing this monitor if you have any symptoms to  push the button and record what you felt. Once you have worn this monitor for the period of time provider prescribed (Usually 14 days), you will return the monitor device in the postage paid box. Once it is returned they will download the data collected and provide [place] with a report which the provider will then review and we will call you with those results. Important tips:  1. Avoid showering during the first 24 hours of wearing the monitor. 2. Avoid excessive sweating to help maximize wear time. 3. Do not submerge the device, no hot tubs, and no swimming pools. 4. Keep any lotions or oils away from the patch. 5. After 24 hours you may shower with the patch on. Take brief showers with your back facing the shower head.  6. Do not remove patch once it has been placed because that will interrupt data and decrease adhesive wear time. 7. Push the button when you have any symptoms and write down what you were feeling. 8. Once you have completed wearing your monitor, remove and place into box which has postage paid and place in your outgoing mailbox.  9. If for some reason you have misplaced your box then call our office and we can provide another box and/or mail it off for you.   Follow-Up: At [facility], you and your health needs are our priority.  As part of our continuing mission to provide you with exceptional heart care, we have created designated Provider Care Teams.  These Care Teams include your primary Cardiologist (physician) and Advanced  Practice Providers (APPs -  Physician Assistants and Nurse Practitioners) who all work together to provide you with the care you need, when you need it.  We recommend signing up for the patient portal called "MyChart".  Sign up information is provided on this After Visit Summary.  MyChart is used to connect with patients for Virtual Visits (Telemedicine).  Patients are able to view lab/test results, encounter notes, upcoming appointments, etc.  Non-urgent  messages can be sent to your provider as well.   To learn more about what you can do with MyChart, go to [email or web address].    Your next appointment:   2 month(s)  The format for your next appointment:   In Person  Provider:    You may see DR Harrell Gave END or one of the following Advanced Practice Providers on your designated Care Team:    Murray Hodgkins, NP  Christell Faith, PA-C  Marrianne Mood, PA-C

## 2020-05-11 NOTE — Progress Notes (Signed)
Follow-up Outpatient Visit Date: 05/11/2020  Primary Care Provider: Hubbard Hartshorn, [place] [address]  Chief Complaint: Dizziness  HPI:  Joan Davis is a 54 y.o. female with history of hypertension, GERD, COPD, and vertigo, who presents for follow-up of chest pain.  I met her in 06/2019 for evaluation of chest pain and preoperative cardiovascular risk assessment in anticipation of EGD.  She reported 2 distinct types of chest pain at that time, including heartburn as well as some tenderness along the inferior margin of her sternum.  Pain was felt to be noncardiac in nature, as [name] was able to complete greater than 4 METS of activity without angina or dyspnea.  No further cardiac work-up was recommended.  Today, [name] is most concerned about dizziness that has been present on a daily basis for the last 1.5 months.  She feels off balance, as if her eyes are spinning in her head.  She has undergone extensive ENT evaluation without clear etiology identified.  She feels like the dizziness happens when she turns her head from side to side.  She does not have orthostatic lightheadedness.  She has not passed out.  Some days, the dizziness is mild and she is able to perform her usual activities.  Some days, it is incapacitating and forces her to miss work.  She reports palpitations with a feeling of having had a racing heart several months ago, though this is not clearly associated with her aforementioned dizziness.  [name] continues to have constant chest pain, similar to what she experienced at our last evaluation.  She states that EGD did not show any abnormalities to explain her chest pain.  She has been using omeprazole without improvement.  The chest pain does not seem to be worsened by eating or exertion.  She denies shortness of breath but notes occasional dependent  edema.  --------------------------------------------------------------------------------------------------  Cardiovascular History & Procedures: Cardiovascular Problems:  Chest pain  Risk Factors:  Hypertension, tobacco use, and family history  Cath/PCI:  None  CV Surgery:  None  EP Procedures and Devices:  None  Non-Invasive Evaluation(s):  None  Recent CV Pertinent Labs: Lab Results  Component Value Date   CHOL 202 (H) 03/09/2020   HDL 44 (L) 03/09/2020   LDLCALC 136 (H) 03/09/2020   TRIG 112 03/09/2020   CHOLHDL 4.6 03/09/2020   INR 0.94 12/08/2018   K 4.0 03/09/2020   K 3.5 07/25/2013   BUN 13 03/09/2020   BUN 17 07/25/2013   CREATININE 0.81 03/09/2020    Past medical and surgical history were reviewed and updated in EPIC.  Current Meds  Medication Sig  . albuterol (VENTOLIN HFA) 108 (90 Base) MCG/ACT inhaler Inhale 2 puffs into the lungs every 6 (six) hours as needed for wheezing or shortness of breath.  . budesonide-formoterol (SYMBICORT) 160-4.5 MCG/ACT inhaler Inhale 2 puffs into the lungs 2 (two) times daily.  . Cyanocobalamin (VITAMIN B-12 PO) Take by mouth daily.  . fluticasone (FLONASE) 50 MCG/ACT nasal spray Place 2 sprays into both nostrils daily.  Marland Kitchen loratadine (CLARITIN) 10 MG tablet Take 10 mg by mouth daily.  . meclizine (ANTIVERT) 25 MG tablet Take 0.5-1 tablets (12.5-25 mg total) by mouth 2 (two) times daily as needed for dizziness.  . Multiple Vitamin (MULTIVITAMIN) capsule Take 1 capsule by mouth daily.  Marland Kitchen omeprazole (PRILOSEC) 40 MG capsule Take 1 capsule (40 mg total) by mouth daily.  . rosuvastatin (CRESTOR) 10 MG tablet  Take 1 tablet (10 mg total) by mouth daily.  Marland Kitchen triamterene-hydrochlorothiazide (MAXZIDE-25) 37.5-25 MG tablet Take 1 tablet by mouth daily.  Marland Kitchen VITAMIN D PO Take by mouth daily.    Allergies: Patient has no known allergies.  Social History   Tobacco Use  . Smoking status: Current Every Day Smoker     Packs/day: 0.50    Years: 31.00    Pack years: 15.50    Types: Cigarettes    Start date: 03/09/1988  . Smokeless tobacco: Never Used  Vaping Use  . Vaping Use: Never used  Substance Use Topics  . Alcohol use: No  . Drug use: No    Family History  Problem Relation Age of Onset  . Alcohol abuse Mother   . Heart disease Father 13  . Hypertension Father   . Hypertension Brother   . Hyperlipidemia Brother   . Heart disease Brother 73  . Asthma Maternal Grandmother   . Stroke Maternal Grandfather   . Hypertension Maternal Grandfather   . Congestive Heart Failure Paternal Grandmother   . Heart disease Paternal Grandfather   . Congestive Heart Failure Paternal Grandfather     Review of Systems: A 12-system review of systems was performed and was negative except as noted in the HPI.  --------------------------------------------------------------------------------------------------  Physical Exam: BP 111/72 (BP Location: Left Arm, Patient Position: Sitting, Cuff Size: Large)   Pulse 74   Ht 5' 8.5" (1.74 m)   Wt 269 lb 6 oz (122.2 kg)   SpO2 98%   BMI 40.36 kg/m   General: NAD. HEENT: No conjunctival pallor or scleral icterus. Facemask in place. Neck: No JVD or HJR. Lungs: Normal work of breathing. Clear to auscultation bilaterally without wheezes or crackles. Heart: Regular rate and rhythm without murmurs, rubs, or gallops.  Abd: Bowel sounds present. Soft, NT/ND without. Ext: No lower extremity edema. Radial, PT, and DP pulses are 2+ bilaterally.  EKG: Normal sinus rhythm without abnormality.  Lab Results  Component Value Date   WBC 6.5 03/09/2020   HGB 14.1 03/09/2020   HCT 42.2 03/09/2020   MCV 93.0 03/09/2020   PLT 320 03/09/2020    Lab Results  Component Value Date   NA 142 03/09/2020   K 4.0 03/09/2020   CL 107 03/09/2020   CO2 28 03/09/2020   BUN 13 03/09/2020   CREATININE 0.81 03/09/2020   GLUCOSE 86 03/09/2020   ALT 18 03/09/2020    Lab Results   Component Value Date   CHOL 202 (H) 03/09/2020   HDL 44 (L) 03/09/2020   LDLCALC 136 (H) 03/09/2020   TRIG 112 03/09/2020   CHOLHDL 4.6 03/09/2020    --------------------------------------------------------------------------------------------------  ASSESSMENT AND PLAN: Dizziness and palpitations: Dizziness is mostly positional with head turning without orthostatic component.  This would be unusual for primary cardiac etiology.  However, given report of palpitations over the last few months, transient arrhythmia cannot be entirely excluded.  Examination and EKG today are unremarkable.  I recommended that we obtain an echocardiogram and carotid Dopplers as well as a 14-day event monitor for further evaluation.  I have encouraged Ms. Filler to follow through with neurology consultation, as recommended by her other providers.  Chest pain: Longstanding and atypical, as it is constant without exertional component.  We have agreed to begin with a transthoracic echocardiogram.  If symptoms persist and echocardiogram is unrevealing, I would favor proceeding with cardiac CTA.  Hypertension: Blood pressure well controlled today.  No medication changes at this time.  Morbid obesity: BMI greater than forty.  Recommend weight loss through diet and exercise.  Follow-up: Return to clinic in 2 months.  Nelva Bush, MD 05/11/2020 3:50 PM

## 2020-05-13 ENCOUNTER — Encounter: Payer: Self-pay | Admitting: Internal Medicine

## 2020-05-13 DIAGNOSIS — R002 Palpitations: Secondary | ICD-10-CM | POA: Insufficient documentation

## 2020-05-13 DIAGNOSIS — R079 Chest pain, unspecified: Secondary | ICD-10-CM | POA: Insufficient documentation

## 2020-06-17 ENCOUNTER — Other Ambulatory Visit: Payer: 59

## 2020-06-27 ENCOUNTER — Telehealth: Payer: Self-pay | Admitting: *Deleted

## 2020-06-27 NOTE — Telephone Encounter (Signed)
Results called to pt. Pt verbalized understanding.  

## 2020-06-27 NOTE — Telephone Encounter (Signed)
-----   Message from Nelva Bush, MD sent at 06/27/2020  3:00 PM EDT ----- Please let Ms. Monceaux know that her event monitor shows occasional extra beats, though I do not suspect that they are causing her recurrent dizziness.  She should continue her current medications; we will follow-up after completion of the echocardiogram and carotid Dopplers.

## 2020-06-27 NOTE — Telephone Encounter (Signed)
No answer. Left message to call back.   

## 2020-07-11 ENCOUNTER — Ambulatory Visit: Payer: 59 | Admitting: Family

## 2020-07-18 ENCOUNTER — Ambulatory Visit (INDEPENDENT_AMBULATORY_CARE_PROVIDER_SITE_OTHER): Payer: 59

## 2020-07-18 ENCOUNTER — Other Ambulatory Visit: Payer: Self-pay

## 2020-07-18 DIAGNOSIS — R079 Chest pain, unspecified: Secondary | ICD-10-CM | POA: Diagnosis not present

## 2020-07-18 DIAGNOSIS — R42 Dizziness and giddiness: Secondary | ICD-10-CM | POA: Diagnosis not present

## 2020-07-18 LAB — ECHOCARDIOGRAM COMPLETE
Area-P 1/2: 1.6 cm2
S' Lateral: 3.9 cm

## 2020-07-21 ENCOUNTER — Ambulatory Visit: Payer: 59 | Admitting: Family

## 2020-07-27 ENCOUNTER — Ambulatory Visit: Payer: 59 | Admitting: Family

## 2020-07-27 NOTE — Progress Notes (Deleted)
Office Visit    Patient Name: Joan Davis Date of Encounter: 07/27/2020  Primary Care Provider:  Hubbard Hartshorn, FNP Primary Cardiologist:  Nelva Bush, MD Electrophysiologist:  None   Chief Complaint    Joan Davis is a 54 y.o. female with a hx of HTN, GERD, COPD, vertigo, dizziness, chest pain presents today for follow up after cardiac testing   Past Medical History    Past Medical History:  Diagnosis Date  . COPD (chronic obstructive pulmonary disease) ([place])   . GERD (gastroesophageal reflux disease)   . Hyperlipidemia   . Hypertension   . Vertigo    Past Surgical History:  Procedure Laterality Date  . CHOLECYSTECTOMY    . CHONDROPLASTY Right 12/09/2018   Procedure: CHONDROPLASTY LATERAL FEMORAL CONDYLE;  Surgeon: Thornton Park, MD;  Location: [facility] ORS;  Service: Orthopedics;  Laterality: Right;  . ESOPHAGOGASTRODUODENOSCOPY (EGD) WITH PROPOFOL N/A 08/06/2019   Procedure: ESOPHAGOGASTRODUODENOSCOPY (EGD) WITH PROPOFOL;  Surgeon: Jonathon Bellows, MD;  Location: [facility] ENDOSCOPY;  Service: Gastroenterology;  Laterality: N/A;  . FOREIGN BODY REMOVAL N/A 07/15/2016   Procedure: FOREIGN BODY REMOVAL;  Surgeon: Lucilla Lame, MD;  Location: [facility] ENDOSCOPY;  Service: Endoscopy;  Laterality: N/A;  . KNEE ARTHROSCOPY Right 12/09/2018   Procedure: ARTHROSCOPY KNEE WITH PARTIAL LATERAL MENISCECTOMY;  Surgeon: Thornton Park, MD;  Location: [facility] ORS;  Service: Orthopedics;  Laterality: Right;  . TOTAL ABDOMINAL HYSTERECTOMY      Allergies  No Known Allergies  History of Present Illness    Joan Davis is a 54 y.o. female with a hx of  HTN, GERD, COPD, vertigo, dizziness, chest pain.  She was last seen 6/16/by Dr. Saunders Revel.  Establish with cardiology 06/2019 due to chest pain and preoperative cardiovascular risk assessment in setting EGD.  Pain was felt to be noncardiac in nature as it was heartburn and tenderness along inferior margin of sternum.  She had exercise  tolerance of greater than 4 METS.  When seen in clinic 6-16/21 her chief complaint was dizziness.  She has undergone ENT evaluation without clear etiology.  She notices dizziness when turning head side to side.  No orthostatic lightheadedness.  She also noted continued chest discomfort with no etiology identified by EGD and no improvement on omeprazole.  No shortness of breath.  Section cardiac testing includes ZIO monitor 05/11/20 with predominantly normal sinus rhythm, rare PAC/PVC, single 4 beat run of VT, 9 atrial runs up to 13 beats with max rate of 193 bpm.  Her triggered events corresponded to sinus rhythm with PAC/PVC.  Echo 07/17/2020 with LVEF 55 to 70%, no RWMA, LV mildly dilated, normal diastolic parameters, no significant valvular abnormalities.  Carotid duplex 07/18/2020 with no evidence of stenosis bilaterally.  ***  EKGs/Labs/Other Studies Reviewed:   The following studies were reviewed today:  Carotid duplex 07/19/2020 Summary:  Right Carotid: The extracranial vessels were near-normal with only minimal  wall  thickening or plaque.   Left Carotid: The extracranial vessels were near-normal with only minimal  wall thickening or plaque.   Echo 07/18/2020  1. Left ventricular ejection fraction, by estimation, is 55 to 60%. The  left ventricle has normal function. The left ventricle has no regional  wall motion abnormalities. The left ventricular internal cavity size was  mildly dilated. Left ventricular  diastolic parameters were normal.   2. Right ventricular systolic function is normal. The right ventricular  size is normal. Tricuspid regurgitation signal is inadequate for assessing  PA pressure.  3. The mitral valve is normal in structure. Trivial mitral valve  regurgitation. No evidence of mitral stenosis.   4. The aortic valve is normal in structure. Aortic valve regurgitation is  not visualized. No aortic stenosis is present.   5. The inferior vena cava is normal in  size with greater than 50%  respiratory variability, suggesting right atrial pressure of 3 mmHg.   Long-term monitor 06/27/2020  The patient was monitored for 10 days, 23 hours.  The predominant rhythm was sinus with an average rate of 85 bpm (range 54-157 bpm in sinus).  There were rare PAC's and occasional PVC's.  A single 4-beat run of ventricular tachycardia occurred with a maximum rate of 146 bpm.  There were 9 atrial runs lasting up to 13 beats with a maximum rate of 193 bpm.  No sustained arrhythmia or prolonged pause was observed.  Patient triggered event corresponds to sinus rhythm with PAC's and PVC's.   Predominantly sinus rhythm with rare PAC's and occasional PVC's.  Brief runs of PSVT and NSVT also noted.   EKG:  EKG is ordered today.  The ekg ordered today demonstrates ***  Recent Labs: 03/09/2020: ALT 18; BUN 13; Creat 0.81; Hemoglobin 14.1; Platelets 320; Potassium 4.0; Sodium 142; TSH 0.63  Recent Lipid Panel    Component Value Date/Time   CHOL 202 (H) 03/09/2020 0952   TRIG 112 03/09/2020 0952   HDL 44 (L) 03/09/2020 0952   CHOLHDL 4.6 03/09/2020 0952   LDLCALC 136 (H) 03/09/2020 0952    Home Medications   No outpatient medications have been marked as taking for the 07/27/20 encounter (Appointment) with Loel Dubonnet, NP.      Review of Systems    ***   ROS All other systems reviewed and are otherwise negative except as noted above.  Physical Exam    VS:  There were no vitals taken for this visit. , BMI There is no height or weight on file to calculate BMI. GEN: Well nourished, well developed, in no acute distress. HEENT: normal. Neck: Supple, no JVD, carotid bruits, or masses. Cardiac: ***RRR, no murmurs, rubs, or gallops. No clubbing, cyanosis, edema.  ***Radials/DP/PT 2+ and equal bilaterally.  Respiratory:  ***Respirations regular and unlabored, clear to auscultation bilaterally. GI: Soft, nontender, nondistended, BS + x 4. MS: No  deformity or atrophy. Skin: Warm and dry, no rash. Neuro:  Strength and sensation are intact. Psych: Normal affect.  Assessment & Plan    1. Dizziness and palpitations -  2. Chest pain - 3. HTN- 4. Morbid obesity -   Disposition: Follow up {follow up:15908} with Dr. Saunders Revel or APP   Loel Dubonnet, NP 07/27/2020, 1:12 PM

## 2020-07-28 ENCOUNTER — Encounter: Payer: Self-pay | Admitting: Family

## 2020-07-28 ENCOUNTER — Other Ambulatory Visit: Payer: Self-pay | Admitting: Family Medicine

## 2020-07-28 DIAGNOSIS — R9389 Abnormal findings on diagnostic imaging of other specified body structures: Secondary | ICD-10-CM

## 2020-08-04 ENCOUNTER — Other Ambulatory Visit: Payer: Self-pay

## 2020-08-04 ENCOUNTER — Ambulatory Visit
Admission: RE | Admit: 2020-08-04 | Discharge: 2020-08-04 | Disposition: A | Discharge status: Home / Self Care | Payer: 59 | Source: Ambulatory Visit | Attending: Family Medicine | Admitting: Family Medicine

## 2020-08-04 DIAGNOSIS — R9389 Abnormal findings on diagnostic imaging of other specified body structures: Secondary | ICD-10-CM | POA: Insufficient documentation

## 2020-08-05 ENCOUNTER — Other Ambulatory Visit: Payer: Self-pay | Admitting: Family Medicine

## 2020-08-05 DIAGNOSIS — E042 Nontoxic multinodular goiter: Secondary | ICD-10-CM

## 2020-08-08 ENCOUNTER — Other Ambulatory Visit: Payer: Self-pay

## 2020-08-08 DIAGNOSIS — E042 Nontoxic multinodular goiter: Secondary | ICD-10-CM

## 2020-08-09 ENCOUNTER — Other Ambulatory Visit: Payer: Self-pay

## 2020-08-10 ENCOUNTER — Ambulatory Visit: Payer: Commercial Managed Care - HMO

## 2020-08-12 NOTE — Progress Notes (Signed)
Please call patient and ask her what location she wants to be seen at. Does she want to be local or not.

## 2020-08-12 NOTE — Progress Notes (Signed)
Pt stated that an appointment has already been scheduled for the one in [place], but she would like to have it switched to somewhere in [facility]

## 2020-08-12 NOTE — Progress Notes (Signed)
Pt stated that an appointment has already been scheduled for the one in [place], but she would like to have it switched to somewhere in [facility]

## 2020-09-13 ENCOUNTER — Encounter: Payer: 59 | Admitting: Family Medicine

## 2020-09-13 DIAGNOSIS — Z1159 Encounter for screening for other viral diseases: Secondary | ICD-10-CM

## 2020-09-13 DIAGNOSIS — E042 Nontoxic multinodular goiter: Secondary | ICD-10-CM | POA: Insufficient documentation

## 2020-09-15 ENCOUNTER — Ambulatory Visit: Payer: 59

## 2020-09-16 ENCOUNTER — Ambulatory Visit: Payer: 59

## 2020-09-19 ENCOUNTER — Other Ambulatory Visit: Payer: Self-pay

## 2020-09-19 DIAGNOSIS — J441 Chronic obstructive pulmonary disease with (acute) exacerbation: Secondary | ICD-10-CM

## 2020-09-20 ENCOUNTER — Other Ambulatory Visit: Payer: Self-pay

## 2020-09-20 DIAGNOSIS — J441 Chronic obstructive pulmonary disease with (acute) exacerbation: Secondary | ICD-10-CM

## 2020-09-20 NOTE — Telephone Encounter (Signed)
Pt informed and declined appt at this time

## 2020-09-23 ENCOUNTER — Other Ambulatory Visit: Payer: Self-pay

## 2020-10-04 ENCOUNTER — Ambulatory Visit: Payer: 59 | Admitting: Endocrinology

## 2020-11-07 ENCOUNTER — Other Ambulatory Visit: Payer: Self-pay

## 2020-12-06 NOTE — Progress Notes (Signed)
Name: Joan Davis   MRN: [id number]    DOB: 08/19/1966   Date:12/07/2020       Progress Note  Subjective  Chief Complaint  Chief Complaint  Patient presents with  . bodyaches  . Cough    Yesterday  . Nasal Congestion    I connected with  [name]  on 12/07/20 at  3:40 PM EST by a video enabled telemedicine application and verified that I am speaking with the correct person using two identifiers.  I discussed the limitations of evaluation and management by telemedicine and the availability of in person appointments. The patient expressed understanding and agreed to proceed with the virtual visit  Staff also discussed with the patient that there may be a patient responsible charge related to this service. Patient Location: at home  Provider Location: [facility] Additional Individuals present: alone   HPI  Possible COVID-19 : she works as Chief Strategy Officer and was at work yesterday and patient coughed on her , woke this morning with rhinorrhea, nasal congestion, headache, mild productive  cough that is yellow in color,  fatigue and body aches. She has not checked her temperature but has chills. She had two doses of COVID-19 Pfizer but not the booster or flu vaccine.    Patient Active Problem List   Diagnosis Date Noted  . Multinodular goiter 09/13/2020  . Palpitations 05/13/2020  . Chest pain of uncertain etiology 72/53/6644  . H/O arthroscopy of right knee 01/14/2019  . Class 2 severe obesity due to excess calories with serious comorbidity and body mass index (BMI) of 36.0 to 36.9 in adult ([place]) 01/14/2019  . Morbid obesity ([place]) 01/14/2019  . Mood swings 01/14/2019  . Hot flushes, perimenopausal 01/14/2019  . Tobacco use disorder 01/14/2019  . Gastroesophageal reflux disease without esophagitis 01/14/2019  . Mixed hyperlipidemia 07/29/2018  . Dizziness 02/06/2018  . COPD (chronic obstructive pulmonary disease) ([place]) 12/20/2017  . Hypertension 12/20/2017  . Food  impaction of esophagus     Past Surgical History:  Procedure Laterality Date  . CHOLECYSTECTOMY    . CHONDROPLASTY Right 12/09/2018   Procedure: CHONDROPLASTY LATERAL FEMORAL CONDYLE;  Surgeon: Thornton Park, MD;  Location: [facility] ORS;  Service: Orthopedics;  Laterality: Right;  . ESOPHAGOGASTRODUODENOSCOPY (EGD) WITH PROPOFOL N/A 08/06/2019   Procedure: ESOPHAGOGASTRODUODENOSCOPY (EGD) WITH PROPOFOL;  Surgeon: Jonathon Bellows, MD;  Location: [facility] ENDOSCOPY;  Service: Gastroenterology;  Laterality: N/A;  . FOREIGN BODY REMOVAL N/A 07/15/2016   Procedure: FOREIGN BODY REMOVAL;  Surgeon: Lucilla Lame, MD;  Location: [facility] ENDOSCOPY;  Service: Endoscopy;  Laterality: N/A;  . KNEE ARTHROSCOPY Right 12/09/2018   Procedure: ARTHROSCOPY KNEE WITH PARTIAL LATERAL MENISCECTOMY;  Surgeon: Thornton Park, MD;  Location: [facility] ORS;  Service: Orthopedics;  Laterality: Right;  . TOTAL ABDOMINAL HYSTERECTOMY      Family History  Problem Relation Age of Onset  . Alcohol abuse Mother   . Heart disease Father 21  . Hypertension Father   . Hypertension Brother   . Hyperlipidemia Brother   . Heart disease Brother 62  . Asthma Maternal Grandmother   . Stroke Maternal Grandfather   . Hypertension Maternal Grandfather   . Congestive Heart Failure Paternal Grandmother   . Heart disease Paternal Grandfather   . Congestive Heart Failure Paternal Grandfather      Current Outpatient Medications:  .  albuterol (VENTOLIN HFA) 108 (90 Base) MCG/ACT inhaler, Inhale 2 puffs into the lungs every 6 (six) hours as needed for wheezing or shortness of breath.,  Disp: 18 g, Rfl: 2 .  benzonatate (TESSALON) 100 MG capsule, Take 1-2 capsules (100-200 mg total) by mouth 2 (two) times daily as needed., Disp: 40 capsule, Rfl: 0 .  budesonide-formoterol (SYMBICORT) 160-4.5 MCG/ACT inhaler, Inhale 2 puffs into the lungs 2 (two) times daily., Disp: 1 Inhaler, Rfl: 2 .  Cyanocobalamin (VITAMIN B-12 PO), Take by mouth daily., Disp: ,  Rfl:  .  fluticasone (FLONASE) 50 MCG/ACT nasal spray, Place 2 sprays into both nostrils daily., Disp: 16 g, Rfl: 6 .  hydrochlorothiazide (HYDRODIURIL) 25 MG tablet, Take 25 mg by mouth daily., Disp: , Rfl:  .  loratadine (CLARITIN) 10 MG tablet, Take 10 mg by mouth daily., Disp: , Rfl:  .  meclizine (ANTIVERT) 25 MG tablet, Take 0.5-1 tablets (12.5-25 mg total) by mouth 2 (two) times daily as needed for dizziness., Disp: 100 tablet, Rfl: 0 .  Multiple Vitamin (MULTIVITAMIN) capsule, Take 1 capsule by mouth daily., Disp: , Rfl:  .  omeprazole (PRILOSEC) 40 MG capsule, Take 1 capsule (40 mg total) by mouth daily., Disp: 90 capsule, Rfl: 1 .  rosuvastatin (CRESTOR) 10 MG tablet, Take 1 tablet (10 mg total) by mouth daily., Disp: 90 tablet, Rfl: 1 .  VITAMIN D PO, Take by mouth daily., Disp: , Rfl:   No Known Allergies  I personally reviewed active problem list, medication list, allergies, family history, social history with the patient/caregiver today.   ROS  Ten systems reviewed and is negative except as mentioned in HPI   Objective  Vitals:   12/07/20 1543  BP: (!) 142/86  Pulse: 95  Temp: 99.6 F (37.6 C)  SpO2: 99%    There is no height or weight on file to calculate BMI.  Physical Exam  Awake, alert and oriented   PHQ2/9:  Fall Risk: Fall Risk  12/07/2020 03/31/2020 03/09/2020 11/11/2019 09/28/2019  Falls in the past year? 0 0 0 0 0  Number falls in past yr: 0 0 0 0 0  Injury with Fall? 0 0 0 0 0  Risk for fall due to : - - - - -  Follow up - - - - Falls evaluation completed     Assessment & Plan  1. Viral illness   2. Suspected COVID-19 virus infection  - Novel Coronavirus, NAA ([facility])  3. Chronic obstructive pulmonary disease with acute exacerbation ([place])  Discussed importance of resuming Symbicort, monitor pulse ox at home, she is a good candidate for infusion if COVID-19 test positive. Symptoms started today She will monitor pulse ox at home and go to  [facility] if it drops to 92 % and below, avoid sedating medications. She will continue to drink plenty of fluids and will take Tylenol prn body aches , headaches, chills.    I discussed the assessment and treatment plan with the patient. The patient was provided an opportunity to ask questions and all were answered. The patient agreed with the plan and demonstrated an understanding of the instructions.  The patient was advised to call back or seek an in-person evaluation if the symptoms worsen or if the condition fails to improve as anticipated.  I provided 15  minutes of non-face-to-face time during this encounter.

## 2020-12-07 ENCOUNTER — Other Ambulatory Visit: Payer: Self-pay | Admitting: Family Medicine

## 2020-12-07 ENCOUNTER — Telehealth (INDEPENDENT_AMBULATORY_CARE_PROVIDER_SITE_OTHER): Payer: 59 | Admitting: Family Medicine

## 2020-12-07 ENCOUNTER — Encounter: Payer: Self-pay | Admitting: Family Medicine

## 2020-12-07 VITALS — BP 142/86 | HR 95 | Temp 99.6°F

## 2020-12-07 DIAGNOSIS — J441 Chronic obstructive pulmonary disease with (acute) exacerbation: Secondary | ICD-10-CM | POA: Diagnosis not present

## 2020-12-07 DIAGNOSIS — B349 Viral infection, unspecified: Secondary | ICD-10-CM | POA: Diagnosis not present

## 2020-12-07 DIAGNOSIS — Z1231 Encounter for screening mammogram for malignant neoplasm of breast: Secondary | ICD-10-CM

## 2020-12-07 DIAGNOSIS — Z20822 Contact with and (suspected) exposure to covid-19: Secondary | ICD-10-CM | POA: Diagnosis not present

## 2020-12-07 MED ORDER — BENZONATATE 100 MG PO CAPS: 100.0000 mg | ORAL_CAPSULE | Freq: Two times a day (BID) | ORAL | 0 refills | Status: DC | PRN

## 2020-12-10 LAB — SARS-COV-2, NAA 2 DAY TAT

## 2020-12-10 LAB — NOVEL CORONAVIRUS, NAA: SARS-CoV-2, NAA: DETECTED — AB

## 2020-12-13 ENCOUNTER — Telehealth: Payer: Self-pay

## 2020-12-13 NOTE — Telephone Encounter (Signed)
Work Note

## 2020-12-23 ENCOUNTER — Telehealth: Payer: Self-pay

## 2020-12-25 ENCOUNTER — Ambulatory Visit (INDEPENDENT_AMBULATORY_CARE_PROVIDER_SITE_OTHER): Payer: 59

## 2020-12-25 ENCOUNTER — Ambulatory Visit
Admission: EM | Admit: 2020-12-25 | Discharge: 2020-12-25 | Disposition: A | Discharge status: Home / Self Care | Payer: 59 | Attending: Emergency Medicine | Admitting: Emergency Medicine

## 2020-12-25 ENCOUNTER — Other Ambulatory Visit: Payer: Self-pay

## 2020-12-25 DIAGNOSIS — U099 Post covid-19 condition, unspecified: Secondary | ICD-10-CM

## 2020-12-25 DIAGNOSIS — R059 Cough, unspecified: Secondary | ICD-10-CM | POA: Diagnosis not present

## 2020-12-25 DIAGNOSIS — R0989 Other specified symptoms and signs involving the circulatory and respiratory systems: Secondary | ICD-10-CM

## 2020-12-25 DIAGNOSIS — Z8616 Personal history of COVID-19: Secondary | ICD-10-CM | POA: Diagnosis not present

## 2020-12-25 DIAGNOSIS — R053 Chronic cough: Secondary | ICD-10-CM

## 2020-12-25 DIAGNOSIS — R0981 Nasal congestion: Secondary | ICD-10-CM

## 2020-12-25 MED ORDER — PREDNISONE 10 MG (21) PO TBPK: ORAL_TABLET | Freq: Every day | ORAL | 0 refills | Status: DC

## 2020-12-25 MED ORDER — PROMETHAZINE-DM 6.25-15 MG/5ML PO SYRP: 5.0000 mL | ORAL_SOLUTION | Freq: Four times a day (QID) | ORAL | 0 refills | Status: DC | PRN

## 2020-12-25 NOTE — ED Triage Notes (Signed)
Pt presents with c/o some chest congestion, cough and nasal congestion since Friday. Pt does report some bright red blood in her mucus. Pt does have history of COPD. Pt also reports fever yesterday 100.5. Pt has been taking Astelin nasal spray. Pt did have COVID 12/07/19. Pt states she did improve after this episode.

## 2020-12-25 NOTE — Discharge Instructions (Addendum)
Rest x-rays not show any signs of pneumonia.  Take the prednisone according to the package instructions to help with the inflammation that could be causing her cough.  Continue the Tessalon Perles during the day as needed for cough and the albuterol inhaler for any shortness of breath or wheezing.  Use the Promethazine DM cough syrup at bedtime as needed for cough.  Your cough may last up to 6 weeks or longer.

## 2020-12-25 NOTE — ED Provider Notes (Signed)
[facility]    CSN: [id number] Arrival date & time: 12/25/20  1204      History   Chief Complaint Chief Complaint  Patient presents with  . Cough    HPI Joan Davis is a 55 y.o. female.   HPI   55 year old female here for evaluation of cough, chest congestion, and nasal congestion that started 2 days ago.  Patient reports that she has been coughing up bright red spots of blood in her mucus and that started last night.  Patient does have a history of COPD.  Patient also reports that she started running a low-grade fever of 100.5 yesterday.  Patient has been taking Astelin nasal spray.  Patient was diagnosed with COVID on 12/07/2020.  Past Medical History:  Diagnosis Date  . COPD (chronic obstructive pulmonary disease) ([place])   . GERD (gastroesophageal reflux disease)   . Hyperlipidemia   . Hypertension   . Vertigo     Patient Active Problem List   Diagnosis Date Noted  . Multinodular goiter 09/13/2020  . Palpitations 05/13/2020  . Chest pain of uncertain etiology 25/95/6387  . H/O arthroscopy of right knee 01/14/2019  . Class 2 severe obesity due to excess calories with serious comorbidity and body mass index (BMI) of 36.0 to 36.9 in adult ([place]) 01/14/2019  . Morbid obesity ([place]) 01/14/2019  . Mood swings 01/14/2019  . Hot flushes, perimenopausal 01/14/2019  . Tobacco use disorder 01/14/2019  . Gastroesophageal reflux disease without esophagitis 01/14/2019  . Mixed hyperlipidemia 07/29/2018  . Dizziness 02/06/2018  . COPD (chronic obstructive pulmonary disease) ([place]) 12/20/2017  . Hypertension 12/20/2017  . Food impaction of esophagus     Past Surgical History:  Procedure Laterality Date  . CHOLECYSTECTOMY    . CHONDROPLASTY Right 12/09/2018   Procedure: CHONDROPLASTY LATERAL FEMORAL CONDYLE;  Surgeon: Thornton Park, MD;  Location: [facility] ORS;  Service: Orthopedics;  Laterality: Right;  . ESOPHAGOGASTRODUODENOSCOPY (EGD) WITH PROPOFOL N/A  08/06/2019   Procedure: ESOPHAGOGASTRODUODENOSCOPY (EGD) WITH PROPOFOL;  Surgeon: Jonathon Bellows, MD;  Location[facility] ENDOSCOPY;  Service: Gastroenterology;  Laterality: N/A;  . FOREIGN BODY REMOVAL N/A 07/15/2016   Procedure: FOREIGN BODY REMOVAL;  Surgeon: Lucilla Lame, MD;  Location: [facility] ENDOSCOPY;  Service: Endoscopy;  Laterality: N/A;  . KNEE ARTHROSCOPY Right 12/09/2018   Procedure: ARTHROSCOPY KNEE WITH PARTIAL LATERAL MENISCECTOMY;  Surgeon: Thornton Park, MD;  Location: [facility] ORS;  Service: Orthopedics;  Laterality: Right;  . TOTAL ABDOMINAL HYSTERECTOMY      OB History   No obstetric history on file.      Home Medications    Prior to Admission medications   Medication Sig Start Date End Date Taking? Authorizing Provider  albuterol (VENTOLIN HFA) 108 (90 Base) MCG/ACT inhaler Inhale 2 puffs into the lungs every 6 (six) hours as needed for wheezing or shortness of breath. 09/09/19  Yes Hubbard Hartshorn, FNP  benzonatate (TESSALON) 100 MG capsule Take 1-2 capsules (100-200 mg total) by mouth 2 (two) times daily as needed. 12/07/20  Yes Sowles, Drue Stager, MD  budesonide-formoterol [facility]) 160-4.5 MCG/ACT inhaler Inhale 2 puffs into the lungs 2 (two) times daily. 09/09/19  Yes Hubbard Hartshorn, FNP  Cyanocobalamin (VITAMIN B-12 PO) Take by mouth daily.   Yes [provider]  fluticasone (FLONASE) 50 MCG/ACT nasal spray Place 2 sprays into both nostrils daily. 09/28/19  Yes Hubbard Hartshorn, FNP  hydrochlorothiazide (HYDRODIURIL) 25 MG tablet Take 25 mg by mouth daily. 05/25/20  Yes [provider]  loratadine (  CLARITIN) 10 MG tablet Take 10 mg by mouth daily.   Yes [provider]  meclizine (ANTIVERT) 25 MG tablet Take 0.5-1 tablets (12.5-25 mg total) by mouth 2 (two) times daily as needed for dizziness. 03/31/20  Yes Sowles, Drue Stager, MD  Multiple Vitamin (MULTIVITAMIN) capsule Take 1 capsule by mouth daily.   Yes [provider]  omeprazole (PRILOSEC) 40 MG  capsule Take 1 capsule (40 mg total) by mouth daily. 03/09/20  Yes Sowles, Drue Stager, MD  predniSONE (STERAPRED UNI-PAK 21 TAB) 10 MG (21) TBPK tablet Take by mouth daily. Take 6 tabs by mouth daily  for 2 days, then 5 tabs for 2 days, then 4 tabs for 2 days, then 3 tabs for 2 days, 2 tabs for 2 days, then 1 tab by mouth daily for 2 days 12/25/20  Yes Margarette Canada, NP  promethazine-dextromethorphan (PROMETHAZINE-DM) 6.25-15 MG/5ML syrup Take 5 mLs by mouth 4 (four) times daily as needed. 12/25/20  Yes Margarette Canada, NP  rosuvastatin (CRESTOR) 10 MG tablet Take 1 tablet (10 mg total) by mouth daily. 03/18/20  Yes Steele Sizer, MD  VITAMIN D PO Take by mouth daily.   Yes [provider]    Family History Family History  Problem Relation Age of Onset  . Alcohol abuse Mother   . Heart disease Father 26  . Hypertension Father   . Hypertension Brother   . Hyperlipidemia Brother   . Heart disease Brother 85  . Asthma Maternal Grandmother   . Stroke Maternal Grandfather   . Hypertension Maternal Grandfather   . Congestive Heart Failure Paternal Grandmother   . Heart disease Paternal Grandfather   . Congestive Heart Failure Paternal Grandfather     Social History Social History   Tobacco Use  . Smoking status: Current Every Day Smoker    Packs/day: 0.50    Years: 31.00    Pack years: 15.50    Types: Cigarettes    Start date: 03/09/1988  . Smokeless tobacco: Never Used  Vaping Use  . Vaping Use: Never used  Substance Use Topics  . Alcohol use: No  . Drug use: No     Allergies   Patient has no known allergies.   Review of Systems Review of Systems  Constitutional: Positive for fever. Negative for activity change.  HENT: Positive for congestion. Negative for ear pain, rhinorrhea and sore throat.   Respiratory: Positive for cough. Negative for shortness of breath and wheezing.   Cardiovascular: Negative for chest pain.  Gastrointestinal: Negative for diarrhea, nausea and  vomiting.  Musculoskeletal: Negative for arthralgias and myalgias.  Skin: Negative for rash.  Hematological: Negative.   Psychiatric/Behavioral: Negative.      Physical Exam Triage Vital Signs ED Triage Vitals  Enc Vitals Group     BP 12/25/20 1230 140/86     Pulse Rate 12/25/20 1230 70     Resp 12/25/20 1230 18     Temp 12/25/20 1230 97.9 F (36.6 C)     Temp Source 12/25/20 1230 Oral     SpO2 12/25/20 1230 100 %     Weight 12/25/20 1229 266 lb (120.7 kg)     Height 12/25/20 1229 5' 8.5" (1.74 m)     Head Circumference --      Peak Flow --      Pain Score 12/25/20 1229 0     Pain Loc --      Pain Edu? --      Excl. in [place]? --  No data found.  Updated Vital Signs BP 140/86 (BP Location: Left Arm)   Pulse 70   Temp 97.9 F (36.6 C) (Oral)   Resp 18   Ht 5' 8.5" (1.74 m)   Wt 266 lb (120.7 kg)   SpO2 100%   BMI 39.86 kg/m   Visual Acuity Right Eye Distance:   Left Eye Distance:   Bilateral Distance:    Right Eye Near:   Left Eye Near:    Bilateral Near:     Physical Exam Vitals and nursing note reviewed.  Constitutional:      General: She is not in acute distress.    Appearance: Normal appearance. She is not ill-appearing.  HENT:     Head: Normocephalic and atraumatic.     Right Ear: Tympanic membrane, ear canal and external ear normal.     Left Ear: Tympanic membrane, ear canal and external ear normal.     Nose: Congestion and rhinorrhea present.     Comments: Mucosa is erythematous and edematous with scant clear nasal discharge.    Mouth/Throat:     Mouth: Mucous membranes are moist.     Pharynx: Oropharynx is clear. No oropharyngeal exudate or posterior oropharyngeal erythema.  Cardiovascular:     Rate and Rhythm: Normal rate and regular rhythm.     Pulses: Normal pulses.     Heart sounds: Normal heart sounds. No murmur heard. No gallop.   Pulmonary:     Effort: Pulmonary effort is normal.     Breath sounds: Normal breath sounds. No wheezing,  rhonchi or rales.  Musculoskeletal:     Cervical back: Normal range of motion.  Lymphadenopathy:     Cervical: No cervical adenopathy.  Skin:    General: Skin is warm and dry.     Capillary Refill: Capillary refill takes less than 2 seconds.     Findings: No erythema or rash.  Neurological:     General: No focal deficit present.     Mental Status: She is alert and oriented to person, place, and time.  Psychiatric:        Mood and Affect: Mood normal.        Behavior: Behavior normal.        Thought Content: Thought content normal.        Judgment: Judgment normal.      UC Treatments / Results  Labs (all labs ordered are listed, but only abnormal results are displayed) Labs Reviewed - No data to display  EKG   Radiology DG Chest 2 View  Result Date: 12/25/2020 CLINICAL DATA:  Chest congestion, cough and nasal congestion since Friday, some bright red blood in mucus, history COPD, fever to 100.5 degrees EXAM: CHEST - 2 VIEW COMPARISON:  09/21/2019 FINDINGS: Normal heart size, mediastinal contours, and pulmonary vascularity. Mild chronic bronchitic changes. Lungs otherwise clear. No pleural effusion or pneumothorax. Bones unremarkable. IMPRESSION: Mild chronic bronchitic changes without infiltrate. Electronically Signed   By: Lavonia Dana M.D.   On: 12/25/2020 13:19    Procedures Procedures (including critical care time)  Medications Ordered in UC Medications - No data to display  Initial Impression / Assessment and Plan / UC Course  I have reviewed the triage vital signs and the nursing notes.  Pertinent labs & imaging results that were available during my care of the patient were reviewed by me and considered in my medical decision making (see chart for details).   Duration of cough and coughing up bloody sputum this been  going for last 2 days.  Patient reports the bloody sputum actually started last night.  Patient is not tachypneic or hypoxic.  She can speak in full  sentences.  Lungs are clear to auscultation in all fields.  Patient had Covid earlier this month.  She tested positive on 12/07/2020.  Will obtain chest radiograph to look for acute intrathoracic process.  Radiology interpretation of chest x-ray is that of chronic bronchitic changes but no acute intrathoracic processes or focal infiltrates.  Final Clinical Impressions(s) / UC Diagnoses   Final diagnoses:  Post-COVID chronic cough     Discharge Instructions     Rest x-rays not show any signs of pneumonia.  Take the prednisone according to the package instructions to help with the inflammation that could be causing her cough.  Continue the Tessalon Perles during the day as needed for cough and the albuterol inhaler for any shortness of breath or wheezing.  Use the Promethazine DM cough syrup at bedtime as needed for cough.  Your cough may last up to 6 weeks or longer.    ED Prescriptions    Medication Sig Dispense Auth. Provider   predniSONE (STERAPRED UNI-PAK 21 TAB) 10 MG (21) TBPK tablet Take by mouth daily. Take 6 tabs by mouth daily  for 2 days, then 5 tabs for 2 days, then 4 tabs for 2 days, then 3 tabs for 2 days, 2 tabs for 2 days, then 1 tab by mouth daily for 2 days 42 tablet Margarette Canada, NP   promethazine-dextromethorphan (PROMETHAZINE-DM) 6.25-15 MG/5ML syrup Take 5 mLs by mouth 4 (four) times daily as needed. 118 mL Margarette Canada, NP     PDMP not reviewed this encounter.   Margarette Canada, NP 12/25/20 1336

## 2020-12-27 ENCOUNTER — Ambulatory Visit (INDEPENDENT_AMBULATORY_CARE_PROVIDER_SITE_OTHER): Payer: 59 | Admitting: Family Medicine

## 2020-12-27 ENCOUNTER — Other Ambulatory Visit: Payer: Self-pay

## 2020-12-27 ENCOUNTER — Encounter: Payer: Self-pay | Admitting: Family Medicine

## 2020-12-27 VITALS — BP 136/72 | HR 113 | Temp 98.0°F | Resp 17 | Ht 69.0 in | Wt 263.9 lb

## 2020-12-27 DIAGNOSIS — F341 Dysthymic disorder: Secondary | ICD-10-CM | POA: Diagnosis not present

## 2020-12-27 DIAGNOSIS — R000 Tachycardia, unspecified: Secondary | ICD-10-CM

## 2020-12-27 DIAGNOSIS — J302 Other seasonal allergic rhinitis: Secondary | ICD-10-CM

## 2020-12-27 DIAGNOSIS — Z1231 Encounter for screening mammogram for malignant neoplasm of breast: Secondary | ICD-10-CM

## 2020-12-27 DIAGNOSIS — N644 Mastodynia: Secondary | ICD-10-CM | POA: Insufficient documentation

## 2020-12-27 DIAGNOSIS — Z23 Encounter for immunization: Secondary | ICD-10-CM

## 2020-12-27 DIAGNOSIS — J3089 Other allergic rhinitis: Secondary | ICD-10-CM

## 2020-12-27 DIAGNOSIS — I10 Essential (primary) hypertension: Secondary | ICD-10-CM | POA: Diagnosis not present

## 2020-12-27 DIAGNOSIS — J410 Simple chronic bronchitis: Secondary | ICD-10-CM | POA: Diagnosis not present

## 2020-12-27 DIAGNOSIS — E782 Mixed hyperlipidemia: Secondary | ICD-10-CM

## 2020-12-27 DIAGNOSIS — Z8616 Personal history of COVID-19: Secondary | ICD-10-CM

## 2020-12-27 DIAGNOSIS — J441 Chronic obstructive pulmonary disease with (acute) exacerbation: Secondary | ICD-10-CM

## 2020-12-27 DIAGNOSIS — R042 Hemoptysis: Secondary | ICD-10-CM

## 2020-12-27 DIAGNOSIS — N6311 Unspecified lump in the right breast, upper outer quadrant: Secondary | ICD-10-CM

## 2020-12-27 DIAGNOSIS — K219 Gastro-esophageal reflux disease without esophagitis: Secondary | ICD-10-CM

## 2020-12-27 MED ORDER — AZELASTINE HCL 0.1 % NA SOLN: 2.0000 | Freq: Two times a day (BID) | NASAL | 2 refills | Status: AC

## 2020-12-27 MED ORDER — FLUTICASONE PROPIONATE 50 MCG/ACT NA SUSP
2.0000 | Freq: Every day | NASAL | 0 refills | Status: AC
Start: 2020-12-27 — End: ?

## 2020-12-27 MED ORDER — DULERA 200-5 MCG/ACT IN AERO: 2.0000 | INHALATION_SPRAY | Freq: Two times a day (BID) | RESPIRATORY_TRACT | 2 refills | Status: DC

## 2020-12-27 MED ORDER — HYDROCHLOROTHIAZIDE 25 MG PO TABS: 25.0000 mg | ORAL_TABLET | Freq: Every day | ORAL | 1 refills | Status: DC

## 2020-12-27 MED ORDER — ROSUVASTATIN CALCIUM 10 MG PO TABS: 10.0000 mg | ORAL_TABLET | Freq: Every day | ORAL | 1 refills | Status: DC

## 2020-12-27 MED ORDER — AZELASTINE HCL 0.1 % NA SOLN: 2.0000 | Freq: Two times a day (BID) | NASAL | 2 refills | Status: DC

## 2020-12-27 MED ORDER — FLUTICASONE PROPIONATE 50 MCG/ACT NA SUSP: 2.0000 | Freq: Every day | NASAL | 0 refills | Status: DC

## 2020-12-27 MED ORDER — ALBUTEROL SULFATE HFA 108 (90 BASE) MCG/ACT IN AERS: 2.0000 | INHALATION_SPRAY | Freq: Four times a day (QID) | RESPIRATORY_TRACT | 1 refills | Status: DC | PRN

## 2020-12-27 NOTE — Progress Notes (Signed)
Name: Joan Davis   MRN: [id number]    DOB: [birth year]   Date:12/27/2020       Progress Note  Subjective  Chief Complaint  Follow Up Breast Pain- Right  HPI  COVID-19 infection: diagnosed on 12/07/2020, was feeling better however over the weekend had increase in cough and noticed initially green sputum followed by blood in sputum, CXR showed bronchitis, she was given prednisone and is feeling a little better. She is still taking medication. She is a smoker, discussed checking CT chest . She denies sob but still has nasal congestion and increase in fatigue.   Breast tenderness: she states over the past month she has noticed pain on right upper breast quadrant, no lumps, she needs a mammogram , not sure if needs diagnostic, no rashes.   Hypertension: bp is at goal, she is complaint with bp medication, she denies leg cramps, taking magnesium   Dyslipidemia: she never started statins, her risk is high and discussed the importance of considering medication She states she will take it now and recheck labs during CPE   The 10-year ASCVD risk score Mikey Bussing DC Brooke Bonito., et al., 2013) is: 13.9%   Values used to calculate the score:     Age: 55 years     Sex: Female     Is Non-Hispanic African American: Yes     Diabetic: No     Tobacco smoker: Yes     Systolic Blood Pressure: [id number] mmHg     Is BP treated: Yes     HDL Cholesterol: 44 mg/dL     Total Cholesterol: 202 mg/dL  Morbid obesity: she states she has always been overweight, however after her third pregnancy she started to gain more weight, and her heaviest was 309 lbs a couple of years ago. She states at the time she changed her eating habits - avoiding bread, desserts and also going to the gym daily. She lost down to 268 lbs  She has been drinking more water, eating more fruit and vegetables and avoiding sodas and bread. She took drink to shrink for two months and weight went down from 265 lbs at the time to  257 lbs today is 263.9 lbs. She is  now only on a church fast .   Vertigo: she has a long history of recurrent vertigo. Worse with head movement, associated with nausea but no vomiting. Not associated with headaches. Never seen by ENT. She has intermittent tinnitus on right side only/seems to be a little worse lately  - she also had some lymphadenopathy left side and ear pain but resolved.   COPD: she has been smoking since age 30 , she used to smoke a pack a day, went down to half pack day last year. She has only been using Symbicort prn, however sick since covid and we will have to change to [facility] due to insurance   GERD: she used to take PPI, but stopped a long time , she was seen by Dr. Vicente Males and had EGD done 07/2019 that showed reflux esophagitis, she states at the time she was having constant chest fullness and dysphagia. She has been taking omeprazole a few times a week and seems to help with symptoms   Dysthymia: she has felt down for a numbers of years intermittently, never severe , does not want medication. Unchanged   Multinodular goiter : she had an US done 08/04/2020 and she saw Dr. Honor Junes 08/2020, she needs a FNA . Advised her to schedule appointment  as recommended  Patient Active Problem List   Diagnosis Date Noted  . Pain of right breast 12/27/2020  . Multinodular goiter 09/13/2020  . Palpitations 05/13/2020  . Chest pain of uncertain etiology [id number]  . H/O arthroscopy of right knee 01/14/2019  . Class 2 severe obesity due to excess calories with serious comorbidity and body mass index (BMI) of 36.0 to 36.9 in adult ([place]) 01/14/2019  . Morbid obesity ([place]) 01/14/2019  . Mood swings 01/14/2019  . Hot flushes, perimenopausal 01/14/2019  . Tobacco use disorder 01/14/2019  . Gastroesophageal reflux disease without esophagitis 01/14/2019  . Mixed hyperlipidemia 07/29/2018  . Dizziness 02/06/2018  . COPD (chronic obstructive pulmonary disease) ([place]) 12/20/2017  . Hypertension 12/20/2017  . Food  impaction of esophagus     Past Surgical History:  Procedure Laterality Date  . CHOLECYSTECTOMY    . CHONDROPLASTY Right 12/09/2018   Procedure: CHONDROPLASTY LATERAL FEMORAL CONDYLE;  Surgeon: Thornton Park, MD;  Location: [facility] ORS;  Service: Orthopedics;  Laterality: Right;  . ESOPHAGOGASTRODUODENOSCOPY (EGD) WITH PROPOFOL N/A 08/06/2019   Procedure: ESOPHAGOGASTRODUODENOSCOPY (EGD) WITH PROPOFOL;  Surgeon: Jonathon Bellows, MD;  Location: [facility] ENDOSCOPY;  Service: Gastroenterology;  Laterality: N/A;  . FOREIGN BODY REMOVAL N/A 07/15/2016   Procedure: FOREIGN BODY REMOVAL;  Surgeon: Lucilla Lame, MD;  Location: [facility] ENDOSCOPY;  Service: Endoscopy;  Laterality: N/A;  . KNEE ARTHROSCOPY Right 12/09/2018   Procedure: ARTHROSCOPY KNEE WITH PARTIAL LATERAL MENISCECTOMY;  Surgeon: Thornton Park, MD;  Location: [facility] ORS;  Service: Orthopedics;  Laterality: Right;  . TOTAL ABDOMINAL HYSTERECTOMY      Family History  Problem Relation Age of Onset  . Alcohol abuse Mother   . Heart disease Father 66  . Hypertension Father   . Hypertension Brother   . Hyperlipidemia Brother   . Heart disease Brother 51  . Asthma Maternal Grandmother   . Stroke Maternal Grandfather   . Hypertension Maternal Grandfather   . Congestive Heart Failure Paternal Grandmother   . Heart disease Paternal Grandfather   . Congestive Heart Failure Paternal Grandfather     Social History   Tobacco Use  . Smoking status: Current Every Day Smoker    Packs/day: 0.50    Years: 31.00    Pack years: 15.50    Types: Cigarettes    Start date: 03/09/1988  . Smokeless tobacco: Never Used  Substance Use Topics  . Alcohol use: No     Current Outpatient Medications:  .  Cyanocobalamin (VITAMIN B-12 PO), Take by mouth daily., Disp: , Rfl:  .  loratadine (CLARITIN) 10 MG tablet, Take 10 mg by mouth daily., Disp: , Rfl:  .  meclizine (ANTIVERT) 25 MG tablet, Take 0.5-1 tablets (12.5-25 mg total) by mouth 2 (two) times daily as  needed for dizziness., Disp: 100 tablet, Rfl: 0 .  Multiple Vitamin (MULTIVITAMIN) capsule, Take 1 capsule by mouth daily., Disp: , Rfl:  .  omeprazole (PRILOSEC) 40 MG capsule, Take 1 capsule (40 mg total) by mouth daily., Disp: 90 capsule, Rfl: 1 .  predniSONE (STERAPRED UNI-PAK 21 TAB) 10 MG (21) TBPK tablet, Take by mouth daily. Take 6 tabs by mouth daily  for 2 days, then 5 tabs for 2 days, then 4 tabs for 2 days, then 3 tabs for 2 days, 2 tabs for 2 days, then 1 tab by mouth daily for 2 days, Disp: 42 tablet, Rfl: 0 .  promethazine-dextromethorphan (PROMETHAZINE-DM) 6.25-15 MG/5ML syrup, Take 5 mLs by mouth 4 (four) times daily as needed. (Patient taking  differently: Take 5 mLs by mouth 4 (four) times daily as needed.), Disp: 118 mL, Rfl: 0 .  VITAMIN D PO, Take by mouth daily., Disp: , Rfl:  .  albuterol (VENTOLIN HFA) 108 (90 Base) MCG/ACT inhaler, Inhale 2 puffs into the lungs every 6 (six) hours as needed for wheezing or shortness of breath., Disp: 18 g, Rfl: 1 .  azelastine (ASTELIN) 0.1 % nasal spray, Place 2 sprays into both nostrils 2 (two) times daily. Use in each nostril as directed, Disp: 30 mL, Rfl: 2 .  fluticasone (FLONASE) 50 MCG/ACT nasal spray, Place 2 sprays into both nostrils daily., Disp: 16 g, Rfl: 0 .  hydrochlorothiazide (HYDRODIURIL) 25 MG tablet, Take 1 tablet (25 mg total) by mouth daily., Disp: 90 tablet, Rfl: 1 .  mometasone-formoterol (DULERA) 200-5 MCG/ACT AERO, Inhale 2 puffs into the lungs in the morning and at bedtime., Disp: 1 each, Rfl: 2 .  rosuvastatin (CRESTOR) 10 MG tablet, Take 1 tablet (10 mg total) by mouth daily., Disp: 90 tablet, Rfl: 1  No Known Allergies  I personally reviewed active problem list, medication list, allergies, family history, social history, health maintenance with the patient/caregiver today.   ROS  Constitutional: Negative for fever or weight change.  Respiratory: Negative for cough and shortness of breath.   Cardiovascular:  Negative for chest pain or palpitations.  Gastrointestinal: Negative for abdominal pain, no bowel changes.  Musculoskeletal: Negative for gait problem or joint swelling.  Skin: Negative for rash.  Neurological: Negative for dizziness or headache.  No other specific complaints in a complete review of systems (except as listed in HPI above).  Objective  Vitals:   12/27/20 1311  BP: 136/72  Pulse: (!) 113  Resp: 17  Temp: 98 F (36.7 C)  TempSrc: Oral  SpO2: 97%  Weight: 263 lb 14.4 oz (119.7 kg)  Height: 5\' 9"  (1.753 m)    Body mass index is 38.97 kg/m.  Physical Exam  Constitutional: Patient appears well-developed and well-nourished. Obese No distress.  HEENT: head atraumatic, normocephalic, pupils equal and reactive to light,  neck supple, goiter  Cardiovascular: Normal rate, regular rhythm and normal heart sounds.  No murmur heard. No BLE edema. Pulmonary/Chest: Effort normal and breath sounds normal. No respiratory distress. Abdominal: Soft.  There is no tenderness. Breast: tender lump at 11 o'clock near nipple, no redness of skin, or nipple discharge.  Psychiatric: Patient has a normal mood and affect. behavior is normal. Judgment and thought content normal.  Recent Results (from the past 2160 hour(s))  Novel Coronavirus, NAA ([facility])     Status: Abnormal   Collection Time: 12/07/20 12:00 AM   Specimen: Nasopharyngeal(NP) swabs in vial transport medium   Nasopharynge  Is this  Result Value Ref Range   SARS-CoV-2, NAA Detected (A) Not Detected    Comment: Patients who have a positive COVID-19 test result may now have treatment options. Treatment options are available for patients with mild to moderate symptoms and for hospitalized patients. Visit our website at [email or web address] for resources and information. This nucleic acid amplification test was developed and its performance characteristics determined by [employer]. Nucleic  acid amplification tests include RT-PCR and TMA. This test has not been FDA cleared or approved. This test has been authorized by FDA under an Emergency Use Authorization (EUA). This test is only authorized for the duration of time the declaration that circumstances exist justifying the authorization of the emergency use of in vitro diagnostic tests for detection of SARS-CoV-2 virus and/or  diagnosis of COVID-19 infection under section 564(b)(1) of the Act, 21 U.S.C. [id number]) (1), unless the authorization is terminated or revoked sooner. When diagnostic testing is negativ e, the possibility of a false negative result should be considered in the context of a patient's recent exposures and the presence of clinical signs and symptoms consistent with COVID-19. An individual without symptoms of COVID-19 and who is not shedding SARS-CoV-2 virus would expect to have a negative (not detected) result in this assay.   SARS-COV-2, NAA 2 DAY TAT     Status: None   Collection Time: 12/07/20 12:00 AM   Nasopharynge  Is this  Result Value Ref Range   SARS-CoV-2, NAA 2 DAY TAT Performed       PHQ2/9: Depression screen [facility] 2/9 12/27/2020 03/31/2020 03/09/2020 11/11/2019 09/28/2019  Decreased Interest 0 3 2 0 0  Down, Depressed, Hopeless 0 1 0 0 0  PHQ - 2 Score 0 4 2 0 0  Altered sleeping 0 1 1 0 0  Tired, decreased energy 0 3 2 0 0  Change in appetite 0 2 0 0 0  Feeling bad or failure about yourself  0 0 0 0 0  Trouble concentrating 0 2 1 0 0  Moving slowly or fidgety/restless 0 0 0 0 0  Suicidal thoughts 0 0 0 0 0  PHQ-9 Score 0 12 6 0 0  Difficult doing work/chores - Somewhat difficult Not difficult at all Not difficult at all Not difficult at all  Some recent data might be hidden    phq 9 is negative   Fall Risk: Fall Risk  12/27/2020 12/07/2020 03/31/2020 03/09/2020 11/11/2019  Falls in the past year? 0 0 0 0 0  Number falls in past yr: 0 0 0 0 0  Injury with Fall? 0 0 0 0 0  Risk for  fall due to : - - - - -  Follow up - - - - -     Functional Status Survey: Is the patient deaf or have difficulty hearing?: No Does the patient have difficulty seeing, even when wearing glasses/contacts?: No Does the patient have difficulty concentrating, remembering, or making decisions?: No Does the patient have difficulty walking or climbing stairs?: No Does the patient have difficulty dressing or bathing?: No Does the patient have difficulty doing errands alone such as visiting a doctor's office or shopping?: No   Assessment & Plan  1. Dysthymia  Doing well at this time   2. Essential hypertension  - hydrochlorothiazide (HYDRODIURIL) 25 MG tablet; Take 1 tablet (25 mg total) by mouth daily.  Dispense: 90 tablet; Refill: 1  3. Morbid obesity ([place])  Discussed with the patient the risk posed by an increased BMI. Discussed importance of portion control, calorie counting and at least 150 minutes of physical activity weekly. Avoid sweet beverages and drink more water. Eat at least 6 servings of fruit and vegetables daily   4. Mixed hyperlipidemia  - rosuvastatin (CRESTOR) 10 MG tablet; Take 1 tablet (10 mg total) by mouth daily.  Dispense: 90 tablet; Refill: 1  5. Gastroesophageal reflux disease without esophagitis   6. Simple chronic bronchitis ([place])   7. Breast cancer screening by mammogram   8. Chronic obstructive pulmonary disease with acute exacerbation (HCC)  - albuterol (VENTOLIN HFA) 108 (90 Base) MCG/ACT inhaler; Inhale 2 puffs into the lungs every 6 (six) hours as needed for wheezing or shortness of breath.  Dispense: 18 g; Refill: 1 - fluticasone (FLONASE) 50 MCG/ACT nasal spray;  Place 2 sprays into both nostrils daily.  Dispense: 16 g; Refill: 0 - mometasone-formoterol (DULERA) 200-5 MCG/ACT AERO; Inhale 2 puffs into the lungs in the morning and at bedtime.  Dispense: 1 each; Refill: 2  9. Perennial allergic rhinitis with seasonal variation  - azelastine  (ASTELIN) 0.1 % nasal spray; Place 2 sprays into both nostrils 2 (two) times daily. Use in each nostril as directed  Dispense: 30 mL; Refill: 2 - fluticasone (FLONASE) 50 MCG/ACT nasal spray; Place 2 sprays into both nostrils daily.  Dispense: 16 g; Refill: 0  10. Cough with hemoptysis  - CT Chest Wo Contrast; Future  11. History of COVID-19  - CT Chest Wo Contrast; Future  12. Tachycardia  - CT Chest Wo Contrast; Future  13. Pain of right breast   14. Breast lump on right side at 11 o'clock position  - MM Digital Diagnostic [place]; Future - US BREAST LTD UNI LEFT INC AXILLA; Future - US BREAST LTD UNI RIGHT INC AXILLA; Future  15. Needs flu shot  Refused

## 2020-12-27 NOTE — Telephone Encounter (Signed)
Follow up for 12/27/20

## 2020-12-28 ENCOUNTER — Telehealth: Payer: Self-pay

## 2020-12-28 NOTE — Telephone Encounter (Signed)
The inhaler that was prescribed to pt was to expensive and pt is asking for something else to be called in

## 2020-12-30 ENCOUNTER — Telehealth: Payer: Self-pay

## 2020-12-30 ENCOUNTER — Other Ambulatory Visit: Payer: Self-pay | Admitting: Family Medicine

## 2020-12-30 MED ORDER — DOXYCYCLINE HYCLATE 100 MG PO TABS: 100.0000 mg | ORAL_TABLET | Freq: Two times a day (BID) | ORAL | 0 refills | Status: DC

## 2021-01-02 NOTE — Telephone Encounter (Signed)
Pt.notified

## 2021-01-03 ENCOUNTER — Ambulatory Visit
Admission: RE | Admit: 2021-01-03 | Discharge: 2021-01-03 | Disposition: A | Discharge status: Home / Self Care | Payer: 59 | Source: Ambulatory Visit | Attending: Family Medicine | Admitting: Family Medicine

## 2021-01-03 ENCOUNTER — Other Ambulatory Visit: Payer: Self-pay

## 2021-01-03 DIAGNOSIS — N6311 Unspecified lump in the right breast, upper outer quadrant: Secondary | ICD-10-CM | POA: Insufficient documentation

## 2021-01-09 ENCOUNTER — Ambulatory Visit
Admission: RE | Admit: 2021-01-09 | Discharge: 2021-01-09 | Disposition: A | Discharge status: Home / Self Care | Payer: 59 | Source: Ambulatory Visit | Attending: Family Medicine | Admitting: Family Medicine

## 2021-01-09 ENCOUNTER — Other Ambulatory Visit: Payer: Self-pay

## 2021-01-09 DIAGNOSIS — R000 Tachycardia, unspecified: Secondary | ICD-10-CM | POA: Diagnosis present

## 2021-01-09 DIAGNOSIS — R042 Hemoptysis: Secondary | ICD-10-CM | POA: Insufficient documentation

## 2021-01-09 DIAGNOSIS — Z8616 Personal history of COVID-19: Secondary | ICD-10-CM | POA: Insufficient documentation

## 2021-01-10 ENCOUNTER — Other Ambulatory Visit: Payer: Self-pay | Admitting: Family Medicine

## 2021-01-10 DIAGNOSIS — R059 Cough, unspecified: Secondary | ICD-10-CM

## 2021-01-10 DIAGNOSIS — R9389 Abnormal findings on diagnostic imaging of other specified body structures: Secondary | ICD-10-CM

## 2021-01-11 ENCOUNTER — Telehealth: Payer: Self-pay

## 2021-01-11 NOTE — Telephone Encounter (Signed)
Called patient to inform her that she has been scheduled to have her CT Chest on 01/31/21 @ 9am at [facility], but there was no answer. A message was left with that information on her voicemail along with instruction to arrive by 8:30 at the [facility] entrance to get registered and screened.  A CRM will be placed.

## 2021-01-31 ENCOUNTER — Ambulatory Visit: Payer: 59

## 2021-02-23 ENCOUNTER — Encounter: Payer: Self-pay | Admitting: Family Medicine

## 2021-03-27 NOTE — Progress Notes (Signed)
Name: Joan Davis   MRN: [id number]    DOB: [birth year]   Date:03/29/2021       Progress Note  Subjective  Chief Complaint  Annual Exam  HPI  Patient presents for annual CPE.  Diet: she has been doing intermittent fasting , and eating healthy  Exercise: she states has not been as active lately, advised 150 minutes per week.   [place] Office Visit from 03/31/2020 in [facility]  AUDIT-C Score 0     Depression: Phq 9 is  negative Depression screen [facility] 2/9 03/29/2021 12/27/2020 03/31/2020 03/09/2020 11/11/2019  Decreased Interest 0 0 3 2 0  Down, Depressed, Hopeless 0 0 1 0 0  PHQ - 2 Score 0 0 4 2 0  Altered sleeping 0 0 1 1 0  Tired, decreased energy 2 0 3 2 0  Change in appetite 1 0 2 0 0  Feeling bad or failure about yourself  0 0 0 0 0  Trouble concentrating 0 0 2 1 0  Moving slowly or fidgety/restless 0 0 0 0 0  Suicidal thoughts 0 0 0 0 0  PHQ-9 Score 3 0 12 6 0  Difficult doing work/chores Not difficult at all - Somewhat difficult Not difficult at all Not difficult at all  Some recent data might be hidden   Hypertension: BP Readings from Last 3 Encounters:  03/29/21 112/78  12/27/20 136/72  12/25/20 140/86   Obesity: Wt Readings from Last 3 Encounters:  03/29/21 266 lb 14.4 oz (121.1 kg)  12/27/20 263 lb 14.4 oz (119.7 kg)  12/25/20 266 lb (120.7 kg)   BMI Readings from Last 3 Encounters:  03/29/21 39.99 kg/m  12/27/20 38.97 kg/m  12/25/20 39.86 kg/m     Vaccines:   Shingrix: 63-64 yo and ask insurance if covered when patient above 40 yo Pneumonia: educated and discussed with patient. Flu: educated and discussed with patient.  Hep C Screening: Ordered today STD testing and prevention (HIV/chl/gon/syphilis): 07/25/18 Intimate partner violence: negative Sexual History : not sexually active since 07/2020- she does not want to be checked for STI Menstrual History/LMP/Abnormal Bleeding: s/p hysterectomy  Incontinence Symptoms: no  problems   Breast cancer:  - Last Mammogram: 01/03/21 - BRCA gene screening: N/A  Osteoporosis prevention : Discussed high calcium and vitamin D supplementation, weight bearing exercises  Cervical cancer screening: s/p hysterectomy   Skin cancer: Discussed monitoring for atypical lesions  Colorectal cancer: 03/15/20   Lung cancer: done 01/09/2021 , needs a repeat in 3 months , scheduled for June 2022   Advanced Care Planning: A voluntary discussion about advance care planning including the explanation and discussion of advance directives.  Discussed health care proxy and Living will, and the patient was able to identify a health care proxy as son  Lipids: Lab Results  Component Value Date   CHOL 202 (H) 03/09/2020   CHOL 198 01/14/2019   CHOL 200 (H) 07/25/2018   Lab Results  Component Value Date   HDL 44 (L) 03/09/2020   HDL 56 01/14/2019   HDL 49 (L) 07/25/2018   Lab Results  Component Value Date   LDLCALC 136 (H) 03/09/2020   LDLCALC 117 (H) 01/14/2019   LDLCALC 130 (H) 07/25/2018   Lab Results  Component Value Date   TRIG 112 03/09/2020   TRIG 140 01/14/2019   TRIG 99 07/25/2018   Lab Results  Component Value Date   CHOLHDL 4.6 03/09/2020   CHOLHDL 3.5 01/14/2019   CHOLHDL  4.1 07/25/2018   No results found for: LDLDIRECT  Glucose: Glucose  Date Value Ref Range Status  07/25/2013 102 (H) 65 - 99 mg/dL Final   Glucose, Bld  Date Value Ref Range Status  03/09/2020 86 65 - 99 mg/dL Final    Comment:    .            Fasting reference interval .   09/21/2019 98 70 - 99 mg/dL Final  01/14/2019 87 65 - 99 mg/dL Final    Comment:    .            Fasting reference interval .    Glucose-Capillary  Date Value Ref Range Status  08/19/2019 85 70 - 99 mg/dL Final  12/23/2017 95 65 - 99 mg/dL Final    Patient Active Problem List   Diagnosis Date Noted  . Pain of right breast 12/27/2020  . Multinodular goiter 09/13/2020  . Palpitations 05/13/2020  .  Chest pain of uncertain etiology 05/13/2020  . H/O arthroscopy of right knee 01/14/2019  . Class 2 severe obesity due to excess calories with serious comorbidity and body mass index (BMI) of 36.0 to 36.9 in adult (HCC) 01/14/2019  . Morbid obesity (HCC) 01/14/2019  . Mood swings 01/14/2019  . Hot flushes, perimenopausal 01/14/2019  . Tobacco use disorder 01/14/2019  . Gastroesophageal reflux disease without esophagitis 01/14/2019  . Mixed hyperlipidemia 07/29/2018  . Dizziness 02/06/2018  . COPD (chronic obstructive pulmonary disease) (HCC) 12/20/2017  . Hypertension 12/20/2017  . Food impaction of esophagus     Past Surgical History:  Procedure Laterality Date  . CHOLECYSTECTOMY    . CHONDROPLASTY Right 12/09/2018   Procedure: CHONDROPLASTY LATERAL FEMORAL CONDYLE;  Surgeon: Krasinski, Kevin, MD;  Location: [facility] ORS;  Service: Orthopedics;  Laterality: Right;  . ESOPHAGOGASTRODUODENOSCOPY (EGD) WITH PROPOFOL N/A 08/06/2019   Procedure: ESOPHAGOGASTRODUODENOSCOPY (EGD) WITH PROPOFOL;  Surgeon: Anna, Kiran, MD;  Location: [facility] ENDOSCOPY;  Service: Gastroenterology;  Laterality: N/A;  . FOREIGN BODY REMOVAL N/A 07/15/2016   Procedure: FOREIGN BODY REMOVAL;  Surgeon: Darren Wohl, MD;  Location: [facility] ENDOSCOPY;  Service: Endoscopy;  Laterality: N/A;  . KNEE ARTHROSCOPY Right 12/09/2018   Procedure: ARTHROSCOPY KNEE WITH PARTIAL LATERAL MENISCECTOMY;  Surgeon: Krasinski, Kevin, MD;  Location: [facility] ORS;  Service: Orthopedics;  Laterality: Right;  . TOTAL ABDOMINAL HYSTERECTOMY      Family History  Problem Relation Age of Onset  . Alcohol abuse Mother   . Heart disease Father 50  . Hypertension Father   . Hypertension Brother   . Hyperlipidemia Brother   . Heart disease Brother 52  . Asthma Maternal Grandmother   . Stroke Maternal Grandfather   . Hypertension Maternal Grandfather   . Congestive Heart Failure Paternal Grandmother   . Heart disease Paternal Grandfather   . Congestive  Heart Failure Paternal Grandfather     Social History   Socioeconomic History  . Marital status: Single    Spouse name: Not on file  . Number of children: 3  . Years of education: Not on file  . Highest education level: Associate degree: occupational, technical, or vocational program  Occupational History  . Occupation: CNA  Tobacco Use  . Smoking status: Current Every Day Smoker    Packs/day: 0.50    Years: 31.00    Pack years: 15.50    Types: Cigarettes    Start date: 03/09/1988  . Smokeless tobacco: Never Used  Vaping Use  . Vaping Use: Never used  Substance and Sexual   Activity  . Alcohol use: No  . Drug use: No  . Sexual activity: Yes  Other Topics Concern  . Not on file  Social History Narrative   Lives with father ( takes care of him) older son also at home    Works full time as CNA   Social Determinants of Health   Financial Resource Strain: Medium Risk  . Difficulty of Paying Living Expenses: Somewhat hard  Food Insecurity: No Food Insecurity  . Worried About Running Out of Food in the Last Year: Never true  . Ran Out of Food in the Last Year: Never true  Transportation Needs: No Transportation Needs  . Lack of Transportation (Medical): No  . Lack of Transportation (Non-Medical): No  Physical Activity: Insufficiently Active  . Days of Exercise per Week: 2 days  . Minutes of Exercise per Session: 20 min  Stress: No Stress Concern Present  . Feeling of Stress : Not at all  Social Connections: Moderately Integrated  . Frequency of Communication with Friends and Family: More than three times a week  . Frequency of Social Gatherings with Friends and Family: Three times a week  . Attends Religious Services: More than 4 times per year  . Active Member of Clubs or Organizations: Yes  . Attends Club or Organization Meetings: More than 4 times per year  . Marital Status: Never married  Intimate Partner Violence: Not At Risk  . Fear of Current or Ex-Partner: No  .  Emotionally Abused: No  . Physically Abused: No  . Sexually Abused: No     Current Outpatient Medications:  .  albuterol (VENTOLIN HFA) 108 (90 Base) MCG/ACT inhaler, Inhale 2 puffs into the lungs every 6 (six) hours as needed for wheezing or shortness of breath., Disp: 18 g, Rfl: 1 .  azelastine (ASTELIN) 0.1 % nasal spray, Place 2 sprays into both nostrils 2 (two) times daily. Use in each nostril as directed, Disp: 30 mL, Rfl: 2 .  hydrochlorothiazide (HYDRODIURIL) 25 MG tablet, Take 1 tablet (25 mg total) by mouth daily., Disp: 90 tablet, Rfl: 1 .  loratadine (CLARITIN) 10 MG tablet, Take 10 mg by mouth daily., Disp: , Rfl:  .  meclizine (ANTIVERT) 25 MG tablet, Take 0.5-1 tablets (12.5-25 mg total) by mouth 2 (two) times daily as needed for dizziness., Disp: 100 tablet, Rfl: 0 .  Multiple Vitamin (MULTIVITAMIN) capsule, Take 1 capsule by mouth daily., Disp: , Rfl:  .  omeprazole (PRILOSEC) 40 MG capsule, Take 1 capsule (40 mg total) by mouth daily., Disp: 90 capsule, Rfl: 1 .  rosuvastatin (CRESTOR) 10 MG tablet, Take 1 tablet (10 mg total) by mouth daily., Disp: 90 tablet, Rfl: 1 .  VITAMIN D PO, Take by mouth daily., Disp: , Rfl:  .  Cyanocobalamin (VITAMIN B-12 PO), Take by mouth daily. (Patient not taking: Reported on 03/29/2021), Disp: , Rfl:  .  doxycycline (VIBRA-TABS) 100 MG tablet, Take 1 tablet (100 mg total) by mouth 2 (two) times daily. (Patient not taking: Reported on 03/29/2021), Disp: 14 tablet, Rfl: 0 .  fluticasone (FLONASE) 50 MCG/ACT nasal spray, Place 2 sprays into both nostrils daily. (Patient not taking: Reported on 03/29/2021), Disp: 16 g, Rfl: 0 .  mometasone-formoterol (DULERA) 200-5 MCG/ACT AERO, Inhale 2 puffs into the lungs in the morning and at bedtime. (Patient not taking: Reported on 03/29/2021), Disp: 1 each, Rfl: 2 .  predniSONE (STERAPRED UNI-PAK 21 TAB) 10 MG (21) TBPK tablet, Take by mouth daily. Take 6 tabs   by mouth daily  for 2 days, then 5 tabs for 2 days, then  4 tabs for 2 days, then 3 tabs for 2 days, 2 tabs for 2 days, then 1 tab by mouth daily for 2 days (Patient not taking: Reported on 03/29/2021), Disp: 42 tablet, Rfl: 0 .  promethazine-dextromethorphan (PROMETHAZINE-DM) 6.25-15 MG/5ML syrup, Take 5 mLs by mouth 4 (four) times daily as needed. (Patient not taking: Reported on 03/29/2021), Disp: 118 mL, Rfl: 0  No Known Allergies   ROS  Constitutional: Negative for fever or weight change.  Respiratory: Negative for cough and shortness of breath.   Cardiovascular: Negative for chest pain or palpitations.  Gastrointestinal: Negative for abdominal pain, no bowel changes.  Musculoskeletal: Negative for gait problem or joint swelling.  Skin: Negative for rash.  Neurological: Negative for dizziness or headache.  No other specific complaints in a complete review of systems (except as listed in HPI above).   Objective  Vitals:   03/29/21 1315  BP: 112/78  Pulse: 100  Resp: 18  Temp: 98.3 F (36.8 C)  TempSrc: Oral  SpO2: 98%  Weight: 266 lb 14.4 oz (121.1 kg)  Height: 5' 8.5" (1.74 m)    Body mass index is 39.99 kg/m.  Physical Exam  Constitutional: Patient appears well-developed and well-nourished. No distress.  HENT: Head: Normocephalic and atraumatic. Ears: B TMs ok, no erythema or effusion; Nose: not done Mouth/Throat: not done  Eyes: Conjunctivae and EOM are normal. Pupils are equal, round, and reactive to light. No scleral icterus.  Neck: Normal range of motion. Neck supple. No JVD present. No thyromegaly present.  Cardiovascular: Normal rate, regular rhythm and normal heart sounds.  No murmur heard. No BLE edema. Pulmonary/Chest: Effort normal and breath sounds normal. No respiratory distress. Abdominal: Soft. Bowel sounds are normal, no distension. There is no tenderness. no masses Breast: no lumps or masses, no nipple discharge or rashes FEMALE GENITALIA:  Not done  RECTAL: not done Musculoskeletal: Normal range of motion,  no joint effusions. No gross deformities Neurological: he is alert and oriented to person, place, and time. No cranial nerve deficit. Coordination, balance, strength, speech and gait are normal.  Skin: Skin is warm and dry. No rash noted. No erythema.  Psychiatric: Patient has a normal mood and affect. behavior is normal. Judgment and thought content normal.  Fall Risk: Fall Risk  03/29/2021 12/27/2020 12/07/2020 03/31/2020 03/09/2020  Falls in the past year? 0 0 0 0 0  Number falls in past yr: - 0 0 0 0  Injury with Fall? - 0 0 0 0  Risk for fall due to : - - - - -  Follow up Falls prevention discussed - - - -    Functional Status Survey: Is the patient deaf or have difficulty hearing?: No Does the patient have difficulty seeing, even when wearing glasses/contacts?: No Does the patient have difficulty concentrating, remembering, or making decisions?: No Does the patient have difficulty walking or climbing stairs?: No Does the patient have difficulty dressing or bathing?: No Does the patient have difficulty doing errands alone such as visiting a doctor's office or shopping?: No   Assessment & Plan  1. Well adult exam  - Hepatitis C Antibody - Lipid panel - Hemoglobin A1c - COMPLETE METABOLIC PANEL WITH GFR - CBC with Differential/Platelet - TSH - VITAMIN D 25 Hydroxy (Vit-D Deficiency, Fractures) - B12 and Folate Panel  2. Need for hepatitis C screening test  - Hepatitis C Antibody  3. Need   for shingles vaccine  shingrix   4. Mixed hyperlipidemia  - Lipid panel  5. Diabetes mellitus screening  - Hemoglobin A1c  6. Essential hypertension  - COMPLETE METABOLIC PANEL WITH GFR  7. Multiple thyroid nodules  - TSH  8. Other fatigue  - Hemoglobin A1c - COMPLETE METABOLIC PANEL WITH GFR - CBC with Differential/Platelet - TSH - VITAMIN D 25 Hydroxy (Vit-D Deficiency, Fractures)  9. Tobacco use disorder  - buPROPion (WELLBUTRIN XL) 150 MG 24 hr tablet; Take 1  tablet (150 mg total) by mouth daily.  Dispense: 90 tablet; Refill: 0  -USPSTF grade A and B recommendations reviewed with patient; age-appropriate recommendations, preventive care, screening tests, etc discussed and encouraged; healthy living encouraged; see AVS for patient education given to patient -Discussed importance of 150 minutes of physical activity weekly, eat two servings of fish weekly, eat one serving of tree nuts ( cashews, pistachios, pecans, almonds.Marland Kitchen) every other day, eat 6 servings of fruit/vegetables daily and drink plenty of water and avoid sweet beverages.

## 2021-03-29 ENCOUNTER — Ambulatory Visit (INDEPENDENT_AMBULATORY_CARE_PROVIDER_SITE_OTHER): Payer: 59 | Admitting: Family Medicine

## 2021-03-29 ENCOUNTER — Encounter: Payer: Self-pay | Admitting: Family Medicine

## 2021-03-29 ENCOUNTER — Other Ambulatory Visit: Payer: Self-pay

## 2021-03-29 VITALS — BP 112/78 | HR 100 | Temp 98.3°F | Resp 18 | Ht 68.5 in | Wt 266.9 lb

## 2021-03-29 DIAGNOSIS — Z1159 Encounter for screening for other viral diseases: Secondary | ICD-10-CM | POA: Diagnosis not present

## 2021-03-29 DIAGNOSIS — Z23 Encounter for immunization: Secondary | ICD-10-CM | POA: Diagnosis not present

## 2021-03-29 DIAGNOSIS — Z0000 Encounter for general adult medical examination without abnormal findings: Secondary | ICD-10-CM | POA: Diagnosis not present

## 2021-03-29 DIAGNOSIS — R5383 Other fatigue: Secondary | ICD-10-CM

## 2021-03-29 DIAGNOSIS — E782 Mixed hyperlipidemia: Secondary | ICD-10-CM | POA: Diagnosis not present

## 2021-03-29 DIAGNOSIS — E042 Nontoxic multinodular goiter: Secondary | ICD-10-CM

## 2021-03-29 DIAGNOSIS — F17200 Nicotine dependence, unspecified, uncomplicated: Secondary | ICD-10-CM

## 2021-03-29 DIAGNOSIS — Z131 Encounter for screening for diabetes mellitus: Secondary | ICD-10-CM

## 2021-03-29 DIAGNOSIS — I10 Essential (primary) hypertension: Secondary | ICD-10-CM

## 2021-03-29 MED ORDER — BUPROPION HCL ER (XL) 150 MG PO TB24: 150.0000 mg | ORAL_TABLET | Freq: Every day | ORAL | 0 refills | Status: DC

## 2021-03-29 NOTE — Patient Instructions (Signed)
Preventive Care 84-54 Years Old, Female Preventive care refers to lifestyle choices and visits with your health care provider that can promote health and wellness. This includes:  A yearly physical exam. This is also called an annual wellness visit.  Regular dental and eye exams.  Immunizations.  Screening for certain conditions.  Healthy lifestyle choices, such as: ? Eating a healthy diet. ? Getting regular exercise. ? Not using drugs or products that contain nicotine and tobacco. ? Limiting alcohol use. What can I expect for my preventive care visit? Physical exam Your health care provider will check your:  Height and weight. These may be used to calculate your BMI (body mass index). BMI is a measurement that tells if you are at a healthy weight.  Heart rate and blood pressure.  Body temperature.  Skin for abnormal spots. Counseling Your health care provider may ask you questions about your:  Past medical problems.  Family's medical history.  Alcohol, tobacco, and drug use.  Emotional well-being.  Home life and relationship well-being.  Sexual activity.  Diet, exercise, and sleep habits.  Work and work Statistician.  Access to firearms.  Method of birth control.  Menstrual cycle.  Pregnancy history. What immunizations do I need? Vaccines are usually given at various ages, according to a schedule. Your health care provider will recommend vaccines for you based on your age, medical history, and lifestyle or other factors, such as travel or where you work.   What tests do I need? Blood tests  Lipid and cholesterol levels. These may be checked every 5 years, or more often if you are over 54 years old.  Hepatitis C test.  Hepatitis B test. Screening  Lung cancer screening. You may have this screening every year starting at age 54 if you have a 30-pack-year history of smoking and currently smoke or have quit within the past 15 years.  Colorectal cancer  screening. ? All adults should have this screening starting at age 54 and continuing until age 17. ? Your health care provider may recommend screening at age 54 if you are at increased risk. ? You will have tests every 1-10 years, depending on your results and the type of screening test.  Diabetes screening. ? This is done by checking your blood sugar (glucose) after you have not eaten for a while (fasting). ? You may have this done every 1-3 years.  Mammogram. ? This may be done every 1-2 years. ? Talk with your health care provider about when you should start having regular mammograms. This may depend on whether you have a family history of breast cancer.  BRCA-related cancer screening. This may be done if you have a family history of breast, ovarian, tubal, or peritoneal cancers.  Pelvic exam and Pap test. ? This may be done every 3 years starting at age 54. ? Starting at age 11, this may be done every 5 years if you have a Pap test in combination with an HPV test. Other tests  STD (sexually transmitted disease) testing, if you are at risk.  Bone density scan. This is done to screen for osteoporosis. You may have this scan if you are at high risk for osteoporosis. Talk with your health care provider about your test results, treatment options, and if necessary, the need for more tests. Follow these instructions at home: Eating and drinking  Eat a diet that includes fresh fruits and vegetables, whole grains, lean protein, and low-fat dairy products.  Take vitamin and mineral supplements  as recommended by your health care provider.  Do not drink alcohol if: ? Your health care provider tells you not to drink. ? You are pregnant, may be pregnant, or are planning to become pregnant.  If you drink alcohol: ? Limit how much you have to 0-1 drink a day. ? Be aware of how much alcohol is in your drink. In the [place] one drink equals one 12 oz bottle of beer (355 mL), one 5 oz glass of  wine (148 mL), or one 1 oz glass of hard liquor (44 mL).   Lifestyle  Take daily care of your teeth and gums. Brush your teeth every morning and night with fluoride toothpaste. Floss one time each day.  Stay active. Exercise for at least 30 minutes 5 or more days each week.  Do not use any products that contain nicotine or tobacco, such as cigarettes, e-cigarettes, and chewing tobacco. If you need help quitting, ask your health care provider.  Do not use drugs.  If you are sexually active, practice safe sex. Use a condom or other form of protection to prevent STIs (sexually transmitted infections).  If you do not wish to become pregnant, use a form of birth control. If you plan to become pregnant, see your health care provider for a prepregnancy visit.  If told by your health care provider, take low-dose aspirin daily starting at age 54.  Find healthy ways to cope with stress, such as: ? Meditation, yoga, or listening to music. ? Journaling. ? Talking to a trusted person. ? Spending time with friends and family. Safety  Always wear your seat belt while driving or riding in a vehicle.  Do not drive: ? If you have been drinking alcohol. Do not ride with someone who has been drinking. ? When you are tired or distracted. ? While texting.  Wear a helmet and other protective equipment during sports activities.  If you have firearms in your house, make sure you follow all gun safety procedures. What's next?  Visit your health care provider once a year for an annual wellness visit.  Ask your health care provider how often you should have your eyes and teeth checked.  Stay up to date on all vaccines. This information is not intended to replace advice given to you by your health care provider. Make sure you discuss any questions you have with your health care provider. Document Revised: 08/16/2020 Document Reviewed: 07/24/2018 Elsevier Patient Education  2021 [employer] Inc.  

## 2021-03-30 LAB — TSH: TSH: 0.8 mIU/L

## 2021-03-30 LAB — COMPLETE METABOLIC PANEL WITH GFR
AG Ratio: 1.5 (calc) (ref 1.0–2.5)
ALT: 28 U/L (ref 6–29)
AST: 23 U/L (ref 10–35)
Albumin: 4.4 g/dL (ref 3.6–5.1)
Alkaline phosphatase (APISO): 67 U/L (ref 37–153)
BUN: 17 mg/dL (ref 7–25)
CO2: 28 mmol/L (ref 20–32)
Calcium: 10 mg/dL (ref 8.6–10.4)
Chloride: 105 mmol/L (ref 98–110)
Creat: 0.9 mg/dL (ref 0.50–1.05)
GFR, Est African American: 84 mL/min/{1.73_m2} (ref >=60)
GFR, Est Non African American: 72 mL/min/{1.73_m2} (ref >=60)
Globulin: 3 g/dL (calc) (ref 1.9–3.7)
Glucose, Bld: 85 mg/dL (ref 65–99)
Potassium: 4.1 mmol/L (ref 3.5–5.3)
Sodium: 141 mmol/L (ref 135–146)
Total Bilirubin: 0.4 mg/dL (ref 0.2–1.2)
Total Protein: 7.4 g/dL (ref 6.1–8.1)

## 2021-03-30 LAB — LIPID PANEL
Cholesterol: 237 mg/dL — ABNORMAL HIGH (ref <200)
HDL: 48 mg/dL — ABNORMAL LOW (ref >=50)
LDL Cholesterol (Calc): 158 mg/dL (calc) — ABNORMAL HIGH
Non-HDL Cholesterol (Calc): 189 mg/dL (calc) — ABNORMAL HIGH (ref <130)
Total CHOL/HDL Ratio: 4.9 (calc) (ref <5.0)
Triglycerides: 170 mg/dL — ABNORMAL HIGH (ref <150)

## 2021-03-30 LAB — CBC WITH DIFFERENTIAL/PLATELET
Absolute Monocytes: 433 cells/uL (ref 200–950)
Basophils Absolute: 23 cells/uL (ref 0–200)
Basophils Relative: 0.3 %
Eosinophils Absolute: 129 cells/uL (ref 15–500)
Eosinophils Relative: 1.7 %
HCT: 45.9 % — ABNORMAL HIGH (ref 35.0–45.0)
Hemoglobin: 15.1 g/dL (ref 11.7–15.5)
Lymphs Abs: 3671 cells/uL (ref 850–3900)
MCH: 30.8 pg (ref 27.0–33.0)
MCHC: 32.9 g/dL (ref 32.0–36.0)
MCV: 93.7 fL (ref 80.0–100.0)
MPV: 10.2 fL (ref 7.5–12.5)
Monocytes Relative: 5.7 %
Neutro Abs: 3344 cells/uL (ref 1500–7800)
Neutrophils Relative %: 44 %
Platelets: 329 10*3/uL (ref 140–400)
RBC: 4.9 10*6/uL (ref 3.80–5.10)
RDW: 12.6 % (ref 11.0–15.0)
Total Lymphocyte: 48.3 %
WBC: 7.6 10*3/uL (ref 3.8–10.8)

## 2021-03-30 LAB — HEMOGLOBIN A1C
Hgb A1c MFr Bld: 5.2 % of total Hgb (ref <5.7)
Mean Plasma Glucose: 103 mg/dL
eAG (mmol/L): 5.7 mmol/L

## 2021-03-30 LAB — B12 AND FOLATE PANEL
Folate: 14.4 ng/mL
Vitamin B-12: 769 pg/mL (ref 200–1100)

## 2021-03-30 LAB — HEPATITIS C ANTIBODY
Hepatitis C Ab: NONREACTIVE
SIGNAL TO CUT-OFF: 0.01 (ref <1.00)

## 2021-03-30 LAB — VITAMIN D 25 HYDROXY (VIT D DEFICIENCY, FRACTURES): Vit D, 25-Hydroxy: 22 ng/mL — ABNORMAL LOW (ref 30–100)

## 2021-04-07 ENCOUNTER — Other Ambulatory Visit (HOSPITAL_COMMUNITY): Payer: Self-pay | Admitting: General Surgery

## 2021-04-07 ENCOUNTER — Other Ambulatory Visit: Payer: Self-pay | Admitting: General Surgery

## 2021-04-13 ENCOUNTER — Ambulatory Visit
Admission: RE | Admit: 2021-04-13 | Discharge: 2021-04-13 | Disposition: A | Discharge status: Home / Self Care | Payer: 59 | Source: Ambulatory Visit | Attending: General Surgery | Admitting: General Surgery

## 2021-04-13 ENCOUNTER — Other Ambulatory Visit: Payer: Self-pay

## 2021-04-13 ENCOUNTER — Other Ambulatory Visit: Payer: Self-pay | Admitting: General Surgery

## 2021-04-13 DIAGNOSIS — Z01818 Encounter for other preprocedural examination: Secondary | ICD-10-CM | POA: Diagnosis not present

## 2021-04-16 ENCOUNTER — Other Ambulatory Visit: Payer: Self-pay

## 2021-04-16 ENCOUNTER — Emergency Department
Admission: EM | Admit: 2021-04-16 | Discharge: 2021-04-16 | Disposition: A | Discharge status: Home / Self Care | Payer: 59 | Attending: Emergency Medicine | Admitting: Emergency Medicine

## 2021-04-16 DIAGNOSIS — S99922A Unspecified injury of left foot, initial encounter: Secondary | ICD-10-CM | POA: Diagnosis present

## 2021-04-16 DIAGNOSIS — W228XXA Striking against or struck by other objects, initial encounter: Secondary | ICD-10-CM | POA: Insufficient documentation

## 2021-04-16 DIAGNOSIS — I10 Essential (primary) hypertension: Secondary | ICD-10-CM | POA: Diagnosis not present

## 2021-04-16 DIAGNOSIS — F17210 Nicotine dependence, cigarettes, uncomplicated: Secondary | ICD-10-CM | POA: Diagnosis not present

## 2021-04-16 DIAGNOSIS — S91209A Unspecified open wound of unspecified toe(s) with damage to nail, initial encounter: Secondary | ICD-10-CM

## 2021-04-16 DIAGNOSIS — J449 Chronic obstructive pulmonary disease, unspecified: Secondary | ICD-10-CM | POA: Insufficient documentation

## 2021-04-16 DIAGNOSIS — S91205A Unspecified open wound of left lesser toe(s) with damage to nail, initial encounter: Secondary | ICD-10-CM | POA: Insufficient documentation

## 2021-04-16 DIAGNOSIS — Z79899 Other long term (current) drug therapy: Secondary | ICD-10-CM | POA: Diagnosis not present

## 2021-04-16 MED ORDER — BACITRACIN ZINC 500 UNIT/GM EX OINT
TOPICAL_OINTMENT | Freq: Two times a day (BID) | CUTANEOUS | Status: DC
  Filled 2021-04-16: qty 0.9

## 2021-04-16 NOTE — ED Notes (Signed)
Pt given betadine and saline solution to soak toe

## 2021-04-16 NOTE — Discharge Instructions (Addendum)
Continue to use neosporin as needed. Follow up with podiatry if any concerns arise, or return to ER with any worsening.

## 2021-04-16 NOTE — ED Notes (Signed)
Bacitracin and bandage applied to left 3rd toe. Pt denies any questions, verbalized understanding of all discharge instructions

## 2021-04-16 NOTE — ED Triage Notes (Signed)
Pt presents via POV c/o left 3rd toe pain. Reports hit toe on wagon and toe nail is falling off. Ambulatory to triage.

## 2021-04-19 NOTE — ED Provider Notes (Signed)
[facility] Emergency Department Provider Note  ____________________________________________   Event Date/Time   First MD Initiated Contact with Patient 04/16/21 1843     (approximate)  I have reviewed the triage vital signs and the nursing notes.   HISTORY  Chief Complaint Toe Injury  HPI Joan Davis is a 55 y.o. female who presents to the emergency department for evaluation of left third toenail.  Patient reports that she accidentally hit it into a wheelbarrow when out into the yard.  She reports that the toenail lifted up but she was unable to get it the remaining portion off of her toe.  She denies any pain at the toe itself and reports that it is solely in the nail.  She reports her tetanus is up-to-date.  Denies any other injuries or concerns.         Past Medical History:  Diagnosis Date  . COPD (chronic obstructive pulmonary disease) ([place])   . GERD (gastroesophageal reflux disease)   . Hyperlipidemia   . Hypertension   . Vertigo     Patient Active Problem List   Diagnosis Date Noted  . Pain of right breast 12/27/2020  . Multinodular goiter 09/13/2020  . Palpitations 05/13/2020  . Chest pain of uncertain etiology 84/13/2440  . H/O arthroscopy of right knee 01/14/2019  . Class 2 severe obesity due to excess calories with serious comorbidity and body mass index (BMI) of 36.0 to 36.9 in adult ([place]) 01/14/2019  . Morbid obesity ([place]) 01/14/2019  . Mood swings 01/14/2019  . Hot flushes, perimenopausal 01/14/2019  . Tobacco use disorder 01/14/2019  . Gastroesophageal reflux disease without esophagitis 01/14/2019  . Mixed hyperlipidemia 07/29/2018  . Dizziness 02/06/2018  . COPD (chronic obstructive pulmonary disease) ([place]) 12/20/2017  . Hypertension 12/20/2017  . Food impaction of esophagus     Past Surgical History:  Procedure Laterality Date  . CHOLECYSTECTOMY    . CHONDROPLASTY Right 12/09/2018   Procedure: CHONDROPLASTY  LATERAL FEMORAL CONDYLE;  Surgeon: Thornton Park, MD;  Location: [facility] ORS;  Service: Orthopedics;  Laterality: Right;  . ESOPHAGOGASTRODUODENOSCOPY (EGD) WITH PROPOFOL N/A 08/06/2019   Procedure: ESOPHAGOGASTRODUODENOSCOPY (EGD) WITH PROPOFOL;  Surgeon: Jonathon Bellows, MD;  Location[facility] END[facility]COPY;  Service: Gastroenterology;  Laterality: N/A;  . FOREIGN BODY REMOVAL N/A 07/15/2016   Procedure: FOREIGN BODY REMOVAL;  Surgeon: Lucilla Lame, MD;  Location: [facility] ENDOSCOPY;  Service: Endoscopy;  Laterality: N/A;  . KNEE ARTHROSCOPY Right 12/09/2018   Procedure: ARTHROSCOPY KNEE WITH PARTIAL LATERAL MENISCECTOMY;  Surgeon: Thornton Park, MD;  Location: [facility] ORS;  Service: Orthopedics;  Laterality: Right;  . TOTAL ABDOMINAL HYSTERECTOMY      Prior to Admission medications   Medication Sig Start Date End Date Taking? Authorizing Provider  albuterol (VENTOLIN HFA) 108 (90 Base) MCG/ACT inhaler Inhale 2 puffs into the lungs every 6 (six) hours as needed for wheezing or shortness of breath. 12/27/20   Steele Sizer, MD  azelastine (ASTELIN) 0.1 % nasal spray Place 2 sprays into both nostrils 2 (two) times daily. Use in each nostril as directed 12/27/20   Steele Sizer, MD  buPROPion (WELLBUTRIN XL) 150 MG 24 hr tablet Take 1 tablet (150 mg total) by mouth daily. 03/29/21   Steele Sizer, MD  Cyanocobalamin (VITAMIN B-12 PO) Take by mouth daily. Patient not taking: Reported on 03/29/2021    [provider]  fluticasone (FLONASE) 50 MCG/ACT nasal spray Place 2 sprays into both nostrils daily. Patient not taking: Reported on 03/29/2021 12/27/20  Steele Sizer, MD  hydrochlorothiazide (HYDRODIURIL) 25 MG tablet Take 1 tablet (25 mg total) by mouth daily. 12/27/20   Steele Sizer, MD  loratadine (CLARITIN) 10 MG tablet Take 10 mg by mouth daily.    [provider]  meclizine (ANTIVERT) 25 MG tablet Take 0.5-1 tablets (12.5-25 mg total) by mouth 2 (two) times daily as needed for dizziness. 03/31/20    Steele Sizer, MD  Multiple Vitamin (MULTIVITAMIN) capsule Take 1 capsule by mouth daily.    [provider]  omeprazole (PRILOSEC) 40 MG capsule Take 1 capsule (40 mg total) by mouth daily. 03/09/20   Steele Sizer, MD  rosuvastatin (CRESTOR) 10 MG tablet Take 1 tablet (10 mg total) by mouth daily. 12/27/20   Steele Sizer, MD  VITAMIN D PO Take by mouth daily.    [provider]    Allergies Patient has no known allergies.  Family History  Problem Relation Age of Onset  . Alcohol abuse Mother   . Heart disease Father 5  . Hypertension Father   . Hypertension Brother   . Hyperlipidemia Brother   . Heart disease Brother 48  . Asthma Maternal Grandmother   . Stroke Maternal Grandfather   . Hypertension Maternal Grandfather   . Congestive Heart Failure Paternal Grandmother   . Heart disease Paternal Grandfather   . Congestive Heart Failure Paternal Grandfather     Social History Social History   Tobacco Use  . Smoking status: Current Every Day Smoker    Packs/day: 0.50    Years: 31.00    Pack years: 15.50    Types: Cigarettes    Start date: 03/09/1988  . Smokeless tobacco: Never Used  Vaping Use  . Vaping Use: Never used  Substance Use Topics  . Alcohol use: No  . Drug use: No    Review of Systems Constitutional: No fever/chills Eyes: No visual changes. ENT: No sore throat. Cardiovascular: Denies chest pain. Respiratory: Denies shortness of breath. Gastrointestinal: No abdominal pain.  No nausea, no vomiting.  No diarrhea.  No constipation. Genitourinary: Negative for dysuria. Musculoskeletal: + Pain of left third toe, negative for back pain. Skin: Negative for rash. Neurological: Negative for headaches, focal weakness or numbness.   ____________________________________________   PHYSICAL EXAM:  VITAL SIGNS: ED Triage Vitals  Enc Vitals Group     BP 04/16/21 1658 123/75     Pulse Rate 04/16/21 1658 95     Resp 04/16/21 1658 14      Temp 04/16/21 1658 98.4 F (36.9 C)     Temp Source 04/16/21 1658 Oral     SpO2 04/16/21 1658 97 %     Weight 04/16/21 1659 268 lb (121.6 kg)     Height 04/16/21 1659 5\' 8"  (1.727 m)     Head Circumference --      Peak Flow --      Pain Score 04/16/21 1659 3     Pain Loc --      Pain Edu? --      Excl. in [place]? --    Constitutional: Alert and oriented. Well appearing and in no acute distress. Eyes: Conjunctivae are normal. PERRL. EOMI. Head: Atraumatic. Nose: No congestion/rhinnorhea. Neck: No stridor.   Musculoskeletal: There is no tenderness to palpation of the left toe digits.  The left third toenail is mildly lifted at the distal end, fully attached at the mid substance and proximal portion.  Nail is largely firmly fixed to the nail plate with no evidence of nailbed  laceration. Neurologic:  Normal speech and language. No gross focal neurologic deficits are appreciated.  Skin:  Skin is warm, dry and intact. No rash noted. Psychiatric: Mood and affect are normal. Speech and behavior are normal.  ____________________________________________   INITIAL IMPRESSION / ASSESSMENT AND PLAN / ED COURSE  As part of my medical decision making, I reviewed the following data within the [place] notes reviewed and incorporated and Notes from prior ED visits        Patient is a 55 year old female who presents to the emergency department for evaluation of left third toe pain after hitting it on a wheelbarrow and lifting the nail bed.  See HPI for further details.  On physical exam there is a very mild lift to the distal end, in the mid substance and proximally, the nail appears well attached.  Provided Betadine soak for 15 minutes.  Patient reports that she is up-to-date on tetanus.  Discussed the risk and benefits of nail removal, and mutual decision making was had to decide to leave the nail in place at this time given no evidence of nailbed laceration.  I did provide  the patient with follow-up information for podiatry should she have any difficulties with the nail.  Return precautions were discussed, she stable this time for outpatient management.      ____________________________________________   FINAL CLINICAL IMPRESSION(S) / ED DIAGNOSES  Final diagnoses:  Nail avulsion of toe, initial encounter     ED Discharge Orders    None      *Please note:  Joan Davis was evaluated in Emergency Department on 04/19/2021 for the symptoms described in the history of present illness. She was evaluated in the context of the global COVID-19 pandemic, which necessitated consideration that the patient might be at risk for infection with the SARS-CoV-2 virus that causes COVID-19. Institutional protocols and algorithms that pertain to the evaluation of patients at risk for COVID-19 are in a state of rapid change based on information released by regulatory bodies including the CDC and federal and state organizations. These policies and algorithms were followed during the patient's care in the ED.  Some ED evaluations and interventions may be delayed as a result of limited staffing during and the pandemic.*   Note:  This document was prepared using Dragon voice recognition software and may include unintentional dictation errors.   Marlana Salvage, PA 04/19/21 Ileene Musa    Carrie Mew, MD 04/25/21 [phone]

## 2021-04-25 ENCOUNTER — Telehealth: Payer: Self-pay | Admitting: Emergency Medicine

## 2021-04-25 DIAGNOSIS — E669 Obesity, unspecified: Secondary | ICD-10-CM

## 2021-04-26 NOTE — Telephone Encounter (Signed)
Patient would like a referral from you to [facility]. SHe has been doing the paperwork for the process for Gastric Sleeve

## 2021-04-28 ENCOUNTER — Encounter: Payer: 59 | Attending: General Surgery | Admitting: Dietician

## 2021-04-28 ENCOUNTER — Encounter: Payer: Self-pay | Admitting: Dietician

## 2021-04-28 ENCOUNTER — Other Ambulatory Visit: Payer: Self-pay

## 2021-04-28 VITALS — Ht 68.5 in | Wt 270.1 lb

## 2021-04-28 DIAGNOSIS — Z6841 Body Mass Index (BMI) 40.0 and over, adult: Secondary | ICD-10-CM | POA: Diagnosis not present

## 2021-04-28 NOTE — Patient Instructions (Addendum)
   Include snacks/ light meals every 3-4 hours during the day; have healthy options on hand, especially some protein foods and some low carb veggies, even foods like salsa, raw cut carrots/ cucumber/ celery/ peppers are easy and healthy choices.   Continue with some regular exercise, great job!  Start checking into options for protein shakes/ powders for use after surgery.

## 2021-04-28 NOTE — Progress Notes (Signed)
Nutrition Assessment for Bariatric Surgery   Appointment start time: 8938   end time: 1445  Planned Surgery: Roux-en-Y gastric bypass  Anthropometrics: Weight: 270.1lbs Height: 5'8.5"   BMI: 40.47    Patient's Goal Weight: 180lbs or less  Clinical: Medical History: Hypertension, hyperlipidemia, GERD, chronic pain in back and knees, vitamin D deficiency Medications and Supplements: buPROPion, HCTZ, loratadine, omeprazole, rosuvastatin, vitamin B12, vitamin D3, zinc Relevant labs: 03/29/21 total cholesterol 237, HDL 48, LDL 158, Triglycerides 170, HbA1C 5.2%, vitamin D 22 Notable symptoms: patient reports increased back and knee pain in recent months Drug allergies: none known Food allergies: none known  Lifestyle and Dietary History:  Patient was working on weight loss successfully, lost down to 257 about 1 year ago. Has regained since having COVID 11/2020, and has been unable to lose weight since. She reduced sugary foods and beverages and bread, and increased water, veg and fruits  Back pain has increased with recent weight gain and she is hopeful that weight loss surgery will lead to reduction in pain as well as overall improved health.  Patient cooks meals for her father who has heart disease.   Dieting/ weight history:  Patient reports overweight during childhood/ adolescence   Tried herbalife; "Drink to shrink"  appetite control weight loss drink -- which worked, but had dizziness so stopped  Practices partial-day fasting up to 3 days each week.  Disordered or emotional eating history: no disordered eating; does eat excessively when bored  Physical activity: walking 30-45 minutes, 3 times per week  Dietary Recall:  Daily pattern: 1-2 meals and 1-2 snacks. Dining out: 2-3 meals per week. Breakfast: coffee with powdered creamer;   Snack: occasional nutrigrain bar or peanut butter crackers Lunch: 12-1pm on-the-go -- sandwich with chicken or [place]; pkg peanut butter  crackers; rarely fast food Snack: none or same as am; tries to avoid chips; yogurt and strawberry parfait (sheetz) Supper: up to 1 plate food or less -- protein + veg + starch Snack: fresh fruit ie pineapple Beverages: water, coffee in am, occasional cran grape or cran apple juice   Nutrition Intervention:  Instructed patient on importance of healthy mindset in regards to food and lifestyle changes, and positive support from others close by.   Instructed patient on pre-op diet goals and importance of close adherence to bariatric diet after surgery to avoid side effects and complications.   Discussed stages of the bariatric diet after surgery as well as the importance of adequate protein and fluid intake.   Provided overview of 2-week pre-op diet.   Nutrition Diagnosis: [place]-3.3 Overweight/ obesity related to history of excess calories and inadequate physical activity as evidenced by patient with current BMI of 40.47, following dietary guidelines for weight loss prior to bariatric surgery.  Teaching method(s) utilized: Copy provided: . Pre-op Goals . Diet Stage Template . Smart Snacking handout . Visit summary with goals/ instructions  Learning Readiness: Change in progress  Barriers to learning/ implementing lifestyle change: none  Demonstrated degree of understanding via: Teach Back  Summary:  Patient has begun making diet and lifestyle changes in effort to lose weight and prepare for bariatric surgery. She has tried various diet plans in the past with only limited success.  Patient's goal for weight loss is improved pain and health risk reduction.  She has solid support from family and friends.   She agrees to work on eating at regular intervals and ongoing improvement in food choices, as well as more thorough  chewing of foods prior to surgery.   She voices understanding of diet goals and restrictions and is motivated to follow the  bariatric diet after surgery.   From a nutrition standpoint, she is ready to proceed with the bariatric surgery program and is a good candidate for weight loss surgery.    Plan:  Patient commits to returning for pre-op class at least 2 weeks prior to surgery.   She will plan to return for post-op RD visits beginning 2 weeks after surgery.

## 2021-05-10 ENCOUNTER — Ambulatory Visit
Admission: RE | Admit: 2021-05-10 | Discharge: 2021-05-10 | Disposition: A | Discharge status: Home / Self Care | Payer: 59 | Source: Ambulatory Visit | Attending: Family Medicine | Admitting: Family Medicine

## 2021-05-10 ENCOUNTER — Other Ambulatory Visit: Payer: Self-pay

## 2021-05-10 DIAGNOSIS — R059 Cough, unspecified: Secondary | ICD-10-CM

## 2021-06-02 ENCOUNTER — Ambulatory Visit: Payer: 59 | Admitting: Dietician

## 2021-06-13 ENCOUNTER — Emergency Department: Admission: EM | Admit: 2021-06-13 | Discharge: 2021-06-13 | Discharge status: Against Medical Advice | Payer: 59

## 2021-06-13 NOTE — ED Triage Notes (Signed)
Pt comes into the ED via EMS home, pt states she was eating a piece of steak for lunch and now it is stuck in her throat, pt has a hx of the same in the past. Airway is clear , pt is in nAD at this time.VSS per EMS

## 2021-06-27 ENCOUNTER — Ambulatory Visit: Payer: 59 | Admitting: Family Medicine

## 2021-06-29 NOTE — Progress Notes (Deleted)
Name: Joan Davis   MRN: [id number]    DOB: 14-Jun-1966   Date:06/29/2021       Progress Note  Subjective  Chief Complaint  Follow Up  HPI  COVID-19 infection: diagnosed on 12/07/2020, was feeling better however over the weekend had increase in cough and noticed initially green sputum followed by blood in sputum, CXR showed bronchitis, she was given prednisone and is feeling a little better. She is still taking medication. She is a smoker, discussed checking CT chest . She denies sob but still has nasal congestion and increase in fatigue.   Breast tenderness: she states over the past month she has noticed pain on right upper breast quadrant, no lumps, she needs a mammogram , not sure if needs diagnostic, no rashes.   Hypertension: bp is at goal, she is complaint with bp medication, she denies leg cramps, taking magnesium    Dyslipidemia: she never started statins, her risk is high and discussed the importance of considering medication She states she will take it now and recheck labs during CPE   The 10-year ASCVD risk score Mikey Bussing DC Brooke Bonito., et al., 2013) is: 13.9%   Values used to calculate the score:     Age: 55 years     Sex: Female     Is Non-Hispanic African American: Yes     Diabetic: No     Tobacco smoker: Yes     Systolic Blood Pressure: [id number] mmHg     Is BP treated: Yes     HDL Cholesterol: 44 mg/dL     Total Cholesterol: 202 mg/dL   Morbid obesity: she states she has always been overweight, however after her third pregnancy she started to gain more weight, and her heaviest was 309 lbs a couple of years ago. She states at the time she changed her eating habits - avoiding bread, desserts and also going to the gym daily. She lost down to 268 lbs  She has been drinking more water, eating more fruit and vegetables and avoiding sodas and bread. She took drink to shrink for two months and weight went down from 265 lbs at the time to  257 lbs today is 263.9 lbs. She is now only on a  church fast .    Vertigo: she has a long history of recurrent vertigo. Worse with head movement, associated with nausea but no vomiting. Not associated with headaches. Never seen by ENT. She has intermittent tinnitus on right side only/seems to be a little worse lately  - she also had some lymphadenopathy left side and ear pain but resolved.    COPD: she has been smoking since age 52 , she used to smoke a pack a day, went down to half pack day last year. She has only been using Symbicort prn, however sick since covid and we will have to change to [facility] due to insurance    GERD: she used to take PPI, but stopped a long time , she was seen by Dr. Vicente Males and had EGD done 07/2019 that showed reflux esophagitis, she states at the time she was having constant chest fullness and dysphagia. She has been taking omeprazole a few times a week and seems to help with symptoms    Dysthymia: she has felt down for a numbers of years intermittently, never severe , does not want medication. Unchanged   Multinodular goiter : she had an US done 08/04/2020 and she saw Dr. Honor Junes 08/2020, she needs a FNA . Advised her  to schedule appointment as recommended  Patient Active Problem List   Diagnosis Date Noted   Pain of right breast 12/27/2020   Multinodular goiter 09/13/2020   Palpitations 05/13/2020   Chest pain of uncertain etiology [id number]   H/O arthroscopy of right knee 01/14/2019   Class 2 severe obesity due to excess calories with serious comorbidity and body mass index (BMI) of 36.0 to 36.9 in adult [facility]) 01/14/2019   Morbid obesity ([place]) 01/14/2019   Mood swings 01/14/2019   Hot flushes, perimenopausal 01/14/2019   Tobacco use disorder 01/14/2019   Gastroesophageal reflux disease without esophagitis 01/14/2019   Mixed hyperlipidemia 07/29/2018   Dizziness 02/06/2018   COPD (chronic obstructive pulmonary disease) ([place]) 12/20/2017   Hypertension 12/20/2017   Food impaction of esophagus     Past  Surgical History:  Procedure Laterality Date   CHOLECYSTECTOMY     CHONDROPLASTY Right 12/09/2018   Procedure: CHONDROPLASTY LATERAL FEMORAL CONDYLE;  Surgeon: Thornton Park, MD;  Location: [facility] ORS;  Service: Orthopedics;  Laterality: Right;   ESOPHAGOGASTRODUODENOSCOPY (EGD) WITH PROPOFOL N/A 08/06/2019   Procedure: ESOPHAGOGASTRODUODENOSCOPY (EGD) WITH PROPOFOL;  Surgeon: Jonathon Bellows, MD;  Location[facility] ENDOSCOPY;  Service: Gastroenterology;  Laterality: N/A;   FOREIGN BODY REMOVAL N/A 07/15/2016   Procedure: FOREIGN BODY REMOVAL;  Surgeon: Lucilla Lame, MD;  Location: [facility] ENDOSCOPY;  Service: Endoscopy;  Laterality: N/A;   KNEE ARTHROSCOPY Right 12/09/2018   Procedure: ARTHROSCOPY KNEE WITH PARTIAL LATERAL MENISCECTOMY;  Surgeon: Thornton Park, MD;  Location: [facility] ORS;  Service: Orthopedics;  Laterality: Right;   TOTAL ABDOMINAL HYSTERECTOMY      Family History  Problem Relation Age of Onset   Alcohol abuse Mother    Heart disease Father 21   Hypertension Father    Hypertension Brother    Hyperlipidemia Brother    Heart disease Brother 45   Asthma Maternal Grandmother    Stroke Maternal Grandfather    Hypertension Maternal Grandfather    Congestive Heart Failure Paternal Grandmother    Heart disease Paternal Grandfather    Congestive Heart Failure Paternal Grandfather     Social History   Tobacco Use   Smoking status: Every Day    Packs/day: 0.20    Years: 31.00    Pack years: 6.20    Types: Cigarettes    Start date: 03/09/1988   Smokeless tobacco: Never  Substance Use Topics   Alcohol use: No     Current Outpatient Medications:    albuterol (VENTOLIN HFA) 108 (90 Base) MCG/ACT inhaler, Inhale 2 puffs into the lungs every 6 (six) hours as needed for wheezing or shortness of breath., Disp: 18 g, Rfl: 1   azelastine (ASTELIN) 0.1 % nasal spray, Place 2 sprays into both nostrils 2 (two) times daily. Use in each nostril as directed (Patient not taking: Reported on  04/28/2021), Disp: 30 mL, Rfl: 2   buPROPion (WELLBUTRIN XL) 150 MG 24 hr tablet, Take 1 tablet (150 mg total) by mouth daily., Disp: 90 tablet, Rfl: 0   Cyanocobalamin (VITAMIN B-12 PO), Take by mouth daily., Disp: , Rfl:    fluticasone (FLONASE) 50 MCG/ACT nasal spray, Place 2 sprays into both nostrils daily. (Patient not taking: No sig reported), Disp: 16 g, Rfl: 0   hydrochlorothiazide (HYDRODIURIL) 25 MG tablet, Take 1 tablet (25 mg total) by mouth daily., Disp: 90 tablet, Rfl: 1   loratadine (CLARITIN) 10 MG tablet, Take 10 mg by mouth daily., Disp: , Rfl:    meclizine (ANTIVERT) 25 MG tablet, Take 0.5-1  tablets (12.5-25 mg total) by mouth 2 (two) times daily as needed for dizziness. (Patient not taking: Reported on 04/28/2021), Disp: 100 tablet, Rfl: 0   Multiple Vitamin (MULTIVITAMIN) capsule, Take 1 capsule by mouth daily. (Patient not taking: Reported on 04/28/2021), Disp: , Rfl:    omeprazole (PRILOSEC) 40 MG capsule, Take 1 capsule (40 mg total) by mouth daily., Disp: 90 capsule, Rfl: 1   rosuvastatin (CRESTOR) 10 MG tablet, Take 1 tablet (10 mg total) by mouth daily., Disp: 90 tablet, Rfl: 1   VITAMIN D PO, Take by mouth daily., Disp: , Rfl:    Zinc 50 MG TABS, Take by mouth., Disp: , Rfl:   No Known Allergies  I personally reviewed {Reviewed:[id number]} with the patient/caregiver today.   ROS  ***  Objective  There were no vitals filed for this visit.  There is no height or weight on file to calculate BMI.  Physical Exam ***  No results found for this or any previous visit (from the past 2160 hour(s)).  Diabetic Foot Exam: Diabetic Foot Exam - Simple   No data filed    ***  PHQ2/9: Depression screen [facility] 2/9 04/28/2021 03/29/2021 12/27/2020 03/31/2020 03/09/2020  Decreased Interest 0 0 0 3 2  Down, Depressed, Hopeless 0 0 0 1 0  PHQ - 2 Score 0 0 0 4 2  Altered sleeping - 0 0 1 1  Tired, decreased energy - 2 0 3 2  Change in appetite - 1 0 2 0  Feeling bad or failure about  yourself  - 0 0 0 0  Trouble concentrating - 0 0 2 1  Moving slowly or fidgety/restless - 0 0 0 0  Suicidal thoughts - 0 0 0 0  PHQ-9 Score - 3 0 12 6  Difficult doing work/chores - Not difficult at all - Somewhat difficult Not difficult at all  Some recent data might be hidden    phq 9 is {gen pos [id number] ***  Fall Risk: Fall Risk  04/28/2021 03/29/2021 12/27/2020 12/07/2020 03/31/2020  Falls in the past year? 0 0 0 0 0  Number falls in past yr: - - 0 0 0  Injury with Fall? - - 0 0 0  Risk for fall due to : - - - - -  Follow up - Falls prevention discussed - - -   ***   Functional Status Survey:   ***   Assessment & Plan  *** There are no diagnoses linked to this encounter.

## 2021-06-30 ENCOUNTER — Ambulatory Visit: Payer: 59 | Admitting: Family Medicine

## 2021-06-30 DIAGNOSIS — Z23 Encounter for immunization: Secondary | ICD-10-CM

## 2021-07-06 ENCOUNTER — Telehealth: Payer: Self-pay | Admitting: Family Medicine

## 2021-08-22 ENCOUNTER — Telehealth (INDEPENDENT_AMBULATORY_CARE_PROVIDER_SITE_OTHER): Payer: 59 | Admitting: Nurse Practitioner

## 2021-08-22 ENCOUNTER — Encounter: Payer: Self-pay | Admitting: Nurse Practitioner

## 2021-08-22 DIAGNOSIS — J441 Chronic obstructive pulmonary disease with (acute) exacerbation: Secondary | ICD-10-CM | POA: Diagnosis not present

## 2021-08-22 DIAGNOSIS — K219 Gastro-esophageal reflux disease without esophagitis: Secondary | ICD-10-CM | POA: Diagnosis not present

## 2021-08-22 DIAGNOSIS — J069 Acute upper respiratory infection, unspecified: Secondary | ICD-10-CM

## 2021-08-22 MED ORDER — ALBUTEROL SULFATE HFA 108 (90 BASE) MCG/ACT IN AERS: 2.0000 | INHALATION_SPRAY | Freq: Four times a day (QID) | RESPIRATORY_TRACT | 1 refills | Status: DC | PRN

## 2021-08-22 MED ORDER — MOMETASONE FURO-FORMOTEROL FUM 100-5 MCG/ACT IN AERO: 2.0000 | INHALATION_SPRAY | Freq: Two times a day (BID) | RESPIRATORY_TRACT | 0 refills | Status: DC

## 2021-08-22 MED ORDER — OMEPRAZOLE 40 MG PO CPDR: 40.0000 mg | DELAYED_RELEASE_CAPSULE | Freq: Every day | ORAL | 1 refills | Status: DC

## 2021-08-22 NOTE — Progress Notes (Signed)
Name: Joan Davis   MRN: [id number]    DOB: 12-22-1965   Date:08/22/2021       Progress Note  Subjective  Chief Complaint  Chief Complaint  Patient presents with   Sinusitis    Cough, congestion and body aches.  Onset today    I connected with  Joan Davis  on 08/22/21 at  1:00 PM EDT by a phone enabled telemedicine application and verified that I am speaking with the correct person using two identifiers.  I discussed the limitations of evaluation and management by telemedicine and the availability of in person appointments. The patient expressed understanding and agreed to proceed with a virtual visit  Staff also discussed with the patient that there may be a patient responsible charge related to this service. Patient Location: home Provider Location: [facility] Additional Individuals present: alone  HPI  URI:She says symptoms started yesterday.  She noticed some sinus congestion, headache, body aches and a slight cough.  She says the cough is not bad, she denies any shortness of breath or wheezing.  She has been taking her allergy medication and she has tessalon perls.  She denies fever.  She did take a home covid test and it was negative. Offered Covid PCR test and she declined.  Discussed OTC treatments for symptoms.    COPD: She says that sometimes when she gets sick her COPD can flare up.  She uses albuterol once in awhile.  She says she is out of her prescription.  Will refill albuterol and give prescription for [facility].    GERD: She says she is out of her omeprazole and her reflux has been bothering her.  Will refill prescription.    Patient Active Problem List   Diagnosis Date Noted   Pain of right breast 12/27/2020   Multinodular goiter 09/13/2020   Palpitations 05/13/2020   Chest pain of uncertain etiology 78/29/5621   H/O arthroscopy of right knee 01/14/2019   Class 2 severe obesity due to excess calories with serious comorbidity and body mass index (BMI) of 36.0 to  36.9 in adult [facility]) 01/14/2019   Morbid obesity ([place]) 01/14/2019   Mood swings 01/14/2019   Hot flushes, perimenopausal 01/14/2019   Tobacco use disorder 01/14/2019   Gastroesophageal reflux disease without esophagitis 01/14/2019   Mixed hyperlipidemia 07/29/2018   Dizziness 02/06/2018   COPD (chronic obstructive pulmonary disease) ([place]) 12/20/2017   Hypertension 12/20/2017   Food impaction of esophagus     Social History   Tobacco Use   Smoking status: Every Day    Packs/day: 0.20    Years: 31.00    Pack years: 6.20    Types: Cigarettes    Start date: 03/09/1988   Smokeless tobacco: Never  Substance Use Topics   Alcohol use: No     Current Outpatient Medications:    albuterol (VENTOLIN HFA) 108 (90 Base) MCG/ACT inhaler, Inhale 2 puffs into the lungs every 6 (six) hours as needed for wheezing or shortness of breath., Disp: 18 g, Rfl: 1   azelastine (ASTELIN) 0.1 % nasal spray, Place 2 sprays into both nostrils 2 (two) times daily. Use in each nostril as directed, Disp: 30 mL, Rfl: 2   buPROPion (WELLBUTRIN XL) 150 MG 24 hr tablet, Take 1 tablet (150 mg total) by mouth daily., Disp: 90 tablet, Rfl: 0   Cyanocobalamin (VITAMIN B-12 PO), Take by mouth daily., Disp: , Rfl:    fluticasone (FLONASE) 50 MCG/ACT nasal spray, Place 2 sprays into both nostrils  daily., Disp: 16 g, Rfl: 0   hydrochlorothiazide (HYDRODIURIL) 25 MG tablet, Take 1 tablet (25 mg total) by mouth daily., Disp: 90 tablet, Rfl: 1   loratadine (CLARITIN) 10 MG tablet, Take 10 mg by mouth daily., Disp: , Rfl:    meclizine (ANTIVERT) 25 MG tablet, Take 0.5-1 tablets (12.5-25 mg total) by mouth 2 (two) times daily as needed for dizziness., Disp: 100 tablet, Rfl: 0   Multiple Vitamin (MULTIVITAMIN) capsule, Take 1 capsule by mouth daily., Disp: , Rfl:    omeprazole (PRILOSEC) 40 MG capsule, Take 1 capsule (40 mg total) by mouth daily., Disp: 90 capsule, Rfl: 1   rosuvastatin (CRESTOR) 10 MG tablet, Take 1 tablet (10 mg  total) by mouth daily., Disp: 90 tablet, Rfl: 1   VITAMIN D PO, Take by mouth daily., Disp: , Rfl:    Zinc 50 MG TABS, Take by mouth., Disp: , Rfl:   No Known Allergies  I personally reviewed active problem list, medication list, allergies with the patient/caregiver today.  ROS  Constitutional: Negative for fever or weight change.  Respiratory: Positive for cough, negative for shortness of breath.   Cardiovascular: Negative for chest pain or palpitations.  Gastrointestinal: Negative for abdominal pain, no bowel changes.  Musculoskeletal: Negative for gait problem or joint swelling. Positive for body aches Skin: Negative for rash.  Neurological: Negative for dizziness, positive for headache.  No other specific complaints in a complete review of systems (except as listed in HPI above).   Objective  Virtual encounter, vitals not obtained.  There is no height or weight on file to calculate BMI.  Nursing Note and Vital Signs reviewed.  Physical Exam  Awake, alert and oriented  No results found for this or any previous visit (from the past 20 hour(s)).  Assessment & Plan  1. Viral upper respiratory tract infection -discussed OTC treatments for symptoms -discussed concerning signs and symptoms to seek emergency care  2. Chronic obstructive pulmonary disease with acute exacerbation (HCC)  - mometasone-formoterol (DULERA) 100-5 MCG/ACT AERO; Inhale 2 puffs into the lungs 2 (two) times daily.  Dispense: 13 g; Refill: 0 - albuterol (VENTOLIN HFA) 108 (90 Base) MCG/ACT inhaler; Inhale 2 puffs into the lungs every 6 (six) hours as needed for wheezing or shortness of breath.  Dispense: 18 g; Refill: 1  3. Gastroesophageal reflux disease without esophagitis  - omeprazole (PRILOSEC) 40 MG capsule; Take 1 capsule (40 mg total) by mouth daily.  Dispense: 90 capsule; Refill: 1   -Red flags and when to present for emergency care or RTC including fever >101.26F, chest pain, shortness of  breath, new/worsening/un-resolving symptoms, reviewed with patient at time of visit. Follow up and care instructions discussed and provided in AVS. - I discussed the assessment and treatment plan with the patient. The patient was provided an opportunity to ask questions and all were answered. The patient agreed with the plan and demonstrated an understanding of the instructions.  I provided 25 minutes of non-face-to-face time during this encounter.  Bo Merino, FNP

## 2021-08-23 ENCOUNTER — Encounter: Payer: Self-pay | Admitting: Nurse Practitioner

## 2021-08-23 ENCOUNTER — Other Ambulatory Visit: Payer: Self-pay

## 2021-08-23 DIAGNOSIS — Z20822 Contact with and (suspected) exposure to covid-19: Secondary | ICD-10-CM

## 2021-08-23 NOTE — Progress Notes (Signed)
vid

## 2021-08-24 ENCOUNTER — Telehealth: Payer: 59 | Admitting: Nurse Practitioner

## 2021-08-24 ENCOUNTER — Encounter: Payer: Self-pay | Admitting: Nurse Practitioner

## 2021-08-24 ENCOUNTER — Other Ambulatory Visit: Payer: Self-pay | Admitting: Nurse Practitioner

## 2021-08-24 ENCOUNTER — Telehealth: Payer: Self-pay

## 2021-08-24 ENCOUNTER — Other Ambulatory Visit: Payer: Self-pay

## 2021-08-24 DIAGNOSIS — J441 Chronic obstructive pulmonary disease with (acute) exacerbation: Secondary | ICD-10-CM

## 2021-08-24 LAB — SARS-COV-2, NAA 2 DAY TAT

## 2021-08-24 LAB — NOVEL CORONAVIRUS, NAA: SARS-CoV-2, NAA: NOT DETECTED

## 2021-08-24 MED ORDER — PREDNISONE 10 MG PO TABS
10.0000 mg | ORAL_TABLET | ORAL | 0 refills | Status: DC
Start: 2021-08-24 — End: 2021-11-28

## 2021-08-24 NOTE — Telephone Encounter (Signed)
Pt.notified

## 2021-08-24 NOTE — Telephone Encounter (Signed)
Pt is still having severe cough and she feels like its moving into her bronchical tube. Pt would like something called into [facility] on [address] [place]

## 2021-08-25 NOTE — Progress Notes (Signed)
Patient was not seen, she cancelled appointment.

## 2021-11-28 ENCOUNTER — Other Ambulatory Visit: Payer: Self-pay

## 2021-11-28 ENCOUNTER — Ambulatory Visit: Payer: No Typology Code available for payment source | Admitting: Podiatry

## 2021-11-28 ENCOUNTER — Encounter: Payer: Self-pay | Admitting: Podiatry

## 2021-11-28 DIAGNOSIS — L600 Ingrowing nail: Secondary | ICD-10-CM

## 2021-11-28 DIAGNOSIS — Z79899 Other long term (current) drug therapy: Secondary | ICD-10-CM

## 2021-11-28 MED ORDER — TERBINAFINE HCL 250 MG PO TABS: 250.0000 mg | ORAL_TABLET | Freq: Every day | ORAL | 0 refills | Status: DC

## 2021-11-28 MED ORDER — DOXYCYCLINE HYCLATE 100 MG PO TABS: 100.0000 mg | ORAL_TABLET | Freq: Two times a day (BID) | ORAL | 0 refills | Status: DC

## 2021-11-28 MED ORDER — GENTAMICIN SULFATE 0.1 % EX CREA: 1.0000 "application " | TOPICAL_CREAM | Freq: Two times a day (BID) | CUTANEOUS | 1 refills | Status: DC

## 2021-11-28 NOTE — Addendum Note (Signed)
Addended by: Edrick Kins on: 11/28/2021 11:00 AM   Modules accepted: Orders

## 2021-11-28 NOTE — Progress Notes (Addendum)
° °  Subjective: Patient presents today for evaluation of pain to the medial and lateral border of the bilateral great toes for several years now. Patient is concerned for possible ingrown nail.  It is very sensitive to touch.  Patient presents today for further treatment and evaluation.  Past Medical History:  Diagnosis Date   COPD (chronic obstructive pulmonary disease) (HCC)    GERD (gastroesophageal reflux disease)    Hyperlipidemia    Hypertension    Vertigo     Objective:  General: Well developed, nourished, in no acute distress, alert and oriented x3   Dermatology: Skin is warm, dry and supple bilateral.  Medial and lateral border of the bilateral great toes appears to be erythematous with evidence of an ingrowing nail. Pain on palpation noted to the border of the nail fold.  Hyperkeratotic dystrophic discolored nails noted 1-5 bilateral  Vascular: Dorsalis Pedis artery and Posterior Tibial artery pedal pulses palpable. No lower extremity edema noted.   Neruologic: Grossly intact via light touch bilateral.  Musculoskeletal: Muscular strength within normal limits in all groups bilateral. Normal range of motion noted to all pedal and ankle joints.   Assesement: #1 Paronychia with ingrowing nail medial and lateral border bilateral great toes #2  Onychomycosis of toenails bilateral  Plan of Care:  1. Patient evaluated.  2. Discussed treatment alternatives and plan of care. Explained nail avulsion procedure and post procedure course to patient. 3. Patient opted for permanent partial nail avulsion of the ingrown portion of the nail.  4. Prior to procedure, local anesthesia infiltration utilized using 3 ml of a 50:50 mixture of 2% plain lidocaine and 0.5% plain marcaine in a normal hallux block fashion and a betadine prep performed.  5. Partial permanent nail avulsion with chemical matrixectomy performed using [id number] applications of phenol followed by alcohol flush.  6. Light  dressing applied.  Post care instructions provided 7.  Prescription for gentamicin 2% cream  8.  Prescription for doxycycline 100 mg 2 times daily #20 for prophylaxis  9.  Today we discussed different treatment options including oral, topical, and laser antifungal treatment modalities.  Patient opts for oral antifungal treatment.  Prescription for Lamisil 250 mg #90 daily  10.  Order placed for hepatic function panel  11.  Return to clinic 3 weeks.  *[facility]  Edrick Kins, DPM [facility]  Dr. Edrick Kins, DPM    [address], [place] [address]                Office [phone]  Fax [phone]

## 2021-11-28 NOTE — Patient Instructions (Signed)

## 2021-11-29 LAB — HEPATIC FUNCTION PANEL
ALT: 20 IU/L (ref 0–32)
AST: 21 IU/L (ref 0–40)
Albumin: 4.5 g/dL (ref 3.8–4.9)
Alkaline Phosphatase: 85 IU/L (ref 44–121)
Bilirubin Total: 0.3 mg/dL (ref 0.0–1.2)
Bilirubin, Direct: <0.1 mg/dL (ref 0.00–0.40)
Total Protein: 7.1 g/dL (ref 6.0–8.5)

## 2021-11-30 ENCOUNTER — Telehealth: Payer: Self-pay | Admitting: *Deleted

## 2021-11-30 NOTE — Telephone Encounter (Signed)
"  I was in on Tuesday to have ingrowns cut out of my toenails.  I'm having a lot pain with the right toe and it's awful red.  I didn't know if this is normal.  If you can, give me a call back."

## 2021-12-01 NOTE — Telephone Encounter (Signed)
Continue soaking daily.  Continue elevation and rest.  Ibuprofen and Tylenol as needed.  Please explained to the patient and inform the patient that pain and tenderness after surgery is normal.  Redness is likely from the phenol reaction.  This should improve over the weekend.  Thanks, Dr. Amalia Hailey

## 2021-12-04 NOTE — Telephone Encounter (Signed)
I attempted to return her call.  I left her a message asking her to give me a call back.  I need to give her Dr. Phoebe Perch response, which is as follows.  Continue soaking daily.  Continue elevation and rest.  Ibuprofen and Tylenol as needed.  Please explained to the patient and inform the patient that pain and tenderness after surgery is normal.  Redness is likely from the phenol reaction.  This should improve over the weekend

## 2021-12-06 NOTE — Telephone Encounter (Signed)
I attempted to call the patient again.  I left her a message to call me back.

## 2021-12-08 ENCOUNTER — Ambulatory Visit: Payer: No Typology Code available for payment source | Admitting: Podiatry

## 2021-12-15 ENCOUNTER — Telehealth: Payer: Self-pay | Admitting: Emergency Medicine

## 2021-12-15 NOTE — Telephone Encounter (Signed)
Patient would like to know if she can get a letter stating she cannot go into isolation with covid patients at a nursing home due to her history of asthma and respiratory issues

## 2021-12-15 NOTE — Telephone Encounter (Signed)
Left message for patient

## 2021-12-19 ENCOUNTER — Ambulatory Visit: Payer: No Typology Code available for payment source | Admitting: Podiatry

## 2021-12-19 ENCOUNTER — Encounter: Payer: Self-pay | Admitting: Podiatry

## 2021-12-19 ENCOUNTER — Other Ambulatory Visit: Payer: Self-pay

## 2021-12-19 DIAGNOSIS — L600 Ingrowing nail: Secondary | ICD-10-CM

## 2021-12-19 NOTE — Progress Notes (Signed)
° °  Subjective: 56 y.o. female presents today status post permanent nail avulsion procedure of the medial and lateral borders of the bilateral great toes that was performed on 11/28/2021. Patient states that her left great toe is doing well but she continues to have some residual pain to the right great toe.  She has been soaking her foot and applying antibiotic cream as instructed.  She also completed the oral doxycycline as prescribed.  No new complaints at this time  Past Medical History:  Diagnosis Date   COPD (chronic obstructive pulmonary disease) (HCC)    GERD (gastroesophageal reflux disease)    Hyperlipidemia    Hypertension    Vertigo     Objective: Skin is warm, dry and supple. Nail and respective nail fold appears to be healing appropriately. Open wound to the associated nail fold with a granular wound base and moderate amount of fibrotic tissue. Minimal drainage noted. Mild erythema around the periungual region likely due to phenol chemical matricectomy.  Assessment: #1 s/p partial permanent nail matrixectomy medial and lateral border of the bilateral great toes   Plan of care: #1 patient was evaluated  #2 light debridement of open wound was performed to the periungual border of the respective toe using a currette. Antibiotic ointment and Band-Aid was applied. #3  Continue soaking and gentamicin cream daily for an additional week  #4 patient is to return to clinic on a PRN basis.   Edrick Kins, DPM [facility]  Dr. Edrick Kins, DPM    [address], [place] [address]                Office [phone]  Fax [phone]

## 2021-12-27 ENCOUNTER — Other Ambulatory Visit: Payer: Self-pay | Admitting: Family Medicine

## 2021-12-27 DIAGNOSIS — I10 Essential (primary) hypertension: Secondary | ICD-10-CM

## 2022-01-31 ENCOUNTER — Encounter: Payer: Self-pay | Admitting: Nurse Practitioner

## 2022-01-31 ENCOUNTER — Telehealth (INDEPENDENT_AMBULATORY_CARE_PROVIDER_SITE_OTHER): Payer: No Typology Code available for payment source | Admitting: Nurse Practitioner

## 2022-01-31 DIAGNOSIS — J069 Acute upper respiratory infection, unspecified: Secondary | ICD-10-CM

## 2022-01-31 DIAGNOSIS — J441 Chronic obstructive pulmonary disease with (acute) exacerbation: Secondary | ICD-10-CM

## 2022-01-31 MED ORDER — PREDNISONE 10 MG (21) PO TBPK: ORAL_TABLET | ORAL | 0 refills | Status: DC

## 2022-01-31 MED ORDER — BENZONATATE 100 MG PO CAPS: 200.0000 mg | ORAL_CAPSULE | Freq: Two times a day (BID) | ORAL | 0 refills | Status: DC | PRN

## 2022-01-31 MED ORDER — ALBUTEROL SULFATE HFA 108 (90 BASE) MCG/ACT IN AERS: 2.0000 | INHALATION_SPRAY | Freq: Four times a day (QID) | RESPIRATORY_TRACT | 1 refills | Status: DC | PRN

## 2022-01-31 NOTE — Progress Notes (Signed)
? ?Name: Joan Davis   MRN: [id number]    DOB: [birth year]   Date:01/31/2022 ? ?     Progress Note ? ?Subjective ? ?Chief Complaint ? ?Chief Complaint  ?Patient presents with  ? Cough  ?  Coughing brown/reddish phlegm and congestion  ? ? ?I connected with  Joan Davis  on 01/31/22 at 09:00 am by a telephone enabled telemedicine application and verified that I am speaking with the correct person using two identifiers.  I discussed the limitations of evaluation and management by telemedicine and the availability of in person appointments. The patient expressed understanding and agreed to proceed with a virtual visit  Staff also discussed with the patient that there may be a patient responsible charge related to this service. ?Patient Location: home ?Provider Location: [facility] ?Additional Individuals present: alone ? ?HPI ? ?URI: She says her symptoms started Friday with nasal congestion and cough. She denies any fever. She says she is not really short of breath but feels like she is taking more breaths than usual. She has been taking her flonase and claritin.  Discussed OTC treatments for symptoms.  She is outside the window for treatment for Covid and Flu, deferred testing. Will send in prescription for tessalon perls.   ? ?COPD: She says that she is using her albuterol inhaler more often and she needs a refill. Will send in refill.  She says she is not quite short of breath but feels like she is breathing heavier than usual. Will treat COPD exacerbation, prescribing course of steroids and giving her a sample of Stiolto Respimat for daily treatment. Discussed if there is no improvement over the next few days to let me know and we will get a chest xray.  She is agreeable to plan.  ? ?Patient Active Problem List  ? Diagnosis Date Noted  ? Pain of right breast 12/27/2020  ? Multinodular goiter 09/13/2020  ? Palpitations 05/13/2020  ? Chest pain of uncertain etiology 52/84/1324  ? H/O arthroscopy of right knee  01/14/2019  ? Class 2 severe obesity due to excess calories with serious comorbidity and body mass index (BMI) of 36.0 to 36.9 in adult [facility]) 01/14/2019  ? Morbid obesity ([place]) 01/14/2019  ? Mood swings 01/14/2019  ? Hot flushes, perimenopausal 01/14/2019  ? Tobacco use disorder 01/14/2019  ? Gastroesophageal reflux disease without esophagitis 01/14/2019  ? Mixed hyperlipidemia 07/29/2018  ? Dizziness 02/06/2018  ? COPD (chronic obstructive pulmonary disease) ([place]) 12/20/2017  ? Hypertension 12/20/2017  ? Food impaction of esophagus   ? ? ?Social History  ? ?Tobacco Use  ? Smoking status: Every Day  ?  Packs/day: 0.20  ?  Years: 31.00  ?  Pack years: 6.20  ?  Types: Cigarettes  ?  Start date: 03/09/1988  ? Smokeless tobacco: Never  ?Substance Use Topics  ? Alcohol use: No  ? ? ? ?Current Outpatient Medications:  ?  azelastine (ASTELIN) 0.1 % nasal spray, Place 2 sprays into both nostrils 2 (two) times daily. Use in each nostril as directed, Disp: 30 mL, Rfl: 2 ?  Cyanocobalamin (VITAMIN B-12 PO), Take by mouth daily., Disp: , Rfl:  ?  doxycycline (VIBRA-TABS) 100 MG tablet, Take 1 tablet (100 mg total) by mouth 2 (two) times daily., Disp: 20 tablet, Rfl: 0 ?  fluticasone (FLONASE) 50 MCG/ACT nasal spray, Place 2 sprays into both nostrils daily., Disp: 16 g, Rfl: 0 ?  gentamicin cream (GARAMYCIN) 0.1 %, Apply 1 application topically 2 (two) times  daily., Disp: 30 g, Rfl: 1 ?  hydrochlorothiazide (HYDRODIURIL) 25 MG tablet, TAKE 1 TABLET(25 MG) BY MOUTH DAILY, Disp: 90 tablet, Rfl: 0 ?  loratadine (CLARITIN) 10 MG tablet, Take 10 mg by mouth daily., Disp: , Rfl:  ?  meclizine (ANTIVERT) 25 MG tablet, Take 0.5-1 tablets (12.5-25 mg total) by mouth 2 (two) times daily as needed for dizziness., Disp: 100 tablet, Rfl: 0 ?  Multiple Vitamin (MULTIVITAMIN) capsule, Take 1 capsule by mouth daily., Disp: , Rfl:  ?  omeprazole (PRILOSEC) 40 MG capsule, Take 1 capsule (40 mg total) by mouth daily., Disp: 90 capsule, Rfl: 1 ?   rosuvastatin (CRESTOR) 10 MG tablet, Take 1 tablet (10 mg total) by mouth daily., Disp: 90 tablet, Rfl: 1 ?  terbinafine (LAMISIL) 250 MG tablet, Take 1 tablet (250 mg total) by mouth daily., Disp: 90 tablet, Rfl: 0 ?  VITAMIN D PO, Take by mouth daily., Disp: , Rfl:  ?  Zinc 50 MG TABS, Take by mouth., Disp: , Rfl:  ?  albuterol (VENTOLIN HFA) 108 (90 Base) MCG/ACT inhaler, Inhale 2 puffs into the lungs every 6 (six) hours as needed for wheezing or shortness of breath., Disp: 18 g, Rfl: 1 ?  benzonatate (TESSALON) 100 MG capsule, Take 2 capsules (200 mg total) by mouth 2 (two) times daily as needed for cough., Disp: 20 capsule, Rfl: 0 ? ?No Known Allergies ? ?I personally reviewed active problem list, medication list, allergies, notes from last encounter with the patient/caregiver today. ? ?ROS ? ?Constitutional: Negative for fever or weight change.  ?HEENT: positive for nasal congestion ?Respiratory: Positive for cough and shortness of breath.   ?Cardiovascular: Negative for chest pain or palpitations.  ?Gastrointestinal: Negative for abdominal pain, no bowel changes.  ?Musculoskeletal: Negative for gait problem or joint swelling.  ?Skin: Negative for rash.  ?Neurological: Negative for dizziness or headache.  ?No other specific complaints in a complete review of systems (except as listed in HPI above).  ? ?Objective ? ?Virtual encounter, vitals not obtained. ? ?There is no height or weight on file to calculate BMI. ? ?Nursing Note and Vital Signs reviewed. ? ?Physical Exam ? ?Awake, alert and oriented, speaking in complete sentences. ? ?No results found for this or any previous visit (from the past 72 hour(s)). ? ?Assessment & Plan ? ?1. Viral upper respiratory tract infection ?-push fluids ?-continue to use Claritin and Flonase, can add mucinex  ?- benzonatate (TESSALON) 100 MG capsule; Take 2 capsules (200 mg total) by mouth 2 (two) times daily as needed for cough.  Dispense: 20 capsule; Refill: 0 ? ?2. Chronic  obstructive pulmonary disease with acute exacerbation ([place]) ? ?- albuterol (VENTOLIN HFA) 108 (90 Base) MCG/ACT inhaler; Inhale 2 puffs into the lungs every 6 (six) hours as needed for wheezing or shortness of breath.  Dispense: 18 g; Refill: 1 ?- predniSONE (STERAPRED UNI-PAK 21 TAB) 10 MG (21) TBPK tablet; Take as directed on package.  (60 mg po on day 1, 50 mg po on day 2...)  Dispense: 21 tablet; Refill: 0  ? ? ?-Red flags and when to present for emergency care or RTC including fever >101.71F, chest pain, shortness of breath, new/worsening/un-resolving symptoms,  reviewed with patient at time of visit. Follow up and care instructions discussed and provided in AVS. ?- I discussed the assessment and treatment plan with the patient. The patient was provided an opportunity to ask questions and all were answered. The patient agreed with the plan and demonstrated an  understanding of the instructions. ? ?I provided 20 minutes of non-face-to-face time during this encounter. ? ?Bo Merino, FNP ? ?  ?

## 2022-02-02 IMAGING — US US THYROID
1 series · 12 of 25 positions shown · non-contrast
Comparison: None.

CLINICAL DATA: Multiple thyroid nodule seen on recent carotid
artery ultrasound.

EXAM:
THYROID ULTRASOUND
TECHNIQUE: Ultrasound examination of the thyroid gland and adjacent soft
tissues was performed.

[Series 1: us thyroid · 0.07mm/px · 66 acquisitions, 12 frames shown]
[im 3/66]
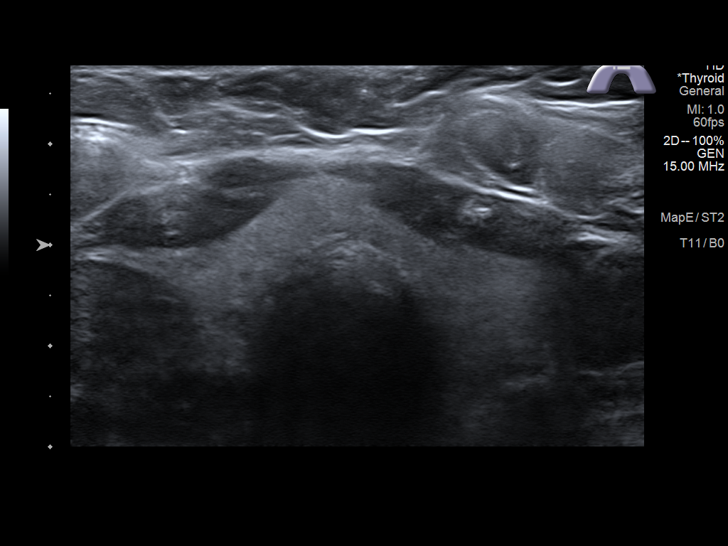
[im 9/66]
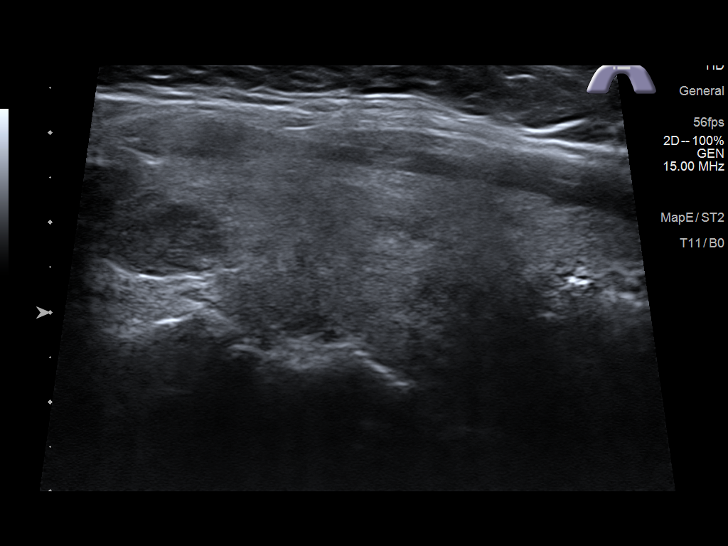
[im 14/66]
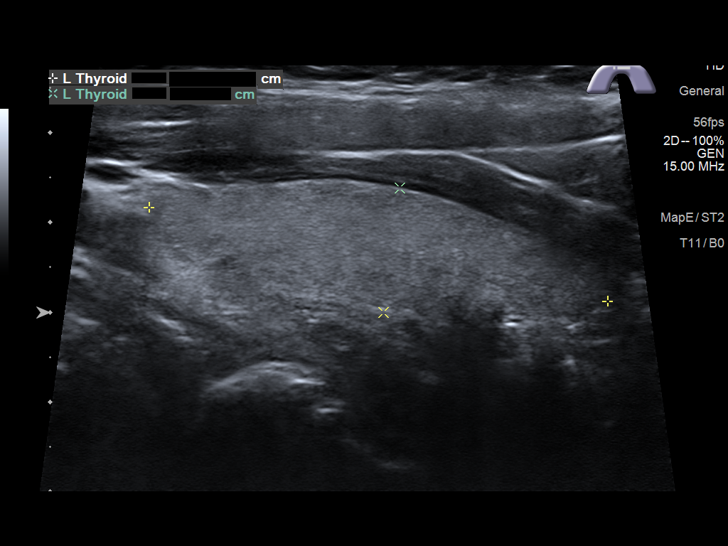
[im 19/66]
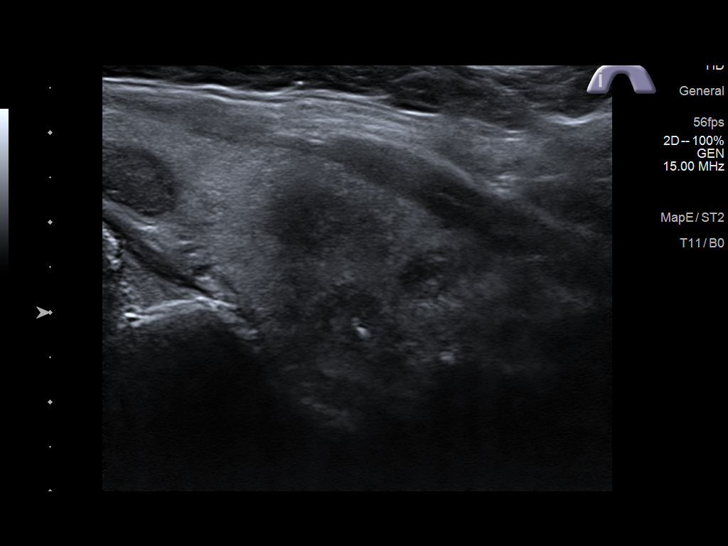
[im 25/66]
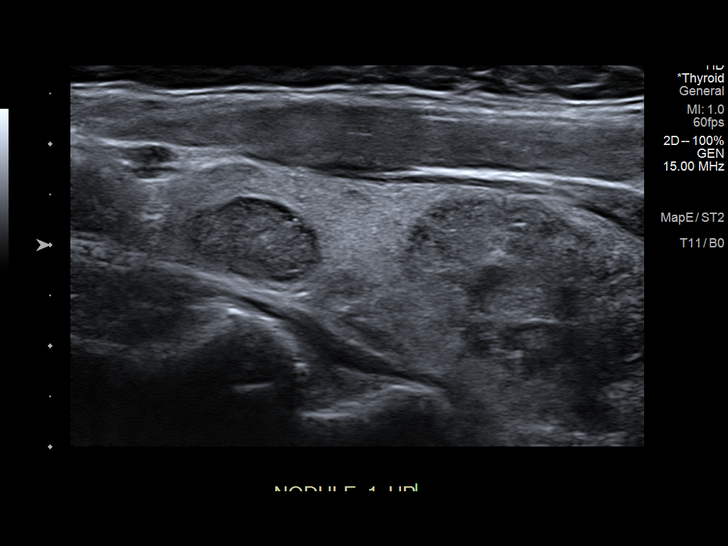
[im 30/66]
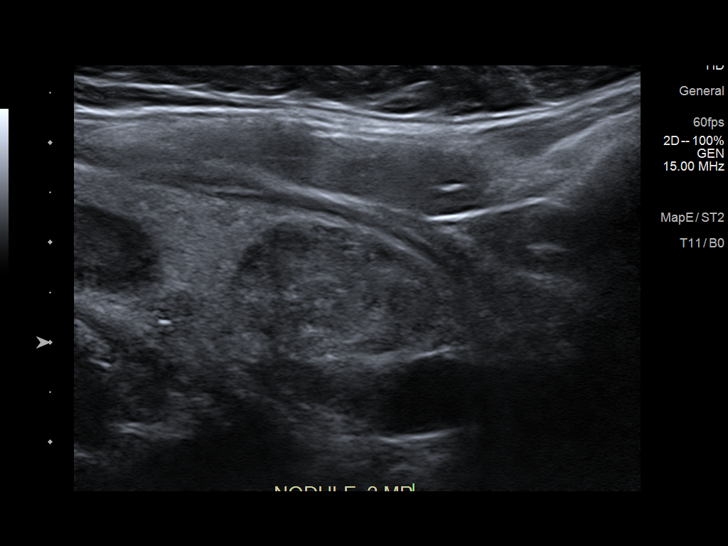
[im 36/66]
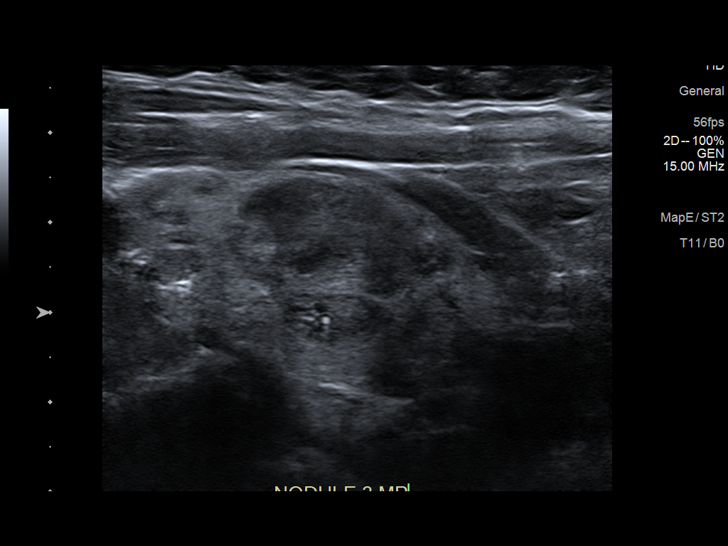
[im 41/66]
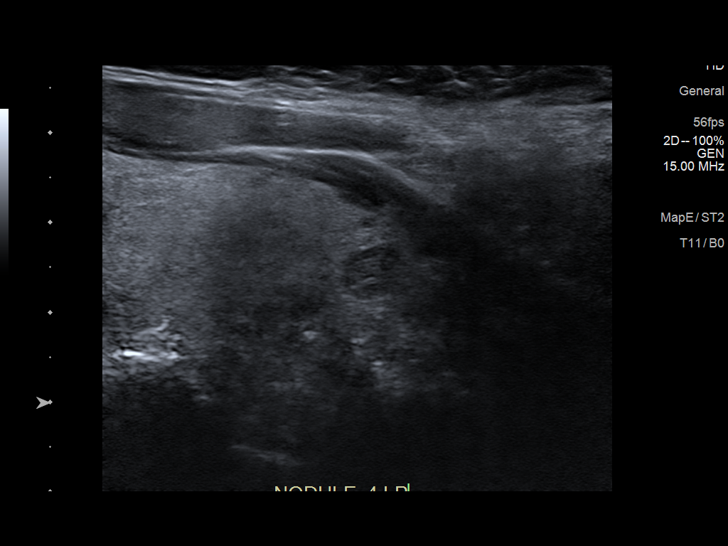
[im 47/66]
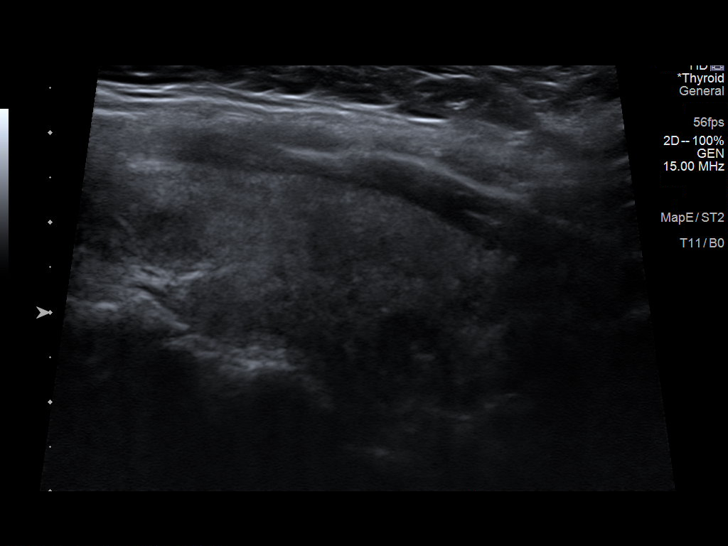
[im 52/66]
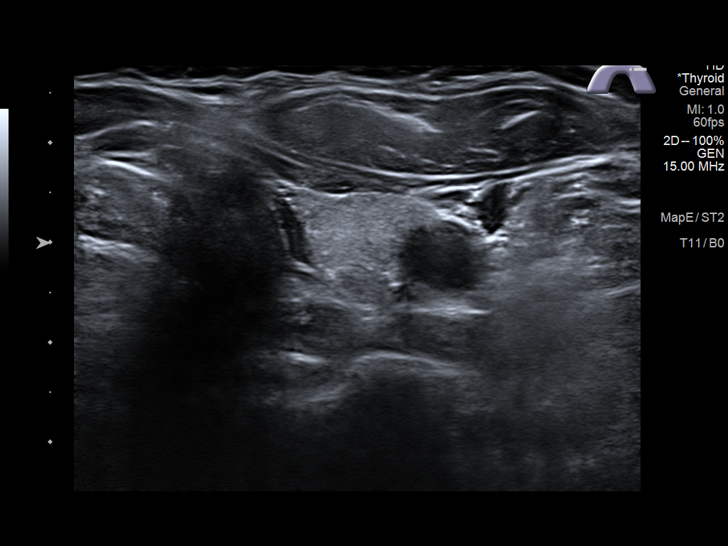
[im 57/66]
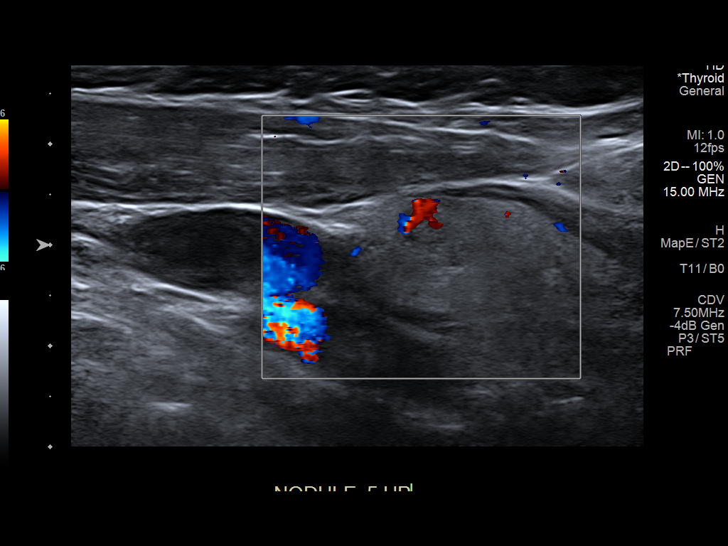
[im 63/66]
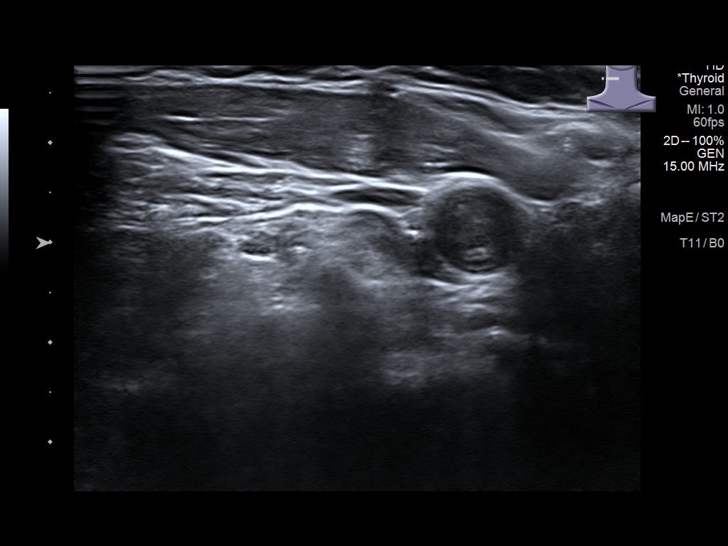

[12 of 25 positions shown; findings below may reference images not displayed]

FINDINGS: Parenchymal Echotexture: Mildly heterogenous

Isthmus: 0.6 cm

Right lobe: 6.6 x 2.3 x 2.6 cm

Left lobe: There is 5.2 x 1.4 x 1.7 cm

_________________________________________________________

Estimated total number of nodules >/= 1 cm: 5

Number of spongiform nodules >/=  2 cm not described below (TR1): 0

Number of mixed cystic and solid nodules >/= 1.5 cm not described
below (TR2): 0

_________________________________________________________

Nodule # 1:

Location: Right; Superior

Maximum size: 1.3 cm; Other 2 dimensions: 1.1 x 0.8 cm

Composition: solid/almost completely solid (2)

Echogenicity: hypoechoic (2)

Shape: not taller-than-wide (0)

Margins: smooth (0)

Echogenic foci: punctate echogenic foci (3)

ACR TI-RADS total points: 7.

ACR TI-RADS risk category: TR5 (>/= 7 points).

ACR TI-RADS recommendations:

**Given size (>/= 1.0 cm) and appearance, fine needle aspiration of
this highly suspicious nodule should be considered based on TI-RADS
criteria.

_________________________________________________________

Nodule # 2:

Location: Right; Mid

Maximum size: 2.1 cm; Other 2 dimensions: 1.7 x 1.3 cm

Composition: solid/almost completely solid (2)

Echogenicity: hypoechoic (2)

Shape: not taller-than-wide (0)

Margins: ill-defined (0)

Echogenic foci: none (0)

ACR TI-RADS total points: 4.

ACR TI-RADS risk category: TR4 (4-6 points).

ACR TI-RADS recommendations:

**Given size (>/= 1.5 cm) and appearance, fine needle aspiration of
this moderately suspicious nodule should be considered based on
TI-RADS criteria.

_________________________________________________________

Nodule # 3:

Location: Right; Inferior

Maximum size: 1.2 cm; Other 2 dimensions: 0.9 x 0.5 cm

Composition: solid/almost completely solid (2)

Echogenicity: hypoechoic (2)

Shape: not taller-than-wide (0)

Margins: ill-defined (0)

Echogenic foci: macrocalcifications (1)

ACR TI-RADS total points: 5.

ACR TI-RADS risk category: TR4 (4-6 points).

ACR TI-RADS recommendations:

*Given size (>/= 1 - 1.4 cm) and appearance, a follow-up ultrasound
in 1 year should be considered based on TI-RADS criteria.

_________________________________________________________

The technologist measured a indistinct 1.2 cm area in the right
inferior thyroid gland. This is not clearly a distinct thyroid
nodule. Attention on follow-up examinations is recommended.

There is a isoechoic 1 cm thyroid nodule in the left superior
thyroid gland that does not require follow-up.
IMPRESSION: 1. Multinodular goiter as detailed above.
2. There is a 1.3 cm TR5 thyroid nodule in the right superior
thyroid gland. Fine-needle aspiration is recommended for this
thyroid nodule.
3. There is a 2.1 cm moderately suspicious TR4 thyroid nodule in the
right mid thyroid gland. Fine-needle aspiration is recommended for
this thyroid nodule.
4. There is a 1.2 cm moderately suspicious thyroid nodule in the
right inferior thyroid gland (labeled 3). A 1 year follow-up
ultrasound is recommended for this thyroid nodule.

The above is in keeping with the ACR TI-RADS recommendations - [HOSPITAL] 6092;[DATE].

## 2022-02-06 ENCOUNTER — Other Ambulatory Visit: Payer: Self-pay

## 2022-02-06 ENCOUNTER — Encounter: Payer: Self-pay | Admitting: Family Medicine

## 2022-02-06 ENCOUNTER — Ambulatory Visit (INDEPENDENT_AMBULATORY_CARE_PROVIDER_SITE_OTHER): Payer: No Typology Code available for payment source | Admitting: Family Medicine

## 2022-02-06 DIAGNOSIS — Z23 Encounter for immunization: Secondary | ICD-10-CM

## 2022-02-06 DIAGNOSIS — Z1231 Encounter for screening mammogram for malignant neoplasm of breast: Secondary | ICD-10-CM

## 2022-02-06 DIAGNOSIS — J3089 Other allergic rhinitis: Secondary | ICD-10-CM

## 2022-02-06 DIAGNOSIS — I10 Essential (primary) hypertension: Secondary | ICD-10-CM

## 2022-02-06 DIAGNOSIS — J302 Other seasonal allergic rhinitis: Secondary | ICD-10-CM

## 2022-02-06 DIAGNOSIS — J410 Simple chronic bronchitis: Secondary | ICD-10-CM

## 2022-02-06 DIAGNOSIS — K219 Gastro-esophageal reflux disease without esophagitis: Secondary | ICD-10-CM

## 2022-02-06 DIAGNOSIS — E042 Nontoxic multinodular goiter: Secondary | ICD-10-CM

## 2022-02-06 MED ORDER — WEGOVY 0.25 MG/0.5ML ~~LOC~~ SOAJ: 0.2500 mg | SUBCUTANEOUS | 0 refills | Status: DC

## 2022-02-06 MED ORDER — OMEPRAZOLE 40 MG PO CPDR: 40.0000 mg | DELAYED_RELEASE_CAPSULE | Freq: Every day | ORAL | 1 refills | Status: DC

## 2022-02-06 MED ORDER — HYDROCHLOROTHIAZIDE 25 MG PO TABS: ORAL_TABLET | ORAL | 0 refills | Status: DC

## 2022-02-06 NOTE — Progress Notes (Signed)
Name: Joan Davis   MRN: [id number]    DOB: Nov 25, 1966   Date:02/06/2022 ? ?     Progress Note ? ?Subjective ? ?Chief Complaint ? ?Discuss Weight ? ?HPI ? ?HTN:  ? ?Morbid obesity: . She states she has been obese all her life, since childhood. Her heaviest was 309 lbs in 2020 She states at the time she changed her eating habits - avoiding bread, desserts and also going to the gym daily. She lost down to 268 lbs Currently on Golo but not losing any weight, today weight is 272.7 lbs but frustrated because she cannot lose any extra pounds. She has right knee and lower back pain, she is currently trying Golo supplementation, she states it helped her lose weight in the past but not helping her lose weight now. She asked me about Mancel Parsons, she denies personal history of pancreatitis and no family history of thyroid cancer Discussed possible side effects, including worsening symptoms of GERD  ? ?GERD: taking PPI and states substernal pain has resolved, no dysphagia.  ? ?Dyslipidemia: discussed results below, she was given Crestor last year but does not like taking medication. She states she will try to quit smoking first  ? ?The 10-year ASCVD risk score (Arnett DK, et al., 2019) is: 15.4% ?  Values used to calculate the score: ?    Age: 56 years ?    Sex: Female ?    Is Non-Hispanic African American: Yes ?    Diabetic: No ?    Tobacco smoker: Yes ?    Systolic Blood Pressure: 235 mmHg ?    Is BP treated: Yes ?    HDL Cholesterol: 48 mg/dL ?    Total Cholesterol: 237 mg/dL  ? ?HTN: she has been taking HCTZ no side effects, no chest pain or palpitation. BP is good  ? ?Chronic bronchitis : recently had a flare but back to baseline. She denies cough but has SOB with moderate activity. She also has prolonged cough and increase in SOB when she has a URI. Discussed tobacco cessation  ? ?Goiter: following up with Endo and will have [place] today  ? ?Patient Active Problem List  ? Diagnosis Date Noted  ? Pain of right breast 12/27/2020   ? Multinodular goiter 09/13/2020  ? Palpitations 05/13/2020  ? H/O arthroscopy of right knee 01/14/2019  ? Morbid obesity ([place]) 01/14/2019  ? Mood swings 01/14/2019  ? Hot flushes, perimenopausal 01/14/2019  ? Tobacco use disorder 01/14/2019  ? Gastroesophageal reflux disease without esophagitis 01/14/2019  ? Mixed hyperlipidemia 07/29/2018  ? Dizziness 02/06/2018  ? COPD (chronic obstructive pulmonary disease) ([place]) 12/20/2017  ? Hypertension 12/20/2017  ? ? ?Past Surgical History:  ?Procedure Laterality Date  ? CHOLECYSTECTOMY    ? CHONDROPLASTY Right 12/09/2018  ? Procedure: CHONDROPLASTY LATERAL FEMORAL CONDYLE;  Surgeon: Thornton Park, MD;  Location: [facility] ORS;  Service: Orthopedics;  Laterality: Right;  ? ESOPHAGOGASTRODUODENOSCOPY (EGD) WITH PROPOFOL N/A 08/06/2019  ? Procedure: ESOPHAGOGASTRODUODENOSCOPY (EGD) WITH PROPOFOL;  Surgeon: Jonathon Bellows, MD;  Location: [facility] ENDOSCOPY;  Service: Gastroenterology;  Laterality: N/A;  ? FOREIGN BODY REMOVAL N/A 07/15/2016  ? Procedure: FOREIGN BODY REMOVAL;  Surgeon: Lucilla Lame, MD;  Location: [facility] ENDOSCOPY;  Service: Endoscopy;  Laterality: N/A;  ? KNEE ARTHROSCOPY Right 12/09/2018  ? Procedure: ARTHROSCOPY KNEE WITH PARTIAL LATERAL MENISCECTOMY;  Surgeon: Thornton Park, MD;  Location: [facility] ORS;  Service: Orthopedics;  Laterality: Right;  ? TOTAL ABDOMINAL HYSTERECTOMY    ? ? ?Family History  ?Problem Relation  Age of Onset  ? Alcohol abuse Mother   ? Heart disease Father 72  ? Hypertension Father   ? Hypertension Brother   ? Hyperlipidemia Brother   ? Heart disease Brother 15  ? Asthma Maternal Grandmother   ? Stroke Maternal Grandfather   ? Hypertension Maternal Grandfather   ? Congestive Heart Failure Paternal Grandmother   ? Heart disease Paternal Grandfather   ? Congestive Heart Failure Paternal Grandfather   ? ? ?Social History  ? ?Tobacco Use  ? Smoking status: Every Day  ?  Packs/day: 0.20  ?  Years: 31.00  ?  Pack years: 6.20  ?  Types: Cigarettes  ?   Start date: 03/09/1988  ? Smokeless tobacco: Never  ?Substance Use Topics  ? Alcohol use: No  ? ? ? ?Current Outpatient Medications:  ?  albuterol (VENTOLIN HFA) 108 (90 Base) MCG/ACT inhaler, Inhale 2 puffs into the lungs every 6 (six) hours as needed for wheezing or shortness of breath., Disp: 18 g, Rfl: 1 ?  azelastine (ASTELIN) 0.1 % nasal spray, Place 2 sprays into both nostrils 2 (two) times daily. Use in each nostril as directed, Disp: 30 mL, Rfl: 2 ?  Cyanocobalamin (VITAMIN B-12 PO), Take by mouth daily., Disp: , Rfl:  ?  fluticasone (FLONASE) 50 MCG/ACT nasal spray, Place 2 sprays into both nostrils daily., Disp: 16 g, Rfl: 0 ?  hydrochlorothiazide (HYDRODIURIL) 25 MG tablet, TAKE 1 TABLET(25 MG) BY MOUTH DAILY, Disp: 90 tablet, Rfl: 0 ?  loratadine (CLARITIN) 10 MG tablet, Take 10 mg by mouth daily., Disp: , Rfl:  ?  meclizine (ANTIVERT) 25 MG tablet, Take 0.5-1 tablets (12.5-25 mg total) by mouth 2 (two) times daily as needed for dizziness., Disp: 100 tablet, Rfl: 0 ?  Multiple Vitamin (MULTIVITAMIN) capsule, Take 1 capsule by mouth daily., Disp: , Rfl:  ?  omeprazole (PRILOSEC) 40 MG capsule, Take 1 capsule (40 mg total) by mouth daily., Disp: 90 capsule, Rfl: 1 ?  rosuvastatin (CRESTOR) 10 MG tablet, Take 1 tablet (10 mg total) by mouth daily., Disp: 90 tablet, Rfl: 1 ?  VITAMIN D PO, Take by mouth daily., Disp: , Rfl:  ?  Zinc 50 MG TABS, Take by mouth., Disp: , Rfl:  ? ?No Known Allergies ? ?I personally reviewed active problem list, medication list, allergies, family history, social history, health maintenance with the patient/caregiver today. ? ? ?ROS ? ?Constitutional: Negative for fever or weight change.  ?Respiratory: Negative for cough and shortness of breath.   ?Cardiovascular: Negative for chest pain or palpitations.  ?Gastrointestinal: Negative for abdominal pain, no bowel changes.  ?Musculoskeletal: Negative for gait problem or joint swelling.  ?Skin: Negative for rash.  ?Neurological:  Negative for dizziness or headache.  ?No other specific complaints in a complete review of systems (except as listed in HPI above).  ? ?Objective ? ?Vitals:  ? 02/06/22 1416  ?BP: 136/78  ?Pulse: 96  ?Resp: 16  ?SpO2: 100%  ?Weight: 272 lb (123.4 kg)  ?Height: '5\' 8"'$  (1.727 m)  ? ? ?Body mass index is 41.36 kg/m?. ? ?Physical Exam ? ?Constitutional: Patient appears well-developed and well-nourished. Obese  No distress.  ?HEENT: head atraumatic, normocephalic, pupils equal and reactive to light, neck supple, ?Cardiovascular: Normal rate, regular rhythm and normal heart sounds.  No murmur heard. No BLE edema. ?Pulmonary/Chest: Effort normal and breath sounds normal. No respiratory distress. ?Abdominal: Soft.  There is no tenderness. ?Psychiatric: Patient has a normal mood and affect. behavior is normal.  Judgment and thought content normal.  ? ?Recent Results (from the past 2160 hour(s))  ?Hepatic Function Panel     Status: None  ? Collection Time: 11/28/21 11:16 AM  ?Result Value Ref Range  ? Total Protein 7.1 6.0 - 8.5 g/dL  ? Albumin 4.5 3.8 - 4.9 g/dL  ? Bilirubin Total 0.3 0.0 - 1.2 mg/dL  ? Bilirubin, Direct <0.10 0.00 - 0.40 mg/dL  ? Alkaline Phosphatase 85 44 - 121 IU/L  ? AST 21 0 - 40 IU/L  ? ALT 20 0 - 32 IU/L  ? ? ?PHQ2/9: ?Depression screen [facility] 2/9 02/06/2022 01/31/2022 08/22/2021 04/28/2021 03/29/2021  ?Decreased Interest 0 0 0 0 0  ?Down, Depressed, Hopeless 0 0 0 0 0  ?PHQ - 2 Score 0 0 0 0 0  ?Altered sleeping 0 0 0 - 0  ?Tired, decreased energy 0 0 0 - 2  ?Change in appetite 0 0 0 - 1  ?Feeling bad or failure about yourself  0 0 0 - 0  ?Trouble concentrating 0 0 0 - 0  ?Moving slowly or fidgety/restless 0 0 0 - 0  ?Suicidal thoughts 0 0 0 - 0  ?PHQ-9 Score 0 0 0 - 3  ?Difficult doing work/chores - Not difficult at all Not difficult at all - Not difficult at all  ?Some recent data might be hidden  ?  ?phq 9 is negative ? ? ?Fall Risk: ?Fall Risk  02/06/2022 01/31/2022 08/22/2021 04/28/2021 03/29/2021  ?Falls in the  past year? 0 0 0 0 0  ?Number falls in past yr: 0 0 0 - -  ?Injury with Fall? 0 0 0 - -  ?Risk for fall due to : No Fall Risks - - - -  ?Follow up Falls prevention discussed - - - Falls prevention discussed  ? ?

## 2022-02-26 ENCOUNTER — Ambulatory Visit: Payer: No Typology Code available for payment source | Admitting: Family Medicine

## 2022-04-02 NOTE — Patient Instructions (Signed)

## 2022-04-02 NOTE — Progress Notes (Signed)
Name: Joan Davis   MRN: [id number]    DOB: Apr 05, 1966   Date:04/03/2022 ? ?     Progress Note ? ?Subjective ? ?Chief Complaint ? ?Annual Exam ? ?HPI ? ?Patient presents for annual CPE and follow up ? ?Morbid obesity: . She states she has been obese all her life, since childhood. Her heaviest was 309 lbs in 2020 She states at the time she changed her eating habits - avoiding bread, desserts and also going to the gym daily. She lost down to 268 lbs Currently on Golo but not losing any weight, she was seen  March  2023 at a weight of 272.7 lbs and was  frustrated because she cannot lose any extra pounds, so we started her on Wegovy and today weight is down to 262 lbs . She has right knee and lower back pain. We will adjust dose today  ?  ?GERD: takes PPI intermittent, she had an episode about 6 weeks ago of substernal chest pain, that was constant , dull like that lasted for 2 weeks, not associated with diaphoresis, nausea  or vomiting. She took omeprazole and finally resolved.  Discussed results of CT lung and coronary disease. She denies SOB and states pain was not triggered by food or activity was constant and tender to touch. - it may be costochondritis but symptoms returns she needs to come in, also discussed when to call 911  ?  ?Dyslipidemia/Atherosclerosis/CAD : discussed results below, she was given Crestor last year but is not taking medication , discussed CT scan and plaque formations, she is willing to try taking it. She will return in about 4-6 weeks to recheck labs  ?  ?The 10-year ASCVD risk score (Arnett DK, et al., 2019) is: 12% ?  Values used to calculate the score: ?    Age: 56 years ?    Sex: Female ?    Is Non-Hispanic African American: Yes ?    Diabetic: No ?    Tobacco smoker: Yes ?    Systolic Blood Pressure: 184 mmHg ?    Is BP treated: Yes ?    HDL Cholesterol: 48 mg/dL ?    Total Cholesterol: 237 mg/dL  ?  ?HTN: she has been taking HCTZ no side effects, no chest pain or palpitation.   ? ?Vertigo: seen by ENT years ago, still gets vertigo when turning in bed to the right side, wakes her up but no nausea or vomiting and able to fall back asleep, she has a balanced with ENT and does not want to go back at this time  ?  ?Chronic bronchitis /Emphysema: she had CT chest done and showed emphysematous changes. She denies cough , wheezing or sob. She  ?  ?Goiter: following up with Endo , last TSH slightly suppressed but still not on medication  ? ?Diet: smaller portions, balanced diet  ?Exercise: continue regular physical activity   ? ?[facility] Visit from 02/06/20[facility] in [facility]  ?AUDIT-C Score 0  ? ?  ? ?Depression: Phq 9 is  negative ? ?  04/03/2022  ?  3:23 PM 02/06/2022  ?  2:15 PM 01/31/2022  ?  9:09 AM 08/22/2021  ?  9:13 AM 04/28/2021  ?  1:56 PM  ?Depression screen PHQ 2/9  ?Decreased Interest 0 0 0 0 0  ?Down, Depressed, Hopeless 0 0 0 0 0  ?PHQ - 2 Score 0 0 0 0 0  ?Altered sleeping 0 0 0 0   ?  Tired, decreased energy 0 0 0 0   ?Change in appetite 0 0 0 0   ?Feeling bad or failure about yourself  0 0 0 0   ?Trouble concentrating 0 0 0 0   ?Moving slowly or fidgety/restless 0 0 0 0   ?Suicidal thoughts 0 0 0 0   ?PHQ-9 Score 0 0 0 0   ?Difficult doing work/chores   Not difficult at all Not difficult at all   ? ?Hypertension: ?BP Readings from Last 3 Encounters:  ?04/03/22 126/78  ?02/06/22 136/78  ?04/16/21 134/81  ? ?Obesity: ?Wt Readings from Last 3 Encounters:  ?04/03/22 262 lb (118.8 kg)  ?02/06/22 272 lb (123.4 kg)  ?04/28/21 270 lb 1.6 oz (122.5 kg)  ? ?BMI Readings from Last 3 Encounters:  ?04/03/22 39.84 kg/m?  ?02/06/22 41.36 kg/m?  ?04/28/21 40.47 kg/m?  ?  ? ?Vaccines:  ? ? ?Tdap: up to date ?Shingrix: 1 of 2,she will check coverage with insurance  ?Pneumonia: up to date  ?Flu: refused  ?COVID-19: discussed omicron booster ? ? ?Hep C Screening: 03/29/21 ?STD testing and prevention (HIV/chl/gon/syphilis): 07/25/18 ?Intimate partner violence: negative screen   ?Sexual History : not sexually active past two years  ?Menstrual History/LMP/Abnormal Bleeding: s/p hysterectomy for benign causes - fibroids  ?Discussed importance of follow up if any post-menopausal bleeding: not applicable  ?Incontinence Symptoms: positive for symptoms  ? ?Breast cancer:  ?- Last Mammogram: Ordered 02/06/22 ?- BRCA gene screening: N/A ? ?Osteoporosis Prevention : Discussed high calcium and vitamin D supplementation, weight bearing exercises ?Bone density :not applicable  ? ?Cervical cancer screening: Never ? ?Skin cancer: Discussed monitoring for atypical lesions  ?Colorectal cancer: 03/15/20   ?Lung cancer:  Low Dose CT Chest recommended if Age 19-80 years, 20 pack-year currently smoking OR have quit w/in 15years. Patient does  qualify for screen   ?ECG: 04/13/21 ? ?Advanced Care Planning: A voluntary discussion about advance care planning including the explanation and discussion of advance directives.  Discussed health care proxy and Living will, and the patient was able to identify a health care proxy as spm Ysidro Evert .  Patient does not have a living will and power of attorney of health care  ? ?Lipids: ?Lab Results  ?Component Value Date  ? CHOL 237 (H) 03/29/2021  ? CHOL 202 (H) 03/09/2020  ? CHOL 198 01/14/2019  ? ?Lab Results  ?Component Value Date  ? HDL 48 (L) 03/29/2021  ? HDL 44 (L) 03/09/2020  ? HDL 56 01/14/2019  ? ?Lab Results  ?Component Value Date  ? LDLCALC 158 (H) 03/29/2021  ? LDLCALC 136 (H) 03/09/2020  ? LDLCALC 117 (H) 01/14/2019  ? ?Lab Results  ?Component Value Date  ? TRIG 170 (H) 03/29/2021  ? TRIG 112 03/09/2020  ? TRIG 140 01/14/2019  ? ?Lab Results  ?Component Value Date  ? CHOLHDL 4.9 03/29/2021  ? CHOLHDL 4.6 03/09/2020  ? CHOLHDL 3.5 01/14/2019  ? ?No results found for: LDLDIRECT ? ?Glucose: ?Glucose  ?Date Value Ref Range Status  ?07/25/2013 102 (H) 65 - 99 mg/dL Final  ? ?Glucose, Bld  ?Date Value Ref Range Status  ?03/29/2021 85 65 - 99 mg/dL Final  ?  Comment:  ?   . ?           Fasting reference interval ?. ?  ?03/09/2020 86 65 - 99 mg/dL Final  ?  Comment:  ?  . ?           Fasting reference interval ?. ?  ?  09/21/2019 98 70 - 99 mg/dL Final  ? ?Glucose-Capillary  ?Date Value Ref Range Status  ?08/19/2019 85 70 - 99 mg/dL Final  ?12/23/2017 95 65 - 99 mg/dL Final  ? ? ?Patient Active Problem List  ? Diagnosis Date Noted  ? Pain of right breast 12/27/2020  ? Multinodular goiter 09/13/2020  ? Palpitations 05/13/2020  ? H/O arthroscopy of right knee 01/14/2019  ? Morbid obesity ([place]) 01/14/2019  ? Mood swings 01/14/2019  ? Hot flushes, perimenopausal 01/14/2019  ? Tobacco use disorder 01/14/2019  ? Gastroesophageal reflux disease without esophagitis 01/14/2019  ? Mixed hyperlipidemia 07/29/2018  ? Dizziness 02/06/2018  ? COPD (chronic obstructive pulmonary disease) ([place]) 12/20/2017  ? Hypertension 12/20/2017  ? ? ?Past Surgical History:  ?Procedure Laterality Date  ? CHOLECYSTECTOMY    ? CHONDROPLASTY Right 12/09/2018  ? Procedure: CHONDROPLASTY LATERAL FEMORAL CONDYLE;  Surgeon: Thornton Park, MD;  Location: [facility] ORS;  Service: Orthopedics;  Laterality: Right;  ? ESOPHAGOGASTRODUODENOSCOPY (EGD) WITH PROPOFOL N/A 08/06/2019  ? Procedure: ESOPHAGOGASTRODUODENOSCOPY (EGD) WITH PROPOFOL;  Surgeon: Jonathon Bellows, MD;  Location: [facility] ENDOSCOPY;  Service: Gastroenterology;  Laterality: N/A;  ? FOREIGN BODY REMOVAL N/A 07/15/2016  ? Procedure: FOREIGN BODY REMOVAL;  Surgeon: Lucilla Lame, MD;  Location: [facility] ENDOSCOPY;  Service: Endoscopy;  Laterality: N/A;  ? KNEE ARTHROSCOPY Right 12/09/2018  ? Procedure: ARTHROSCOPY KNEE WITH PARTIAL LATERAL MENISCECTOMY;  Surgeon: Thornton Park, MD;  Location: [facility] ORS;  Service: Orthopedics;  Laterality: Right;  ? TOTAL ABDOMINAL HYSTERECTOMY    ? ? ?Family History  ?Problem Relation Age of Onset  ? Alcohol abuse Mother   ? Heart disease Father 39  ? Hypertension Father   ? Hypertension Brother   ? Hyperlipidemia Brother   ? Heart disease Brother  34  ? Asthma Maternal Grandmother   ? Stroke Maternal Grandfather   ? Hypertension Maternal Grandfather   ? Congestive Heart Failure Paternal Grandmother   ? Heart disease Paternal Grandfather   ? Congestive Heart

## 2022-04-03 ENCOUNTER — Encounter: Payer: Self-pay | Admitting: Family Medicine

## 2022-04-03 ENCOUNTER — Ambulatory Visit (INDEPENDENT_AMBULATORY_CARE_PROVIDER_SITE_OTHER): Payer: BC Managed Care – PPO | Admitting: Family Medicine

## 2022-04-03 DIAGNOSIS — J3089 Other allergic rhinitis: Secondary | ICD-10-CM

## 2022-04-03 DIAGNOSIS — Z131 Encounter for screening for diabetes mellitus: Secondary | ICD-10-CM | POA: Diagnosis not present

## 2022-04-03 DIAGNOSIS — Z0000 Encounter for general adult medical examination without abnormal findings: Secondary | ICD-10-CM | POA: Diagnosis not present

## 2022-04-03 DIAGNOSIS — D3501 Benign neoplasm of right adrenal gland: Secondary | ICD-10-CM

## 2022-04-03 DIAGNOSIS — J410 Simple chronic bronchitis: Secondary | ICD-10-CM

## 2022-04-03 DIAGNOSIS — K219 Gastro-esophageal reflux disease without esophagitis: Secondary | ICD-10-CM

## 2022-04-03 DIAGNOSIS — J302 Other seasonal allergic rhinitis: Secondary | ICD-10-CM

## 2022-04-03 DIAGNOSIS — I700 Atherosclerosis of aorta: Secondary | ICD-10-CM

## 2022-04-03 DIAGNOSIS — I10 Essential (primary) hypertension: Secondary | ICD-10-CM

## 2022-04-03 DIAGNOSIS — E782 Mixed hyperlipidemia: Secondary | ICD-10-CM

## 2022-04-03 DIAGNOSIS — Z122 Encounter for screening for malignant neoplasm of respiratory organs: Secondary | ICD-10-CM | POA: Diagnosis not present

## 2022-04-03 DIAGNOSIS — Z1231 Encounter for screening mammogram for malignant neoplasm of breast: Secondary | ICD-10-CM

## 2022-04-03 DIAGNOSIS — E042 Nontoxic multinodular goiter: Secondary | ICD-10-CM

## 2022-04-03 MED ORDER — WEGOVY 0.5 MG/0.5ML ~~LOC~~ SOAJ: 0.5000 mg | SUBCUTANEOUS | 2 refills | Status: DC

## 2022-04-03 MED ORDER — ROSUVASTATIN CALCIUM 10 MG PO TABS: 10.0000 mg | ORAL_TABLET | Freq: Every day | ORAL | 1 refills | Status: DC

## 2022-04-03 MED ORDER — HYDROCHLOROTHIAZIDE 25 MG PO TABS: ORAL_TABLET | ORAL | 0 refills | Status: DC

## 2022-04-03 NOTE — Addendum Note (Signed)
Addended by: Steele Sizer F on: 04/03/2022 04:13 PM ? ? Modules accepted: Orders ? ?

## 2022-04-06 ENCOUNTER — Telehealth: Payer: Self-pay

## 2022-04-06 NOTE — Telephone Encounter (Signed)
Chest pains started 3 weeks. They had been mild on and off for months but worsen this last 3 week. Feels like a piercing pain. Pain sometimes radiates to shoulder and neck on right side.  ?

## 2022-04-06 NOTE — Telephone Encounter (Signed)
Pt request referral to Cardio for chest discomfort ?

## 2022-04-09 NOTE — Telephone Encounter (Signed)
Patient notified

## 2022-04-30 ENCOUNTER — Other Ambulatory Visit: Payer: Self-pay | Admitting: Nurse Practitioner

## 2022-04-30 DIAGNOSIS — J441 Chronic obstructive pulmonary disease with (acute) exacerbation: Secondary | ICD-10-CM

## 2022-05-01 NOTE — Telephone Encounter (Signed)
Requested Prescriptions  Pending Prescriptions Disp Refills  . albuterol (VENTOLIN HFA) 108 (90 Base) MCG/ACT inhaler [Pharmacy Med Name: ALBUTEROL HFA INH (200 PUFFS) 8.5GM] 8.5 g 1    Sig: INHALE 2 PUFFS INTO THE LUNGS EVERY 6 HOURS AS NEEDED FOR WHEEZING OR SHORTNESS OF BREATH     Pulmonology:  [facility] 2 Passed - 04/30/2022 10:37 AM      Passed - Last BP in normal range    BP Readings from Last 1 Encounters:  04/03/22 126/78         Passed - Last Heart Rate in normal range    Pulse Readings from Last 1 Encounters:  04/03/22 94         Passed - Valid encounter within last 12 months    Recent Outpatient Visits          4 weeks ago Morbid obesity [facility])   [facility] Steele Sizer, MD   2 months ago Morbid obesity [facility])   [facility] Steele Sizer, MD   3 months ago Viral upper respiratory tract infection   [place], FNP   8 months ago Appointment canceled by patient   [place], FNP   8 months ago Viral upper respiratory tract infection   [facility] Bo Merino, FNP      Future Appointments            In 2 months Steele Sizer, MD [facility]

## 2022-05-08 ENCOUNTER — Telehealth: Payer: Self-pay

## 2022-05-08 NOTE — Telephone Encounter (Signed)
Patient notified, will come for labs tomorrow and chest pains has went away

## 2022-05-09 DIAGNOSIS — I700 Atherosclerosis of aorta: Secondary | ICD-10-CM | POA: Diagnosis not present

## 2022-05-09 DIAGNOSIS — Z131 Encounter for screening for diabetes mellitus: Secondary | ICD-10-CM | POA: Diagnosis not present

## 2022-05-09 DIAGNOSIS — I10 Essential (primary) hypertension: Secondary | ICD-10-CM | POA: Diagnosis not present

## 2022-05-10 LAB — CBC WITH DIFFERENTIAL/PLATELET
Absolute Monocytes: 377 cells/uL (ref 200–950)
Basophils Absolute: 37 cells/uL (ref 0–200)
Basophils Relative: 0.5 %
Eosinophils Absolute: 148 cells/uL (ref 15–500)
Eosinophils Relative: 2 %
HCT: 39.5 % (ref 35.0–45.0)
Hemoglobin: 13.5 g/dL (ref 11.7–15.5)
Lymphs Abs: 3389 cells/uL (ref 850–3900)
MCH: 31.7 pg (ref 27.0–33.0)
MCHC: 34.2 g/dL (ref 32.0–36.0)
MCV: 92.7 fL (ref 80.0–100.0)
MPV: 10.2 fL (ref 7.5–12.5)
Monocytes Relative: 5.1 %
Neutro Abs: 3448 cells/uL (ref 1500–7800)
Neutrophils Relative %: 46.6 %
Platelets: 329 10*3/uL (ref 140–400)
RBC: 4.26 10*6/uL (ref 3.80–5.10)
RDW: 13 % (ref 11.0–15.0)
Total Lymphocyte: 45.8 %
WBC: 7.4 10*3/uL (ref 3.8–10.8)

## 2022-05-10 LAB — COMPLETE METABOLIC PANEL WITH GFR
AG Ratio: 1.4 (calc) (ref 1.0–2.5)
ALT: 19 U/L (ref 6–29)
AST: 16 U/L (ref 10–35)
Albumin: 3.9 g/dL (ref 3.6–5.1)
Alkaline phosphatase (APISO): 71 U/L (ref 37–153)
BUN: 13 mg/dL (ref 7–25)
CO2: 25 mmol/L (ref 20–32)
Calcium: 9.6 mg/dL (ref 8.6–10.4)
Chloride: 110 mmol/L (ref 98–110)
Creat: 0.82 mg/dL (ref 0.50–1.03)
Globulin: 2.8 g/dL (calc) (ref 1.9–3.7)
Glucose, Bld: 96 mg/dL (ref 65–99)
Potassium: 4.2 mmol/L (ref 3.5–5.3)
Sodium: 143 mmol/L (ref 135–146)
Total Bilirubin: 0.3 mg/dL (ref 0.2–1.2)
Total Protein: 6.7 g/dL (ref 6.1–8.1)
eGFR: 84 mL/min/{1.73_m2} (ref >=60)

## 2022-05-10 LAB — HEMOGLOBIN A1C
Hgb A1c MFr Bld: 5.2 % of total Hgb (ref <5.7)
Mean Plasma Glucose: 103 mg/dL
eAG (mmol/L): 5.7 mmol/L

## 2022-05-10 LAB — LIPID PANEL
Cholesterol: 191 mg/dL (ref <200)
HDL: 48 mg/dL — ABNORMAL LOW (ref >=50)
LDL Cholesterol (Calc): 123 mg/dL (calc) — ABNORMAL HIGH
Non-HDL Cholesterol (Calc): 143 mg/dL (calc) — ABNORMAL HIGH (ref <130)
Total CHOL/HDL Ratio: 4 (calc) (ref <5.0)
Triglycerides: 102 mg/dL (ref <150)

## 2022-05-14 ENCOUNTER — Telehealth: Payer: Self-pay

## 2022-05-14 NOTE — Telephone Encounter (Signed)
Would like to try Ozempic since she cannot get [facility]

## 2022-05-22 ENCOUNTER — Other Ambulatory Visit: Payer: Self-pay

## 2022-05-22 DIAGNOSIS — Z122 Encounter for screening for malignant neoplasm of respiratory organs: Secondary | ICD-10-CM

## 2022-05-22 DIAGNOSIS — F17210 Nicotine dependence, cigarettes, uncomplicated: Secondary | ICD-10-CM

## 2022-05-22 DIAGNOSIS — Z87891 Personal history of nicotine dependence: Secondary | ICD-10-CM

## 2022-06-19 ENCOUNTER — Ambulatory Visit (INDEPENDENT_AMBULATORY_CARE_PROVIDER_SITE_OTHER): Payer: BC Managed Care – PPO | Admitting: Acute Care

## 2022-06-19 ENCOUNTER — Encounter: Payer: Self-pay | Admitting: Acute Care

## 2022-06-19 DIAGNOSIS — F17210 Nicotine dependence, cigarettes, uncomplicated: Secondary | ICD-10-CM

## 2022-06-19 NOTE — Patient Instructions (Signed)
Thank you for participating in the [facility]. It was our pleasure to meet you today. We will call you with the results of your scan within the next few days. Your scan will be assigned a Lung RADS category score by the physicians reading the scans.  This Lung RADS score determines follow up scanning.  See below for description of categories, and follow up screening recommendations. We will be in touch to schedule your follow up screening annually or based on recommendations of our providers. We will fax a copy of your scan results to your Primary Care Physician, or the physician who referred you to the program, to ensure they have the results. Please call the office if you have any questions or concerns regarding your scanning experience or results.  Our office number is [phone]. Please speak with Nicky Phelps, RN. , or  Nellene Buckner RN, They are  our Lung Cancer Screening RN.'s If They are unavailable when you call, Please leave a message on the voice mail. We will return your call at our earliest convenience.This voice mail is monitored several times a day.  Remember, if your scan is normal, we will scan you annually as long as you continue to meet the criteria for the program. (Age 55-77, Current smoker or smoker who has quit within the last 15 years). If you are a smoker, remember, quitting is the single most powerful action that you can take to decrease your risk of lung cancer and other pulmonary, breathing related problems. We know quitting is hard, and we are here to help.  Please let us know if there is anything we can do to help you meet your goal of quitting. If you are a former smoker, congratulations. We are proud of you! Remain smoke free! Remember you can refer friends or family members through the number above.  We will screen them to make sure they meet criteria for the program. Thank you for helping us take better care of you by  participating in Lung Screening.  You can receive free nicotine replacement therapy ( patches, gum or mints) by calling 1-800-QUIT NOW. Please call so we can get you on the path to becoming  a non-smoker. I know it is hard, but you can do this!  Lung RADS Categories:  Lung RADS 1: no nodules or definitely non-concerning nodules.  Recommendation is for a repeat annual scan in 12 months.  Lung RADS 2:  nodules that are non-concerning in appearance and behavior with a very low likelihood of becoming an active cancer. Recommendation is for a repeat annual scan in 12 months.  Lung RADS 3: nodules that are probably non-concerning , includes nodules with a low likelihood of becoming an active cancer.  Recommendation is for a 6-month repeat screening scan. Often noted after an upper respiratory illness. We will be in touch to make sure you have no questions, and to schedule your 6-month scan.  Lung RADS 4 A: nodules with concerning findings, recommendation is most often for a follow up scan in 3 months or additional testing based on our provider's assessment of the scan. We will be in touch to make sure you have no questions and to schedule the recommended 3 month follow up scan.  Lung RADS 4 B:  indicates findings that are concerning. We will be in touch with you to schedule additional diagnostic testing based on our provider's  assessment of the scan.  Other options for assistance in smoking cessation (   As covered by your insurance benefits)  Hypnosis for smoking cessation  [employer] [phone]  Acupuncture for smoking cessation  [facility] [phone]   

## 2022-06-19 NOTE — Progress Notes (Signed)
Virtual Visit via Telephone Note  I connected with [name] on 06/19/22 at  3:00 PM EDT by telephone and verified that I am speaking with the correct person using two identifiers.  Location: Patient:  At home Provider: [place], [place], [place], [address]    I discussed the limitations, risks, security and privacy concerns of performing an evaluation and management service by telephone and the availability of in person appointments. I also discussed with the patient that there may be a patient responsible charge related to this service. The patient expressed understanding and agreed to proceed.   Shared Decision Making Visit Lung Cancer Screening Program [phone])   Eligibility: Age 56 y.o. Pack Years Smoking History Calculation 34 pack year smoking history (# packs/per year x # years smoked) Recent History of coughing up blood  no Unexplained weight loss? no ( >Than 15 pounds within the last 6 months ) Prior History Lung / other cancer no (Diagnosis within the last 5 years already requiring surveillance chest CT Scans). Smoking Status Current Smoker Former Smokers: Years since quit:  NA  Quit Date:  NA  Visit Components: Discussion included one or more decision making aids. yes Discussion included risk/benefits of screening. yes Discussion included potential follow up diagnostic testing for abnormal scans. yes Discussion included meaning and risk of over diagnosis. yes Discussion included meaning and risk of False Positives. yes Discussion included meaning of total radiation exposure. yes  Counseling Included: Importance of adherence to annual lung cancer LDCT screening. yes Impact of comorbidities on ability to participate in the program. yes Ability and willingness to under diagnostic treatment. yes  Smoking Cessation Counseling: Current Smokers:  Discussed importance of smoking cessation. yes Information about tobacco cessation classes and  interventions provided to patient. yes Patient provided with "ticket" for LDCT Scan. yes Symptomatic Patient. no  Counseling NA Diagnosis Code: Tobacco Use Z72.0 Asymptomatic Patient yes  Counseling (Intermediate counseling: > three minutes counseling) [id number] Former Smokers:  Discussed the importance of maintaining cigarette abstinence. yes Diagnosis Code: Personal History of Nicotine Dependence. O75.643 Information about tobacco cessation classes and interventions provided to patient. Yes Patient provided with "ticket" for LDCT Scan. yes Written Order for Lung Cancer Screening with LDCT placed in Epic. Yes (CT Chest Lung Cancer Screening Low Dose W/O CM) [id number] Z12.2-Screening of respiratory organs Z87.891-Personal history of nicotine dependence  I have spent 25 minutes of face to face/ virtual visit   time with  Ms. Caesar Chestnut discussing the risks and benefits of lung cancer screening. We viewed / discussed a power point together that explained in detail the above noted topics. We paused at intervals to allow for questions to be asked and answered to ensure understanding.We discussed that the single most powerful action that she can take to decrease her risk of developing lung cancer is to quit smoking. We discussed whether or not she is ready to commit to setting a quit date. We discussed options for tools to aid in quitting smoking including nicotine replacement therapy, non-nicotine medications, support groups, Quit Smart classes, and behavior modification. We discussed that often times setting smaller, more achievable goals, such as eliminating 1 cigarette a day for a week and then 2 cigarettes a day for a week can be helpful in slowly decreasing the number of cigarettes smoked. This allows for a sense of accomplishment as well as providing a clinical benefit. I provided  her  with smoking cessation  information  with contact information for community resources, classes,  free nicotine  replacement therapy, and access to mobile apps, text messaging, and on-line smoking cessation help. I have also provided  her  the office contact information in the event she needs to contact me, or the screening staff. We discussed the time and location of the scan, and that either Doroteo Glassman RN, Joella Prince, RN  or I will call / send a letter with the results within 24-72 hours of receiving them. The patient verbalized understanding of all of  the above and had no further questions upon leaving the office. They have my contact information in the event they have any further questions.  I spent 3 minutes counseling on smoking cessation and the health risks of continued tobacco abuse.  I explained to the patient that there has been a high incidence of coronary artery disease noted on these exams. I explained that this is a non-gated exam therefore degree or severity cannot be determined. This patient is on statin therapy. I have asked the patient to follow-up with their PCP regarding any incidental finding of coronary artery disease and management with diet or medication as their PCP  feels is clinically indicated. The patient verbalized understanding of the above and had no further questions upon completion of the visit.      Magdalen Spatz, NP 06/19/2022

## 2022-06-20 ENCOUNTER — Ambulatory Visit: Admission: RE | Admit: 2022-06-20 | Payer: BC Managed Care – PPO | Source: Ambulatory Visit

## 2022-06-25 IMAGING — CR DG CHEST 2V
2 series · 2 of 2 positions shown · non-contrast
Comparison: 09/21/2019

CLINICAL DATA: Chest congestion, cough and nasal congestion since
[REDACTED], some bright red blood in mucus, history COPD, fever to
degrees

EXAM:
CHEST - 2 VIEW

[chest pa]
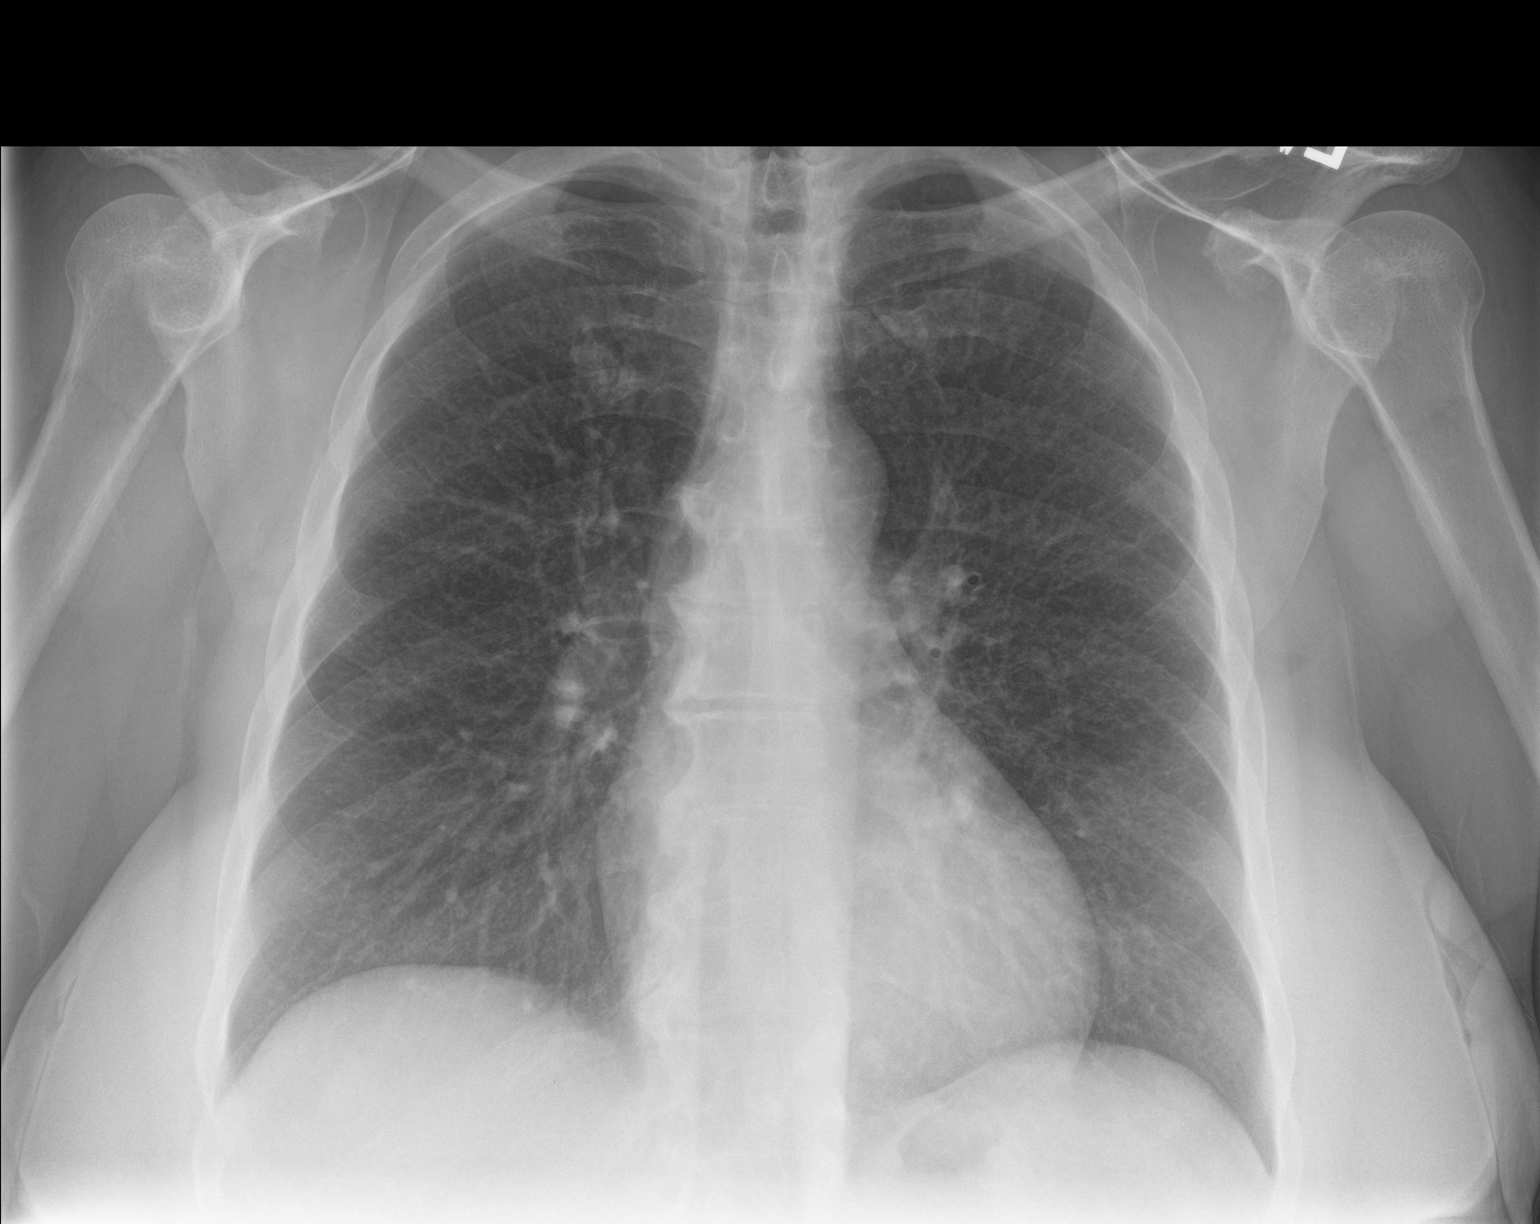

[chest lat]
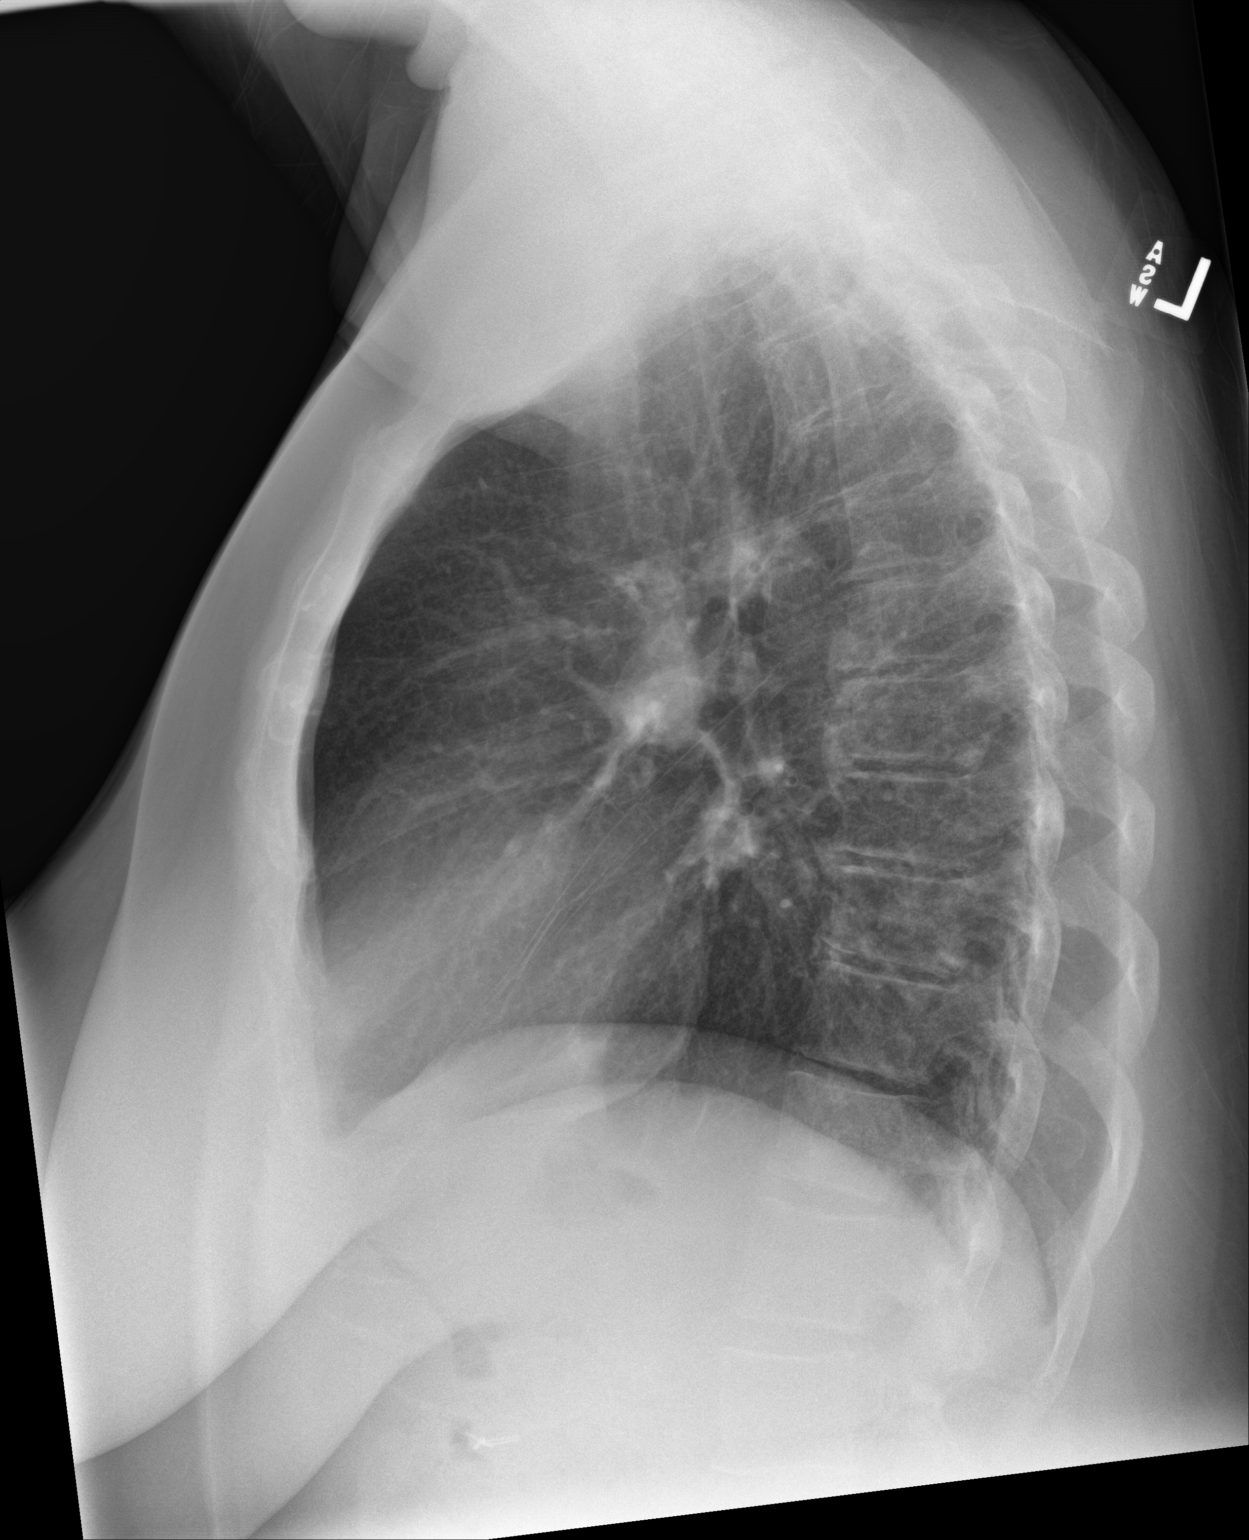

[2 of 2 positions shown; findings below may reference images not displayed]

FINDINGS: Normal heart size, mediastinal contours, and pulmonary vascularity.

Mild chronic bronchitic changes.

Lungs otherwise clear.

No pleural effusion or pneumothorax.

Bones unremarkable.
IMPRESSION: Mild chronic bronchitic changes without infiltrate.

## 2022-07-05 NOTE — Progress Notes (Deleted)
Name: Joan Davis   MRN: [id number]    DOB: [birth year]   Date:07/05/2022       Progress Note  Subjective  Chief Complaint  Follow Up  HPI  Morbid obesity: . She states she has been obese all her life, since childhood. Her heaviest was 309 lbs in 2020 She states at the time she changed her eating habits - avoiding bread, desserts and also going to the gym daily. She lost down to 268 lbs Currently on Golo but not losing any weight, she was seen  March  2023 at a weight of 272.7 lbs and was  frustrated because she cannot lose any extra pounds, so we started her on Wegovy and today weight is down to 262 lbs . She has right knee and lower back pain. We will adjust dose today    GERD: takes PPI intermittent, she had an episode about 6 weeks ago of substernal chest pain, that was constant , dull like that lasted for 2 weeks, not associated with diaphoresis, nausea  or vomiting. She took omeprazole and finally resolved.  Discussed results of CT lung and coronary disease. She denies SOB and states pain was not triggered by food or activity was constant and tender to touch. - it may be costochondritis but symptoms returns she needs to come in, also discussed when to call 911    Dyslipidemia/Atherosclerosis/CAD : discussed results below, she was given Crestor last year but is not taking medication , discussed CT scan and plaque formations, she is willing to try taking it. She will return in about 4-6 weeks to recheck labs    The 10-year ASCVD risk score (Arnett DK, et al., 2019) is: 12%   Values used to calculate the score:     Age: 30 years     Sex: Female     Is Non-Hispanic African American: Yes     Diabetic: No     Tobacco smoker: Yes     Systolic Blood Pressure: 678 mmHg     Is BP treated: Yes     HDL Cholesterol: 48 mg/dL     Total Cholesterol: 237 mg/dL    HTN: she has been taking HCTZ no side effects, no chest pain or palpitation.   Vertigo: seen by ENT years ago, still gets vertigo  when turning in bed to the right side, wakes her up but no nausea or vomiting and able to fall back asleep, she has a balanced with ENT and does not want to go back at this time    Chronic bronchitis /Emphysema: she had CT chest done and showed emphysematous changes. She denies cough , wheezing or sob. She    Goiter: following up with Endo , last TSH slightly suppressed but still not on medication   Patient Active Problem List   Diagnosis Date Noted   Atherosclerosis of aorta ([place]) 04/03/2022   Adrenal adenoma, right 04/03/2022   Multinodular goiter 09/13/2020   H/O arthroscopy of right knee 01/14/2019   Morbid obesity ([place]) 01/14/2019   Mood swings 01/14/2019   Hot flushes, perimenopausal 01/14/2019   Tobacco use disorder 01/14/2019   Gastroesophageal reflux disease without esophagitis 01/14/2019   Mixed hyperlipidemia 07/29/2018   Vertigo 02/06/2018   COPD (chronic obstructive pulmonary disease) ([place]) 12/20/2017   Essential hypertension 12/20/2017    Past Surgical History:  Procedure Laterality Date   CHOLECYSTECTOMY     CHONDROPLASTY Right 12/09/2018   Procedure: CHONDROPLASTY LATERAL FEMORAL CONDYLE;  Surgeon: Thornton Park, MD;  Location: [facility] ORS;  Service: Orthopedics;  Laterality: Right;   ESOPHAGOGASTRODUODENOSCOPY (EGD) WITH PROPOFOL N/A 08/06/2019   Procedure: ESOPHAGOGASTRODUODENOSCOPY (EGD) WITH PROPOFOL;  Surgeon: Jonathon Bellows, MD;  [facility]: [facility] ENDOSCOPY;  Service: Gastroenterology;  Laterality: N/A;   FOREIGN BODY REMOVAL N/A 07/15/2016   Procedure: FOREIGN BODY REMOVAL;  Surgeon: Lucilla Lame, MD;  Location: [facility] ENDOSCOPY;  Service: Endoscopy;  Laterality: N/A;   KNEE ARTHROSCOPY Right 12/09/2018   Procedure: ARTHROSCOPY KNEE WITH PARTIAL LATERAL MENISCECTOMY;  Surgeon: Thornton Park, MD;  Location: [facility] ORS;  Service: Orthopedics;  Laterality: Right;   TOTAL ABDOMINAL HYSTERECTOMY      Family History  Problem Relation Age of Onset   Alcohol abuse Mother     Heart disease Father 84   Hypertension Father    Hypertension Brother    Hyperlipidemia Brother    Heart disease Brother 26   Asthma Maternal Grandmother    Stroke Maternal Grandfather    Hypertension Maternal Grandfather    Congestive Heart Failure Paternal Grandmother    Heart disease Paternal Grandfather    Congestive Heart Failure Paternal Grandfather     Social History   Tobacco Use   Smoking status: Every Day    Packs/day: 1.00    Years: 31.00    Total pack years: 31.00    Types: Cigarettes    Start date: 03/09/1988   Smokeless tobacco: Never  Substance Use Topics   Alcohol use: No     Current Outpatient Medications:    albuterol (VENTOLIN HFA) 108 (90 Base) MCG/ACT inhaler, INHALE 2 PUFFS INTO THE LUNGS EVERY 6 HOURS AS NEEDED FOR WHEEZING OR SHORTNESS OF BREATH, Disp: 8.5 g, Rfl: 1   azelastine (ASTELIN) 0.1 % nasal spray, Place 2 sprays into both nostrils 2 (two) times daily. Use in each nostril as directed, Disp: 30 mL, Rfl: 2   Cyanocobalamin (VITAMIN B-12 PO), Take by mouth daily., Disp: , Rfl:    fluticasone (FLONASE) 50 MCG/ACT nasal spray, Place 2 sprays into both nostrils daily., Disp: 16 g, Rfl: 0   hydrochlorothiazide (HYDRODIURIL) 25 MG tablet, TAKE 1 TABLET(25 MG) BY MOUTH DAILY, Disp: 90 tablet, Rfl: 0   loratadine (CLARITIN) 10 MG tablet, Take 10 mg by mouth daily., Disp: , Rfl:    meclizine (ANTIVERT) 25 MG tablet, Take 0.5-1 tablets (12.5-25 mg total) by mouth 2 (two) times daily as needed for dizziness., Disp: 100 tablet, Rfl: 0   Multiple Vitamin (MULTIVITAMIN) capsule, Take 1 capsule by mouth daily., Disp: , Rfl:    omeprazole (PRILOSEC) 40 MG capsule, Take 1 capsule (40 mg total) by mouth daily., Disp: 90 capsule, Rfl: 1   rosuvastatin (CRESTOR) 10 MG tablet, Take 1 tablet (10 mg total) by mouth daily., Disp: 90 tablet, Rfl: 1   Semaglutide-Weight Management (WEGOVY) 0.5 MG/0.5ML SOAJ, Inject 0.5 mg into the skin once a week., Disp: 2 mL, Rfl: 2    VITAMIN D PO, Take by mouth daily., Disp: , Rfl:    Zinc 50 MG TABS, Take by mouth., Disp: , Rfl:   No Known Allergies  I personally reviewed active problem list, medication list, allergies, family history, social history, health maintenance with the patient/caregiver today.   ROS  ***  Objective  There were no vitals filed for this visit.  There is no height or weight on file to calculate BMI.  Physical Exam ***  Recent Results (from the past 2160 hour(s))  Lipid panel     Status: Abnormal   Collection Time: 05/09/22 10:09  AM  Result Value Ref Range   Cholesterol 191 <200 mg/dL   HDL 48 (L) > OR = 50 mg/dL   Triglycerides 102 <150 mg/dL   LDL Cholesterol (Calc) 123 (H) mg/dL (calc)    Comment: Reference range: <100 . Desirable range <100 mg/dL for primary prevention;   <70 mg/dL for patients with CHD or diabetic patients  with > or = 2 CHD risk factors. Marland Kitchen LDL-C is now calculated using the Martin-Hopkins  calculation, which is a validated novel method providing  better accuracy than the Friedewald equation in the  estimation of LDL-C.  Cresenciano Genre et al. Annamaria Helling. 3329;518(84): 2061-2068  (http://education.[employer].com/faq/FAQ164)    Total CHOL/HDL Ratio 4.0 <5.0 (calc)   Non-HDL Cholesterol (Calc) 143 (H) <130 mg/dL (calc)    Comment: For patients with diabetes plus 1 major ASCVD risk  factor, treating to a non-HDL-C goal of <100 mg/dL  (LDL-C of <70 mg/dL) is considered a therapeutic  option.   CBC with Differential/Platelet     Status: None   Collection Time: 05/09/22 10:09 AM  Result Value Ref Range   WBC 7.4 3.8 - 10.8 Thousand/uL   RBC 4.26 3.80 - 5.10 Million/uL   Hemoglobin 13.5 11.7 - 15.5 g/dL   HCT 39.5 35.0 - 45.0 %   MCV 92.7 80.0 - 100.0 fL   MCH 31.7 27.0 - 33.0 pg   MCHC 34.2 32.0 - 36.0 g/dL   RDW 13.0 11.0 - 15.0 %   Platelets 329 140 - 400 Thousand/uL   MPV 10.2 7.5 - 12.5 fL   Neutro Abs 3,448 1,500 - 7,800 cells/uL   Lymphs Abs  3,389 850 - 3,900 cells/uL   Absolute Monocytes 377 200 - 950 cells/uL   Eosinophils Absolute 148 15 - 500 cells/uL   Basophils Absolute 37 0 - 200 cells/uL   Neutrophils Relative % 46.6 %   Total Lymphocyte 45.8 %   Monocytes Relative 5.1 %   Eosinophils Relative 2.0 %   Basophils Relative 0.5 %  COMPLETE METABOLIC PANEL WITH GFR     Status: None   Collection Time: 05/09/22 10:09 AM  Result Value Ref Range   Glucose, Bld 96 65 - 99 mg/dL    Comment: .            Fasting reference interval .    BUN 13 7 - 25 mg/dL   Creat 0.82 0.50 - 1.03 mg/dL   eGFR 84 > OR = 60 mL/min/1.25m    Comment: The eGFR is based on the CKD-EPI 2021 equation. To calculate  the new eGFR from a previous Creatinine or Cystatin C result, go to https://www.kidney.org/professionals/ kdoqi/gfr%5Fcalculator    BUN/Creatinine Ratio NOT APPLICABLE 6 - 22 (calc)   Sodium 143 135 - 146 mmol/L   Potassium 4.2 3.5 - 5.3 mmol/L   Chloride 110 98 - 110 mmol/L   CO2 25 20 - 32 mmol/L   Calcium 9.6 8.6 - 10.4 mg/dL   Total Protein 6.7 6.1 - 8.1 g/dL   Albumin 3.9 3.6 - 5.1 g/dL   Globulin 2.8 1.9 - 3.7 g/dL (calc)   AG Ratio 1.4 1.0 - 2.5 (calc)   Total Bilirubin 0.3 0.2 - 1.2 mg/dL   Alkaline phosphatase (APISO) 71 37 - 153 U/L   AST 16 10 - 35 U/L   ALT 19 6 - 29 U/L  Hemoglobin A1c     Status: None   Collection Time: 05/09/22 10:09 AM  Result Value Ref Range   Hgb  A1c MFr Bld 5.2 <5.7 % of total Hgb    Comment: For the purpose of screening for the presence of diabetes: . <5.7%       Consistent with the absence of diabetes 5.7-6.4%    Consistent with increased risk for diabetes             (prediabetes) > or =6.5%  Consistent with diabetes . This assay result is consistent with a decreased risk of diabetes. . Currently, no consensus exists regarding use of hemoglobin A1c for diagnosis of diabetes in children. . According to American Diabetes Association (ADA) guidelines, hemoglobin A1c <7.0%  represents optimal control in non-pregnant diabetic patients. Different metrics may apply to specific patient populations.  Standards of Medical Care in Diabetes(ADA). .    Mean Plasma Glucose 103 mg/dL   eAG (mmol/L) 5.7 mmol/L    PHQ2/9:    04/03/2022    3:23 PM 02/06/2022    2:15 PM 01/31/2022    9:09 AM 08/22/2021    9:13 AM 04/28/2021    1:56 PM  Depression screen PHQ 2/9  Decreased Interest 0 0 0 0 0  Down, Depressed, Hopeless 0 0 0 0 0  PHQ - 2 Score 0 0 0 0 0  Altered sleeping 0 0 0 0   Tired, decreased energy 0 0 0 0   Change in appetite 0 0 0 0   Feeling bad or failure about yourself  0 0 0 0   Trouble concentrating 0 0 0 0   Moving slowly or fidgety/restless 0 0 0 0   Suicidal thoughts 0 0 0 0   PHQ-9 Score 0 0 0 0   Difficult doing work/chores   Not difficult at all Not difficult at all     phq 9 is {gen pos [id number]}   Fall Risk:    04/03/2022    3:23 PM 02/06/2022    2:15 PM 01/31/2022    9:09 AM 08/22/2021    9:13 AM 04/28/2021    1:55 PM  Fall Risk   Falls in the past year? 0 0 0 0 0  Number falls in past yr: 0 0 0 0   Injury with Fall? 0 0 0 0   Risk for fall due to : No Fall Risks No Fall Risks     Follow up Falls prevention discussed Falls prevention discussed         Functional Status Survey:      Assessment & Plan  *** There are no diagnoses linked to this encounter.

## 2022-07-06 ENCOUNTER — Ambulatory Visit: Payer: BC Managed Care – PPO | Admitting: Family Medicine

## 2022-07-10 ENCOUNTER — Ambulatory Visit
Admission: RE | Admit: 2022-07-10 | Discharge: 2022-07-10 | Disposition: A | Discharge status: Home / Self Care | Payer: BC Managed Care – PPO | Source: Ambulatory Visit | Attending: Acute Care | Admitting: Acute Care

## 2022-07-10 DIAGNOSIS — Z122 Encounter for screening for malignant neoplasm of respiratory organs: Secondary | ICD-10-CM | POA: Insufficient documentation

## 2022-07-10 DIAGNOSIS — Z87891 Personal history of nicotine dependence: Secondary | ICD-10-CM | POA: Diagnosis not present

## 2022-07-10 DIAGNOSIS — F17210 Nicotine dependence, cigarettes, uncomplicated: Secondary | ICD-10-CM | POA: Diagnosis not present

## 2022-07-20 ENCOUNTER — Telehealth: Payer: Self-pay | Admitting: Acute Care

## 2022-07-20 NOTE — Telephone Encounter (Signed)
I have called the patient with the results of her scan as she called back. I explained that although her scan was read as a Lung RADS 2: , Both myself and Dr. Patsey Berthold felt the nodule had grown slightly since previous scanning, and we will do a 6 month follow up scan. She is in agreement with this plan. Please place order for 6 month follow up low dose CT Chest and fax results to PCP with plan for 6 month follow up.   Thanks so much

## 2022-07-20 NOTE — Telephone Encounter (Signed)
I have attempted to call the patient with the results of their  Low Dose CT Chest Lung cancer screening scan. There was no answer. I have left a HIPPA compliant VM requesting the patient call the office for the scan results. I included the office contact information in the message. We will await his return call. If no return call we will continue to call until patient is contacted.   Shyana, if you all can let her know she needs a 6 month follow up scan. Thanks so much

## 2022-07-23 ENCOUNTER — Other Ambulatory Visit: Payer: Self-pay | Admitting: Acute Care

## 2022-07-23 DIAGNOSIS — R911 Solitary pulmonary nodule: Secondary | ICD-10-CM

## 2022-07-23 DIAGNOSIS — Z87891 Personal history of nicotine dependence: Secondary | ICD-10-CM

## 2022-07-23 NOTE — Telephone Encounter (Signed)
See other telephone note from 07/20/2022

## 2022-07-23 NOTE — Telephone Encounter (Signed)
CT results faxed to PCP with follow up plans included. Order placed for 6 mth nodule f/u [facility] CT.  

## 2022-07-25 ENCOUNTER — Ambulatory Visit (INDEPENDENT_AMBULATORY_CARE_PROVIDER_SITE_OTHER): Payer: BC Managed Care – PPO | Admitting: Nurse Practitioner

## 2022-07-25 ENCOUNTER — Encounter: Payer: Self-pay | Admitting: Nurse Practitioner

## 2022-07-25 VITALS — BP 122/78 | HR 90 | Temp 98.7°F | Resp 18 | Ht 68.0 in | Wt 262.3 lb

## 2022-07-25 DIAGNOSIS — R911 Solitary pulmonary nodule: Secondary | ICD-10-CM | POA: Diagnosis not present

## 2022-07-25 DIAGNOSIS — R051 Acute cough: Secondary | ICD-10-CM

## 2022-07-25 DIAGNOSIS — J441 Chronic obstructive pulmonary disease with (acute) exacerbation: Secondary | ICD-10-CM | POA: Diagnosis not present

## 2022-07-25 MED ORDER — PREDNISONE 10 MG (21) PO TBPK: ORAL_TABLET | ORAL | 0 refills | Status: DC

## 2022-07-25 NOTE — Assessment & Plan Note (Signed)
continue taking tessalon perls, albuterol and Stiolto Respimat and albuterol inhaler, Will also send in steroid taper pack and get Covid test

## 2022-07-25 NOTE — Progress Notes (Signed)
BP 122/78   Pulse 90   Temp 98.7 F (37.1 C) (Oral)   Resp 18   Ht '5\' 8"'  (1.727 m)   Wt 262 lb 4.8 oz (119 kg)   SpO2 98%   BMI 39.88 kg/m    Subjective:    Patient ID: Joan Davis, female    DOB: 1966-02-09, 56 y.o.   MRN: [id number]  HPI: Joan Davis is a 56 y.o. female  Chief Complaint  Patient presents with   Cough    Bronchitis- taking tessalon pearles helping   Cough/COPD exacerbation: She reports that she has had a cough and shortness of breath for about a week and a half.  Patient denies any fever or chest pain.  Patient states she started taking Tessalon Perles Sunday night which has helped.  She is also taking Stiolto Respimat and albuterol inhaler. She says these inhalers have helped.  Will send in steroid taper pack.  Discussed getting covid test and she is in agreement with plan.    Lung nodule: She reports she had her CT chest for cancer screening and was told that she had a lung nodule.  Patient states she was told that the lung nodule has grown from the last time she had one.  Patient would like to see pulmonology.  Referral placed. Ct:  No pneumothorax. No pleural effusion. Mild centrilobular emphysema with diffuse bronchial wall thickening. Patchy subpleural bandlike consolidation and ground-glass opacity adjacent to multiple bulky right anterior paraspinal osteophytes in the medial right lower lobe, favor osteophyte related fibrosis, not appreciably changed. No lung masses. A few scattered solid pulmonary nodules, largest along the left major fissure measuring 7.1 mm in volume derived mean diameter (series 3/image 133), unchanged.  Relevant past medical, surgical, family and social history reviewed and updated as indicated. Interim medical history since our last visit reviewed. Allergies and medications reviewed and updated.  Review of Systems  Constitutional: Negative for fever or weight change.  Respiratory: Positive for cough and shortness  of breath.   Cardiovascular: Negative for chest pain or palpitations.  Gastrointestinal: Negative for abdominal pain, no bowel changes.  Musculoskeletal: Negative for gait problem or joint swelling.  Skin: Negative for rash.  Neurological: Negative for dizziness or headache.  No other specific complaints in a complete review of systems (except as listed in HPI above).      Objective:    BP 122/78   Pulse 90   Temp 98.7 F (37.1 C) (Oral)   Resp 18   Ht '5\' 8"'  (1.727 m)   Wt 262 lb 4.8 oz (119 kg)   SpO2 98%   BMI 39.88 kg/m   Wt Readings from Last 3 Encounters:  07/25/22 262 lb 4.8 oz (119 kg)  04/03/22 262 lb (118.8 kg)  02/06/22 272 lb (123.4 kg)    Physical Exam  Constitutional: Patient appears well-developed and well-nourished. Obese  No distress.  HEENT: head atraumatic, normocephalic, pupils equal and reactive to light,  neck supple Cardiovascular: Normal rate, regular rhythm and normal heart sounds.  No murmur heard. No BLE edema. Pulmonary/Chest: Effort normal and breath sounds wheezing no respiratory distress. Abdominal: Soft.  There is no tenderness. Psychiatric: Patient has a normal mood and affect. behavior is normal. Judgment and thought content normal.  Results for orders placed or performed in visit on 04/03/22  Lipid panel  Result Value Ref Range   Cholesterol 191 <200 mg/dL   HDL 48 (L) > OR = 50 mg/dL  Triglycerides 102 <150 mg/dL   LDL Cholesterol (Calc) 123 (H) mg/dL (calc)   Total CHOL/HDL Ratio 4.0 <5.0 (calc)   Non-HDL Cholesterol (Calc) 143 (H) <130 mg/dL (calc)  CBC with Differential/Platelet  Result Value Ref Range   WBC 7.4 3.8 - 10.8 Thousand/uL   RBC 4.26 3.80 - 5.10 Million/uL   Hemoglobin 13.5 11.7 - 15.5 g/dL   HCT 39.5 35.0 - 45.0 %   MCV 92.7 80.0 - 100.0 fL   MCH 31.7 27.0 - 33.0 pg   MCHC 34.2 32.0 - 36.0 g/dL   RDW 13.0 11.0 - 15.0 %   Platelets 329 140 - 400 Thousand/uL   MPV 10.2 7.5 - 12.5 fL   Neutro Abs 3,448 1,500 -  7,800 cells/uL   Lymphs Abs 3,389 850 - 3,900 cells/uL   Absolute Monocytes 377 200 - 950 cells/uL   Eosinophils Absolute 148 15 - 500 cells/uL   Basophils Absolute 37 0 - 200 cells/uL   Neutrophils Relative % 46.6 %   Total Lymphocyte 45.8 %   Monocytes Relative 5.1 %   Eosinophils Relative 2.0 %   Basophils Relative 0.5 %  COMPLETE METABOLIC PANEL WITH GFR  Result Value Ref Range   Glucose, Bld 96 65 - 99 mg/dL   BUN 13 7 - 25 mg/dL   Creat 0.82 0.50 - 1.03 mg/dL   eGFR 84 > OR = 60 mL/min/1.37m   BUN/Creatinine Ratio NOT APPLICABLE 6 - 22 (calc)   Sodium 143 135 - 146 mmol/L   Potassium 4.2 3.5 - 5.3 mmol/L   Chloride 110 98 - 110 mmol/L   CO2 25 20 - 32 mmol/L   Calcium 9.6 8.6 - 10.4 mg/dL   Total Protein 6.7 6.1 - 8.1 g/dL   Albumin 3.9 3.6 - 5.1 g/dL   Globulin 2.8 1.9 - 3.7 g/dL (calc)   AG Ratio 1.4 1.0 - 2.5 (calc)   Total Bilirubin 0.3 0.2 - 1.2 mg/dL   Alkaline phosphatase (APISO) 71 37 - 153 U/L   AST 16 10 - 35 U/L   ALT 19 6 - 29 U/L  Hemoglobin A1c  Result Value Ref Range   Hgb A1c MFr Bld 5.2 <5.7 % of total Hgb   Mean Plasma Glucose 103 mg/dL   eAG (mmol/L) 5.7 mmol/L      Assessment & Plan:   Problem List Items Addressed This Visit       Respiratory   COPD (chronic obstructive pulmonary disease) (HCC)    continue taking tessalon perls, albuterol and Stiolto Respimat and albuterol inhaler, Will also send in steroid taper pack and get Covid test      Relevant Medications   predniSONE (STERAPRED UNI-PAK 21 TAB) 10 MG (21) TBPK tablet   Other Visit Diagnoses     Acute cough    -  Primary   continue taking tessalon perls, albuterol and Stiolto Respimat and albuterol inhaler, will get covid test   Relevant Orders   Novel Coronavirus, NAA ([facility])   Lung nodule       referral placed to pulmonology   Relevant Orders   Ambulatory referral to Pulmonology        Follow up plan: Return if symptoms worsen or fail to improve.

## 2022-07-27 LAB — NOVEL CORONAVIRUS, NAA: SARS-CoV-2, NAA: NOT DETECTED

## 2022-08-03 ENCOUNTER — Ambulatory Visit (LOCAL_COMMUNITY_HEALTH_CENTER): Payer: Self-pay

## 2022-08-03 DIAGNOSIS — Z111 Encounter for screening for respiratory tuberculosis: Secondary | ICD-10-CM

## 2022-08-06 ENCOUNTER — Ambulatory Visit (LOCAL_COMMUNITY_HEALTH_CENTER): Payer: Self-pay

## 2022-08-06 ENCOUNTER — Other Ambulatory Visit: Payer: Self-pay

## 2022-08-06 DIAGNOSIS — Z111 Encounter for screening for respiratory tuberculosis: Secondary | ICD-10-CM

## 2022-08-06 LAB — TB SKIN TEST
Induration: 0 mm
TB Skin Test: NEGATIVE

## 2022-08-13 ENCOUNTER — Telehealth: Payer: Self-pay | Admitting: Family Medicine

## 2022-08-13 DIAGNOSIS — J441 Chronic obstructive pulmonary disease with (acute) exacerbation: Secondary | ICD-10-CM

## 2022-08-13 NOTE — Telephone Encounter (Signed)
Pt is calling to check on the status of the refill request. Please advise

## 2022-08-13 NOTE — Telephone Encounter (Signed)
Pt calling back to see why albuterol was denied. I gave her the message from Dr Ancil Boozer and she states that she doesn't know that Dr. Duwayne Heck would refill it and that she is needing it today and didn't understand why she wouldn't refill it when she was the provider who prescribed it. I advised her I would send message back for fu.

## 2022-08-14 ENCOUNTER — Encounter: Payer: Self-pay | Admitting: Pulmonary Disease

## 2022-08-14 ENCOUNTER — Ambulatory Visit (INDEPENDENT_AMBULATORY_CARE_PROVIDER_SITE_OTHER): Payer: BC Managed Care – PPO | Admitting: Pulmonary Disease

## 2022-08-14 VITALS — BP 128/82 | HR 83 | Temp 97.9°F | Ht 68.5 in | Wt 259.0 lb

## 2022-08-14 DIAGNOSIS — R911 Solitary pulmonary nodule: Secondary | ICD-10-CM

## 2022-08-14 DIAGNOSIS — J449 Chronic obstructive pulmonary disease, unspecified: Secondary | ICD-10-CM | POA: Diagnosis not present

## 2022-08-14 DIAGNOSIS — F17210 Nicotine dependence, cigarettes, uncomplicated: Secondary | ICD-10-CM | POA: Diagnosis not present

## 2022-08-14 MED ORDER — SYMBICORT 160-4.5 MCG/ACT IN AERO
2.0000 | INHALATION_SPRAY | Freq: Two times a day (BID) | RESPIRATORY_TRACT | 6 refills | Status: DC
Start: 2022-08-14 — End: 2024-10-13

## 2022-08-14 NOTE — Progress Notes (Signed)
Subjective:    Patient ID: Joan Davis, female    DOB: 1966-03-27, 56 y.o.   MRN: [id number] Patient Care Team: Steele Sizer, MD as PCP - General (Family Medicine) End, Harrell Gave, MD as PCP - Cardiology (Cardiology)  Chief Complaint  Patient presents with   Pulmonary Consult    Referred by Eric Form, NP for eval of pulmonary nodule. She states she has hx of bronchitis and has had been tx with pred recently. She has prod cough with clear sputum.    HPI Patient is a 56 year old current smoker (half PPD, 47 PY) who presents for evaluation of a pulmonary nodule noted on low-dose lung cancer screening CT.  She is kindly referred by Eric Form, NP, her primary care physician is Dr. Steele Sizer.  The patient has been told in the past she has COPD but does not have FEV1 recorded.  She has been enrolled in the lung cancer screening program recently.  Most recent scan was performed on 10 July 2022 and noted as 7.1 mm nodule along the left major fissure which was not previously well characterized.  Patient has noted some dyspnea on exertion for several years now, previously given a trial of Stiolto which she did not feel was helpful.  She states in the past she has tried Symbicort and this has been helpful.  Currently she is only using albuterol as needed.  When she uses albuterol dyspnea is improved.  She has had cough in the mornings with white sputum production, not change in character.  No hemoptysis.  She has not had any chest pain, fevers, chills or sweats.  No lower extremity edema and no calf tenderness.  No orthopnea or paroxysmal nocturnal dyspnea.   Review of Systems A 10 point review of systems was performed and it is as noted above otherwise negative.  Past Medical History:  Diagnosis Date   COPD (chronic obstructive pulmonary disease) ([place])    GERD (gastroesophageal reflux disease)    Hyperlipidemia    Hypertension    Vertigo    Past Surgical History:  Procedure  Laterality Date   CHOLECYSTECTOMY     CHONDROPLASTY Right 12/09/2018   Procedure: CHONDROPLASTY LATERAL FEMORAL CONDYLE;  Surgeon: Thornton Park, MD;  Location: [facility] ORS;  Service: Orthopedics;  Laterality: Right;   ESOPHAGOGASTRODUODENOSCOPY (EGD) WITH PROPOFOL N/A 08/06/2019   Procedure: ESOPHAGOGASTRODUODENOSCOPY (EGD) WITH PROPOFOL;  Surgeon: Jonathon Bellows, MD;  Location: [facility] ENDOSCOPY;  Service: Gastroenterology;  Laterality: N/A;   FOREIGN BODY REMOVAL N/A 07/15/2016   Procedure: FOREIGN BODY REMOVAL;  Surgeon: Lucilla Lame, MD;  Location: [facility] ENDOSCOPY;  Service: Endoscopy;  Laterality: N/A;   KNEE ARTHROSCOPY Right 12/09/2018   Procedure: ARTHROSCOPY KNEE WITH PARTIAL LATERAL MENISCECTOMY;  Surgeon: Thornton Park, MD;  Location: [facility] ORS;  Service: Orthopedics;  Laterality: Right;   TOTAL ABDOMINAL HYSTERECTOMY     Patient Active Problem List   Diagnosis Date Noted   Atherosclerosis of aorta ([place]) 04/03/2022   Adrenal adenoma, right 04/03/2022   Multinodular goiter 09/13/2020   H/O arthroscopy of right knee 01/14/2019   Morbid obesity ([place]) 01/14/2019   Mood swings 01/14/2019   Hot flushes, perimenopausal 01/14/2019   Tobacco use disorder 01/14/2019   Gastroesophageal reflux disease without esophagitis 01/14/2019   Mixed hyperlipidemia 07/29/2018   Vertigo 02/06/2018   COPD (chronic obstructive pulmonary disease) ([place]) 12/20/2017   Essential hypertension 12/20/2017   Family History  Problem Relation Age of Onset   Alcohol abuse Mother    Heart disease Father  60   Hypertension Father    Hypertension Brother    Hyperlipidemia Brother    Heart disease Brother 77   Asthma Maternal Grandmother    Stroke Maternal Grandfather    Hypertension Maternal Grandfather    Congestive Heart Failure Paternal Grandmother    Heart disease Paternal Grandfather    Congestive Heart Failure Paternal Grandfather    Social History   Tobacco Use   Smoking status: Every Day    Packs/day:  1.00    Years: 34.00    Total pack years: 34.00    Types: Cigarettes    Start date: 03/09/1988    Passive exposure: Current   Smokeless tobacco: Never  Substance Use Topics   Alcohol use: No   No Known Allergies Current Meds  Medication Sig   albuterol (VENTOLIN HFA) 108 (90 Base) MCG/ACT inhaler INHALE 2 PUFFS INTO THE LUNGS EVERY 6 HOURS AS NEEDED FOR WHEEZING OR SHORTNESS OF BREATH   azelastine (ASTELIN) 0.1 % nasal spray Place 2 sprays into both nostrils 2 (two) times daily. Use in each nostril as directed   Cyanocobalamin (VITAMIN B-12 PO) Take by mouth daily.   fluticasone (FLONASE) 50 MCG/ACT nasal spray Place 2 sprays into both nostrils daily.   hydrochlorothiazide (HYDRODIURIL) 25 MG tablet TAKE 1 TABLET(25 MG) BY MOUTH DAILY   loratadine (CLARITIN) 10 MG tablet Take 10 mg by mouth daily.   meclizine (ANTIVERT) 25 MG tablet Take 0.5-1 tablets (12.5-25 mg total) by mouth 2 (two) times daily as needed for dizziness.   Multiple Vitamin (MULTIVITAMIN) capsule Take 1 capsule by mouth daily.   omeprazole (PRILOSEC) 40 MG capsule Take 1 capsule (40 mg total) by mouth daily.   rosuvastatin (CRESTOR) 10 MG tablet Take 1 tablet (10 mg total) by mouth daily.   Semaglutide-Weight Management (WEGOVY) 0.5 MG/0.5ML SOAJ Inject 0.5 mg into the skin once a week.   VITAMIN D PO Take by mouth daily.   Zinc 50 MG TABS Take by mouth.   Immunization History  Administered Date(s) Administered   PFIZER(Purple Top)SARS-COV-2 Vaccination 12/03/2019, 12/26/2019   PNEUMOCOCCAL CONJUGATE-20 02/06/2022   PPD Test 08/15/2019, 08/03/2022   Tdap 07/25/2018   Zoster Recombinat (Shingrix) 03/29/2021      Objective:   Physical Exam BP 128/82 (BP Location: Left Arm, Cuff Size: Normal)   Pulse 83   Temp 97.9 F (36.6 C) (Oral)   Ht 5' 8.5" (1.74 m)   Wt 259 lb (117.5 kg)   SpO2 97% Comment: on RA  BMI 38.81 kg/m  GENERAL: Obese woman, no acute distress, fully ambulatory.  No conversational  dyspnea. HEAD: Normocephalic, atraumatic.  EYES: Pupils equal, round, reactive to light.  No scleral icterus.  MOUTH: Poor dentition, oral mucosa moist. NECK: Supple. No thyromegaly. Trachea midline. No JVD.  No adenopathy. PULMONARY: Good air entry bilaterally.  Coarse otherwise, no adventitious sounds. CARDIOVASCULAR: S1 and S2. Regular rate and rhythm.  No rubs, murmurs or gallops heard. ABDOMEN: Obese otherwise benign. MUSCULOSKELETAL: No joint deformity, no clubbing, no edema.  NEUROLOGIC: No overt focal deficit, no gait disturbance, speech is fluent. SKIN: Intact,warm,dry. PSYCH: Mood and behavior normal.   Presented to the image from CT performed 10 July 2022, independently reviewed: Showing a 7.1 mm nodule in the left major fissure:       Assessment & Plan:     ICD-10-CM   1. COPD suggested by initial evaluation [facility])  J44.9 Pulmonary Function Test [facility] Only   Obtain PFTs to better characterize Trial of  Symbicort 160/4.5, 2 inhalations twice a day Continue as needed albuterol Suspect major driver of dyspnea    2. Lung nodule seen on imaging study  R91.1    Small 7.1 mm nodule on the major fissure of the left lung Follow-up CT 6 months    3. Tobacco dependence due to cigarettes  F17.210    Patient counseled regards to discontinuation of smoking She is precontemplative with regards to discontinuation of smoking     Orders Placed This Encounter  Procedures   Pulmonary Function Test [facility] Only    Order Specific Question:   Full PFT: includes the following: basic spirometry, spirometry pre & post bronchodilator, diffusion capacity (DLCO), lung volumes    Answer:   Full PFT   Meds ordered this encounter  Medications   SYMBICORT 160-4.5 MCG/ACT inhaler    Sig: Inhale 2 puffs into the lungs 2 (two) times daily.    Dispense:  10.2 g    Refill:  6   Patient is to let us know if she is unable to obtain Symbicort in which case we can certainly give her a trial of  Breztri.  She already has a CT scan of the chest planned through the lung cancer screening program.  This will be performed in February 2024.  We will see her in follow-up in 3 months time.  She is to contact us prior to that time should any new difficulties arise.  Renold Don, MD Advanced Bronchoscopy PCCM [facility]    *This note was dictated using voice recognition software/Dragon.  Despite best efforts to proofread, errors can occur which can change the meaning. Any transcriptional errors that result from this process are unintentional and may not be fully corrected at the time of dictation.

## 2022-08-14 NOTE — Patient Instructions (Addendum)
We are getting some pulmonary function tests this will tell us about whether you have more COPD versus asthma.  I have sent a prescription to your pharmacy for Symbicort.  Let us know if this is not covered then we can try Judithann Sauger which is its sister medication.  We will see you in follow-up in 3 months time call sooner should any new problems arise.

## 2022-09-13 ENCOUNTER — Telehealth: Payer: Self-pay | Admitting: Pulmonary Disease

## 2022-09-13 NOTE — Telephone Encounter (Signed)
I spoke with [facility] and they do no have the Symbicort available. They tried to approve the generic and her insurance does not pay for it. I called [facility] and they do have the Symbicort available. I notified the patient and she is ok going to [facility] on [address] to pick you her prescription. I called [facility] and have the get the script from [facility] and they will contact the patient when it is ready.

## 2022-10-12 IMAGING — CR DG CHEST 2V
1 series · 2 of 2 positions shown · non-contrast
Comparison: 12/25/2020

CLINICAL DATA: Preop exam for gastric sleeve surgery. Morbid
obesity. No current complaints.

EXAM:
CHEST - 2 VIEW

[Series 1: dg chest 2 view · 0.14mm/px · 2 of 2 slices shown]
[im 1/2]
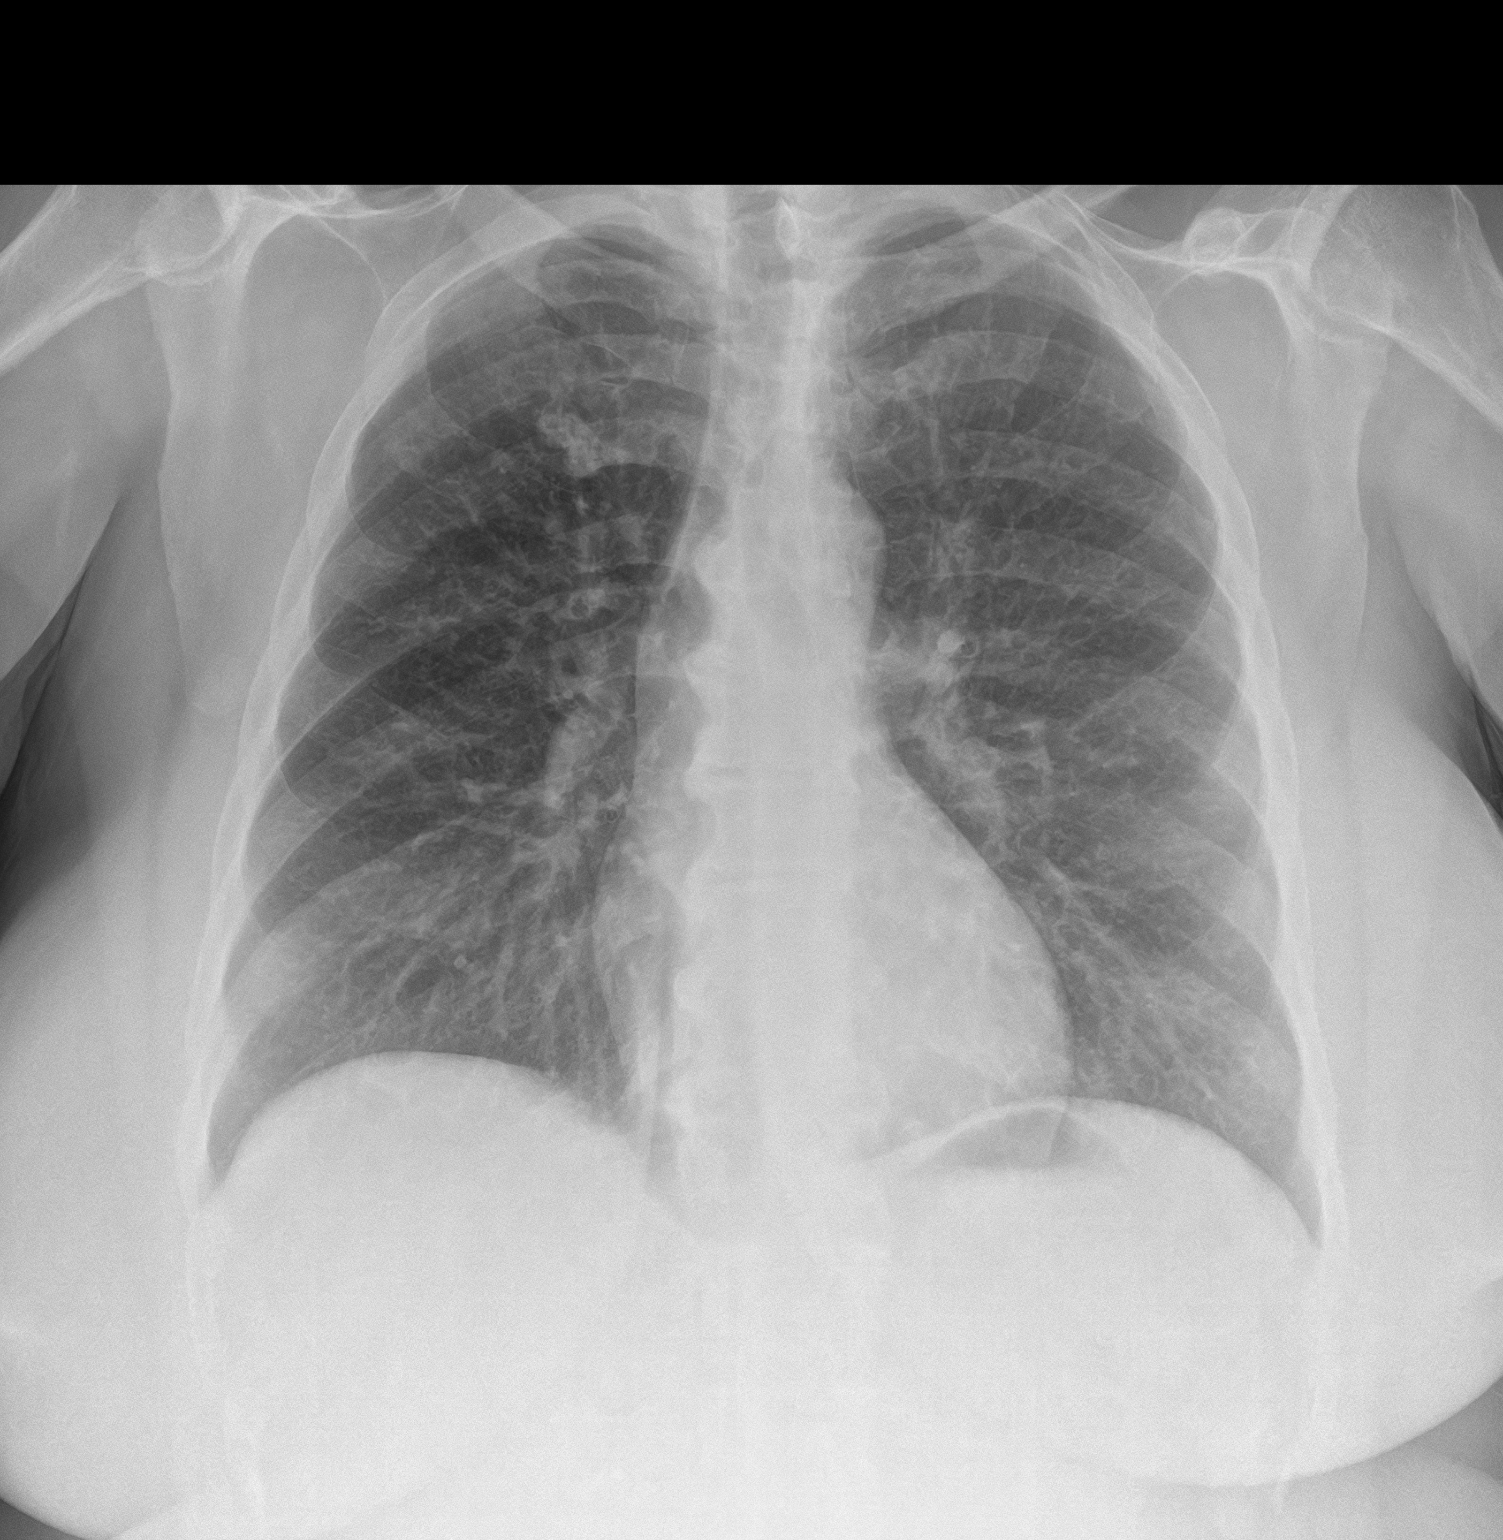
[im 2/2]
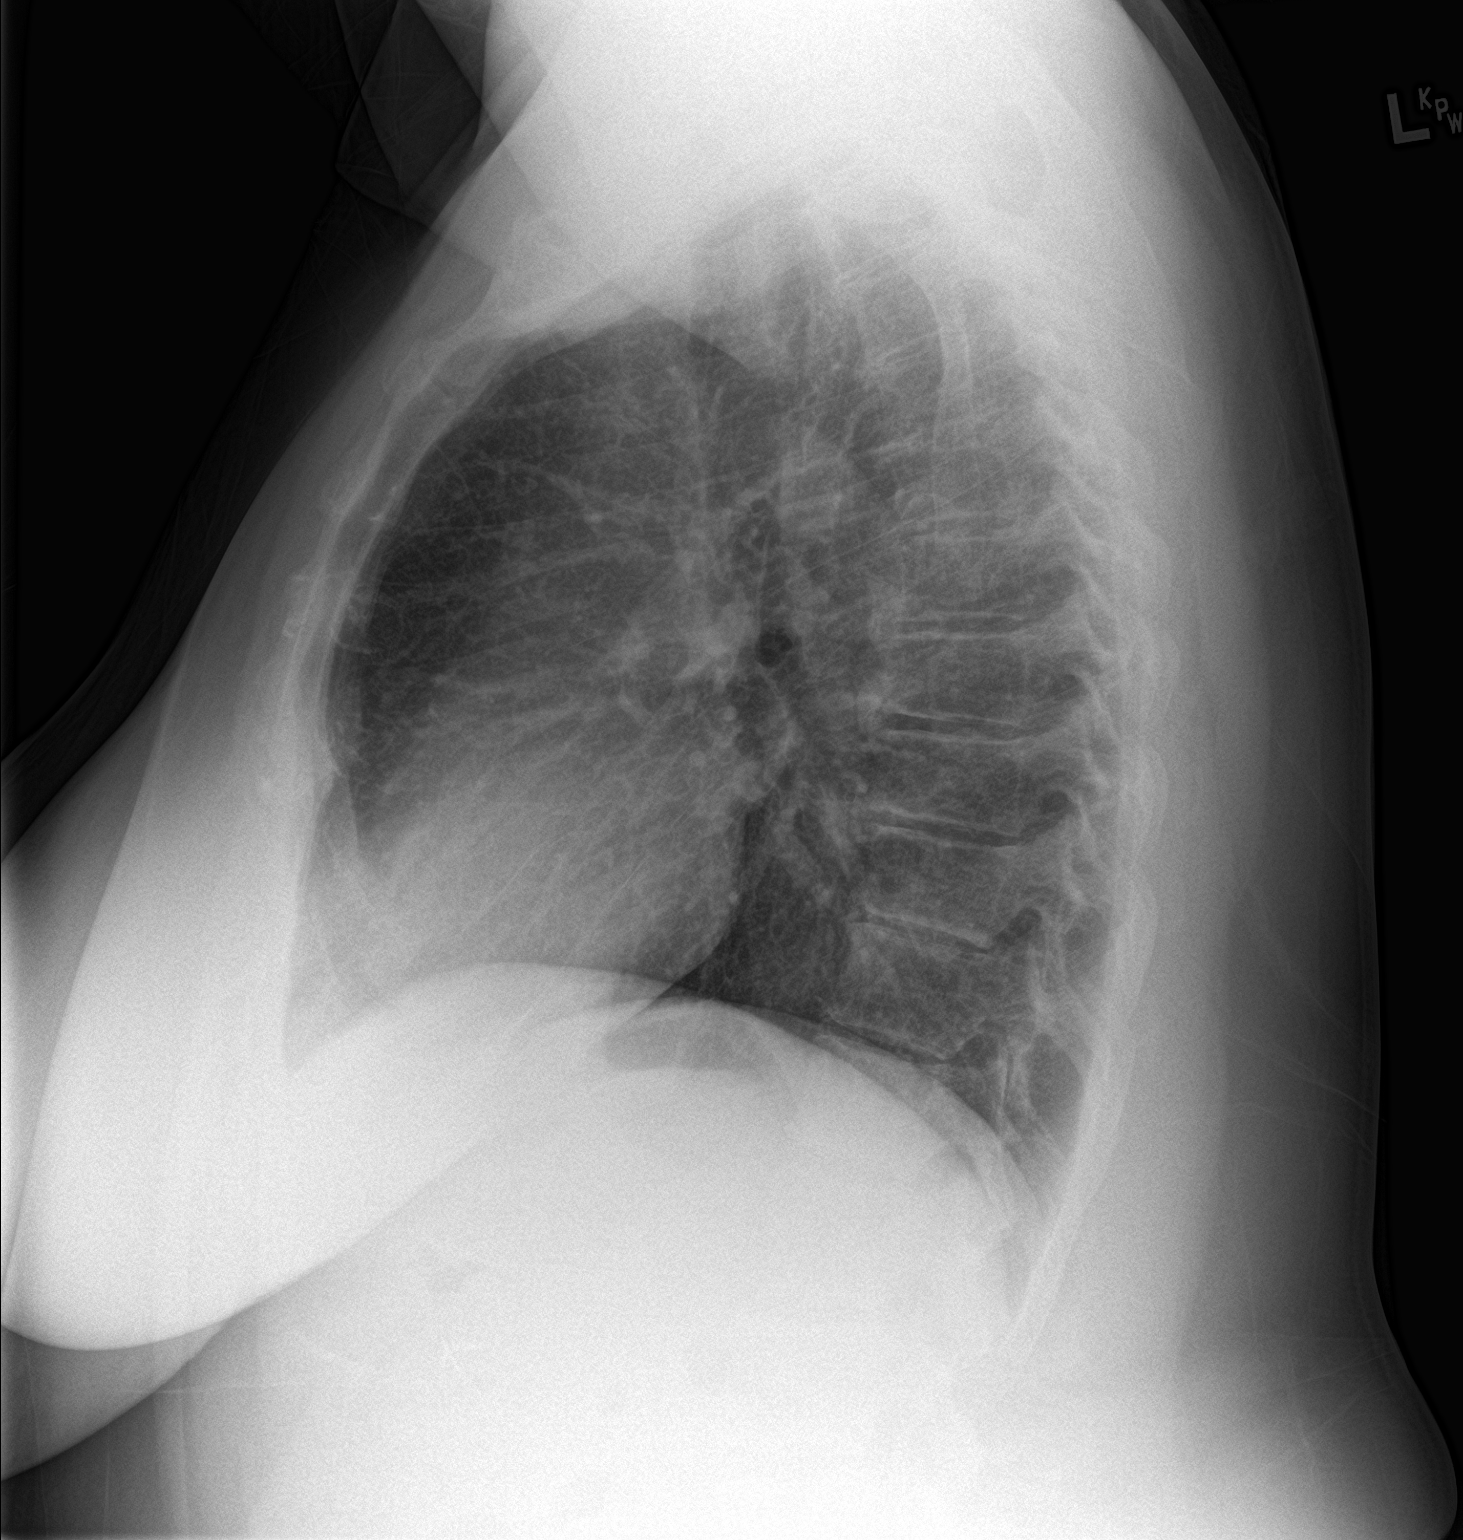

[2 of 2 positions shown; findings below may reference images not displayed]

FINDINGS: The heart size and mediastinal contours are within normal limits.
Both lungs are clear. Degenerative changes are seen in the thoracic
spine.
IMPRESSION: No evidence for acute  abnormality.

## 2022-10-19 ENCOUNTER — Ambulatory Visit: Payer: Self-pay | Admitting: *Deleted

## 2022-10-19 NOTE — Telephone Encounter (Signed)
  Chief Complaint: COPD- SOB with congestion Symptoms: chest congestion, cough, wheezing, fever Frequency: started Friday Pertinent Negatives: Patient denies  COVID- has tested Disposition: '[]'$ ED /'[x]'$ Urgent Care (no appt availability in office) / '[]'$ Appointment(In office/virtual)/ '[]'$  [place] [facility]/ '[]'$ Home Care/ '[]'$ Refused Recommended Disposition /'[]'$ [place] [facility]/ '[]'$  Follow-up with PCP Additional Notes: Office closed- UC advised

## 2022-10-19 NOTE — Telephone Encounter (Signed)
Reason for Disposition  [1] MODERATE difficulty breathing (e.g., speaks in phrases, SOB even at rest, pulse 100-120) AND [2] NEW-onset or WORSE than normal  Answer Assessment - Initial Assessment Questions 1. RESPIRATORY STATUS: "Describe your breathing?" (e.g., wheezing, shortness of breath, unable to speak, severe coughing)      Wheezing when laying- has to sit up 2. ONSET: "When did this breathing problem begin?"      Started Friday- throat and chest burning 3. PATTERN "Does the difficult breathing come and go, or has it been constant since it started?"      Comes and goes 4. SEVERITY: "How bad is your breathing?" (e.g., mild, moderate, severe)    - MILD: No SOB at rest, mild SOB with walking, speaks normally in sentences, can lie down, no retractions, pulse < 100.    - MODERATE: SOB at rest, SOB with minimal exertion and prefers to sit, cannot lie down flat, speaks in phrases, mild retractions, audible wheezing, pulse 100-120.    - SEVERE: Very SOB at rest, speaks in single words, struggling to breathe, sitting hunched forward, retractions, pulse > 120      Mild/moderate 5. RECURRENT SYMPTOM: "Have you had difficulty breathing before?" If Yes, ask: "When was the last time?" and "What happened that time?"      Yes- COPD, hx bronchitis 6. CARDIAC HISTORY: "Do you have any history of heart disease?" (e.g., heart attack, angina, bypass surgery, angioplasty)      no 7. LUNG HISTORY: "Do you have any history of lung disease?"  (e.g., pulmonary embolus, asthma, emphysema)     COPD 8. CAUSE: "What do you think is causing the breathing problem?"      Congestion- aggravating COPD 9. OTHER SYMPTOMS: "Do you have any other symptoms? (e.g., dizziness, runny nose, cough, chest pain, fever)     Cough, nasal congestion, fever last night, chest pain Friday 10. O2 SATURATION MONITOR:  "Do you use an oxygen saturation monitor (pulse oximeter) at home?" If Yes, ask: "What is your reading (oxygen level)  today?" "What is your usual oxygen saturation reading?" (e.g., 95%)       na 11. PREGNANCY: "Is there any chance you are pregnant?" "When was your last menstrual period?"       na 12. TRAVEL: "Have you traveled out of the country in the last month?" (e.g., travel history, exposures)       Works for hospice  Protocols used: Breathing Difficulty-A-AH

## 2022-11-15 ENCOUNTER — Ambulatory Visit: Payer: BC Managed Care – PPO | Admitting: Pulmonary Disease

## 2022-11-22 ENCOUNTER — Encounter: Payer: Self-pay | Admitting: Pulmonary Disease

## 2022-11-22 ENCOUNTER — Ambulatory Visit (INDEPENDENT_AMBULATORY_CARE_PROVIDER_SITE_OTHER): Payer: BC Managed Care – PPO | Admitting: Pulmonary Disease

## 2022-11-22 VITALS — BP 124/80 | HR 83 | Temp 97.9°F | Ht 68.5 in | Wt 257.8 lb

## 2022-11-22 DIAGNOSIS — R911 Solitary pulmonary nodule: Secondary | ICD-10-CM

## 2022-11-22 DIAGNOSIS — J440 Chronic obstructive pulmonary disease with acute lower respiratory infection: Secondary | ICD-10-CM

## 2022-11-22 DIAGNOSIS — R0602 Shortness of breath: Secondary | ICD-10-CM | POA: Diagnosis not present

## 2022-11-22 DIAGNOSIS — J449 Chronic obstructive pulmonary disease, unspecified: Secondary | ICD-10-CM | POA: Diagnosis not present

## 2022-11-22 DIAGNOSIS — J209 Acute bronchitis, unspecified: Secondary | ICD-10-CM

## 2022-11-22 LAB — NITRIC OXIDE: Nitric Oxide: 8

## 2022-11-22 MED ORDER — AZITHROMYCIN 250 MG PO TABS: ORAL_TABLET | ORAL | 0 refills | Status: AC

## 2022-11-22 NOTE — Progress Notes (Signed)
Subjective:    Patient ID: Joan Davis, female    DOB: 1966/01/27, 56 y.o.   MRN: [id number] Patient Care Team: Alba Cory, MD as PCP - General (Family Medicine) End, Cristal Deer, MD as PCP - Cardiology (Cardiology)  Chief Complaint  Patient presents with   Follow-up    No PFT- URI around thanksgiving--lingering sx since. C/O SOB, prod cough with clear to light yellow sputum and wheezing with laying flat.    HPI Patient is a 55 year old current smoker (half PPD, 49 PY) who presents for follow-up of a pulmonary nodule noted on low-dose lung cancer screening CT. she is also being followed for COPD FEV1 unknown.  Patient was initially seen here on 14 August 2022, at that time PFTs were ordered however she has not had these done yet.  The patient has been told in the past she has COPD but does not have FEV1 recorded.  She has been enrolled in the lung cancer screening program.  Most recent scan was performed on 10 July 2022 and noted as 7.1 mm nodule along the left major fissure which was not previously well characterized.  This was noted to be lung RADS 2.  She will have yearly CT chest per protocol.  Patient has noted some dyspnea on exertion for several years now, previously given a trial of Stiolto which she did not feel was helpful.  She states in the past she has tried Symbicort and this has been helpful.  Currently she is using Symbicort 160/4.5, 2 inhalations twice a day and albuterol as needed.  She does note however that she had an upper respiratory infection around Thanksgiving and has had lingering issues with cough since then.  The cough is now productive of yellow sputum.  She has had no hemoptysis.  She has not had any chest pain, fevers, chills or sweats.  No lower extremity edema and no calf tenderness.  No orthopnea or paroxysmal nocturnal dyspnea.   Review of Systems A 10 point review of systems was performed and it is as noted above otherwise negative.  Patient  Active Problem List   Diagnosis Date Noted   Atherosclerosis of aorta (H[facility]) 04/03/2022   Adrenal adenoma, right 04/03/2022   Multinodular goiter 09/13/2020   H/O arthroscopy of right knee 01/14/2019   Morbid obesity ([facility]) 01/14/2019   Mood swings 01/14/2019   Hot flushes, perimenopausal 01/14/2019   Tobacco use disorder 01/14/2019   Gastroesophageal reflux disease without esophagitis 01/14/2019   Mixed hyperlipidemia 07/29/2018   Vertigo 02/06/2018   COPD (chronic obstructive pulmonary disease) ([facility]) 12/20/2017   Essential hypertension 12/20/2017   Social History   Tobacco Use   Smoking status: Every Day    Packs/day: 1.00    Years: 34.00    Total pack years: 34.00    Types: Cigarettes    Start date: 03/09/1988    Passive exposure: Current   Smokeless tobacco: Never   Tobacco comments:    0.5PPD 11/23/2023  Substance Use Topics   Alcohol use: No   No Known Allergies  Current Meds  Medication Sig   albuterol (VENTOLIN HFA) 108 (90 Base) MCG/ACT inhaler INHALE 2 PUFFS INTO THE LUNGS EVERY 6 HOURS AS NEEDED FOR WHEEZING OR SHORTNESS OF BREATH   azelastine (ASTELIN) 0.1 % nasal spray Place 2 sprays into both nostrils 2 (two) times daily. Use in each nostril as directed   Cyanocobalamin (VITAMIN B-12 PO) Take by mouth daily.   fluticasone (FLONASE) 50 MCG/ACT nasal spray Place 2  sprays into both nostrils daily.   hydrochlorothiazide (HYDRODIURIL) 25 MG tablet TAKE 1 TABLET(25 MG) BY MOUTH DAILY   loratadine (CLARITIN) 10 MG tablet Take 10 mg by mouth daily.   meclizine (ANTIVERT) 25 MG tablet Take 0.5-1 tablets (12.5-25 mg total) by mouth 2 (two) times daily as needed for dizziness.   Multiple Vitamin (MULTIVITAMIN) capsule Take 1 capsule by mouth daily.   omeprazole (PRILOSEC) 40 MG capsule Take 1 capsule (40 mg total) by mouth daily.   rosuvastatin (CRESTOR) 10 MG tablet Take 1 tablet (10 mg total) by mouth daily.   SYMBICORT 160-4.5 MCG/ACT inhaler Inhale 2 puffs into the  lungs 2 (two) times daily.   VITAMIN D PO Take by mouth daily.   Zinc 50 MG TABS Take by mouth.   Immunization History  Administered Date(s) Administered   PFIZER(Purple Top)SARS-COV-2 Vaccination 12/03/2019, 12/26/2019   PNEUMOCOCCAL CONJUGATE-20 02/06/2022   PPD Test 08/15/2019, 08/03/2022   Tdap 07/25/2018   Zoster Recombinat (Shingrix) 03/29/2021       Objective:   Physical Exam BP 124/80 (BP Location: Left Arm, Cuff Size: Large)   Pulse 83   Temp 97.9 F (36.6 C) (Temporal)   Ht 5' 8.5" (1.74 m)   Wt 257 lb 12.8 oz (116.9 kg)   SpO2 98%   BMI 38.63 kg/m   SpO2: 98 % O2 Device: None (Room air)  GENERAL: Obese woman, no acute distress, fully ambulatory.  No conversational dyspnea. HEAD: Normocephalic, atraumatic.  EYES: Pupils equal, round, reactive to light.  No scleral icterus.  MOUTH: Poor dentition, oral mucosa moist. NECK: Supple. No thyromegaly. Trachea midline. No JVD.  No adenopathy. PULMONARY: Good air entry bilaterally.  Coarse otherwise, no adventitious sounds. CARDIOVASCULAR: S1 and S2. Regular rate and rhythm.  No rubs, murmurs or gallops heard. ABDOMEN: Obese otherwise benign. MUSCULOSKELETAL: No joint deformity, no clubbing, no edema.  NEUROLOGIC: No overt focal deficit, no gait disturbance, speech is fluent. SKIN: Intact,warm,dry. PSYCH: Mood and behavior normal.  Lab Results  Component Value Date   NITRICOXIDE 8 11/22/2022       Assessment & Plan:     ICD-10-CM   1. SOB (shortness of breath)  R06.02 Nitric oxide    ECHOCARDIOGRAM COMPLETE    Pulmonary Function Test [facility] Only   Multifactorial COPD/? Cardiac/deconditioning/obesity PFTs/2D echo    2. COPD suggested by initial evaluation [facility])  J44.9 Pulmonary Function Test [facility] Only   Reorder PFTs Use Symbicort Continue as needed albuterol    3. Acute bronchitis with COPD (HCC)  J44.0 Pulmonary Function Test [facility] Only   J20.9    Azithromycin treatment pack    4. Lung nodule seen on  imaging study  R91.1    Continue monitoring through lung cancer screening program     Orders Placed This Encounter  Procedures   Nitric oxide   Pulmonary Function Test [facility] Only    Standing Status:   Future    Standing Expiration Date:   11/23/2023    Order Specific Question:   Full PFT: includes the following: basic spirometry, spirometry pre & post bronchodilator, diffusion capacity (DLCO), lung volumes    Answer:   Full PFT    Order Specific Question:   This test can only be performed at    Answer:   [facility]   ECHOCARDIOGRAM COMPLETE    Standing Status:   Future    Number of Occurrences:   1    Standing Expiration Date:   11/23/2023    Order  Specific Question:   Where should this test be performed    Answer:   [facility] [place]    Order Specific Question:   Perflutren DEFINITY (image enhancing agent) should be administered unless hypersensitivity or allergy exist    Answer:   Administer Perflutren    Order Specific Question:   Reason for exam-Echo    Answer:   Dyspnea  R06.00   Meds ordered this encounter  Medications   azithromycin (ZITHROMAX) 250 MG tablet    Sig: Take 2 tablets (500 mg) on  Day 1,  followed by 1 tablet (250 mg) once daily on Days 2 through 5.    Dispense:  6 each    Refill:  0   See the patient in follow-up in 6 to 8 weeks time she is to contact us prior to that time should any new difficulties arise.  Gailen Shelter, MD Advanced Bronchoscopy PCCM [facility]    *This note was dictated using voice recognition software/Dragon.  Despite best efforts to proofread, errors can occur which can change the meaning. Any transcriptional errors that result from this process are unintentional and may not be fully corrected at the time of dictation.

## 2022-11-22 NOTE — Patient Instructions (Signed)
You may use plain Mucinex maximum strength over-the-counter (blue box) to help loosen up mucus and clear your lungs.  This is 1 tablet twice a day.  We have scheduled a heart test.  We will get the breathing test scheduled today.  Will see you in follow-up in 6 to 8 weeks time call sooner should any new problems arise.

## 2022-11-23 ENCOUNTER — Ambulatory Visit: Payer: BC Managed Care – PPO

## 2022-12-04 ENCOUNTER — Ambulatory Visit: Payer: BC Managed Care – PPO

## 2022-12-14 ENCOUNTER — Ambulatory Visit: Payer: BC Managed Care – PPO

## 2023-01-10 ENCOUNTER — Ambulatory Visit: Admission: RE | Admit: 2023-01-10 | Payer: BC Managed Care – PPO | Source: Ambulatory Visit

## 2023-01-15 ENCOUNTER — Ambulatory Visit: Payer: BC Managed Care – PPO | Attending: Pulmonary Disease

## 2023-01-23 ENCOUNTER — Ambulatory Visit: Payer: BC Managed Care – PPO | Attending: Pulmonary Disease

## 2023-01-24 ENCOUNTER — Ambulatory Visit: Payer: BC Managed Care – PPO | Admitting: Pulmonary Disease

## 2023-01-29 ENCOUNTER — Ambulatory Visit: Payer: BC Managed Care – PPO | Attending: Pulmonary Disease

## 2023-01-29 DIAGNOSIS — R0602 Shortness of breath: Secondary | ICD-10-CM | POA: Diagnosis not present

## 2023-01-29 LAB — ECHOCARDIOGRAM COMPLETE
AR max vel: 3.42 cm2
AV Area VTI: 3.55 cm2
AV Area mean vel: 3.53 cm2
AV Mean grad: 4 mmHg
AV Peak grad: 7.3 mmHg
Ao pk vel: 1.35 m/s
Area-P 1/2: 3.01 cm2
Calc EF: 53.8 %
S' Lateral: 3.4 cm
Single Plane A2C EF: 52.8 %
Single Plane A4C EF: 52.6 %

## 2023-02-06 DIAGNOSIS — E042 Nontoxic multinodular goiter: Secondary | ICD-10-CM | POA: Diagnosis not present

## 2023-04-02 DIAGNOSIS — E042 Nontoxic multinodular goiter: Secondary | ICD-10-CM | POA: Diagnosis not present

## 2023-04-02 DIAGNOSIS — E559 Vitamin D deficiency, unspecified: Secondary | ICD-10-CM | POA: Diagnosis not present

## 2023-04-19 ENCOUNTER — Ambulatory Visit: Payer: BC Managed Care – PPO | Admitting: Internal Medicine

## 2023-04-19 ENCOUNTER — Ambulatory Visit: Payer: Self-pay | Admitting: *Deleted

## 2023-04-19 ENCOUNTER — Encounter: Payer: Self-pay | Admitting: Internal Medicine

## 2023-04-19 VITALS — BP 122/72 | HR 95 | Temp 97.9°F | Resp 18 | Ht 68.0 in | Wt 251.3 lb

## 2023-04-19 DIAGNOSIS — J441 Chronic obstructive pulmonary disease with (acute) exacerbation: Secondary | ICD-10-CM

## 2023-04-19 DIAGNOSIS — R051 Acute cough: Secondary | ICD-10-CM

## 2023-04-19 MED ORDER — DOXYCYCLINE HYCLATE 100 MG PO TABS: 100.0000 mg | ORAL_TABLET | Freq: Two times a day (BID) | ORAL | 0 refills | Status: AC

## 2023-04-19 MED ORDER — PREDNISONE 20 MG PO TABS: 40.0000 mg | ORAL_TABLET | Freq: Every day | ORAL | 0 refills | Status: AC

## 2023-04-19 MED ORDER — PROMETHAZINE-DM 6.25-15 MG/5ML PO SYRP: 5.0000 mL | ORAL_SOLUTION | Freq: Four times a day (QID) | ORAL | 0 refills | Status: DC | PRN

## 2023-04-19 MED ORDER — ALBUTEROL SULFATE (2.5 MG/3ML) 0.083% IN NEBU: 2.5000 mg | INHALATION_SOLUTION | Freq: Four times a day (QID) | RESPIRATORY_TRACT | 1 refills | Status: DC | PRN

## 2023-04-19 MED ORDER — ALBUTEROL SULFATE HFA 108 (90 BASE) MCG/ACT IN AERS: 2.0000 | INHALATION_SPRAY | Freq: Four times a day (QID) | RESPIRATORY_TRACT | 1 refills | Status: DC | PRN

## 2023-04-19 NOTE — Telephone Encounter (Signed)
Reason for Disposition  [1] MILD difficulty breathing (e.g., minimal/no SOB at rest, SOB with walking, pulse <100) AND [2] NEW-onset or WORSE than normal  Answer Assessment - Initial Assessment Questions 1. RESPIRATORY STATUS: "Describe your breathing?" (e.g., wheezing, shortness of breath, unable to speak, severe coughing)      I'm having trouble breathing.  I have COPD.    When I breath in I start coughing.    2. ONSET: "When did this breathing problem begin?"      Yesterday   Non productive cough 3. PATTERN "Does the difficult breathing come and go, or has it been constant since it started?"      Constant since yesterday 4. SEVERITY: "How bad is your breathing?" (e.g., mild, moderate, severe)    - MILD: No SOB at rest, mild SOB with walking, speaks normally in sentences, can lie down, no retractions, pulse < 100.    - MODERATE: SOB at rest, SOB with minimal exertion and prefers to sit, cannot lie down flat, speaks in phrases, mild retractions, audible wheezing, pulse 100-120.    - SEVERE: Very SOB at rest, speaks in single words, struggling to breathe, sitting hunched forward, retractions, pulse > 120      Moderate 5. RECURRENT SYMPTOM: "Have you had difficulty breathing before?" If Yes, ask: "When was the last time?" and "What happened that time?"      Yes 6. CARDIAC HISTORY: "Do you have any history of heart disease?" (e.g., heart attack, angina, bypass surgery, angioplasty)      N/A 7. LUNG HISTORY: "Do you have any history of lung disease?"  (e.g., pulmonary embolus, asthma, emphysema)     COPD 8. CAUSE: "What do you think is causing the breathing problem?"      COPD 9. OTHER SYMPTOMS: "Do you have any other symptoms? (e.g., dizziness, runny nose, cough, chest pain, fever)     No 10. O2 SATURATION MONITOR:  "Do you use an oxygen saturation monitor (pulse oximeter) at home?" If Yes, ask: "What is your reading (oxygen level) today?" "What is your usual oxygen saturation reading?" (e.g.,  95%)       N/A 11. PREGNANCY: "Is there any chance you are pregnant?" "When was your last menstrual period?"       N/A 12. TRAVEL: "Have you traveled out of the country in the last month?" (e.g., travel history, exposures)       N/A  Protocols used: Breathing Difficulty-A-AH

## 2023-04-19 NOTE — Telephone Encounter (Signed)
  Chief Complaint: Difficulty breathing due to COPD flare up Symptoms: Short of breath, non productive cough Frequency: Since yesterday Pertinent Negatives: Patient denies fever or other URI symptoms Disposition: [] ED /[] Urgent Care (no appt availability in office) / [x] Appointment(In office/virtual)/ []  [place] [facility]/ [] Home Care/ [] Refused Recommended Disposition /[] [facility]/ []  Follow-up with PCP Additional Notes: Appt made for today with Dr. Caralee Ates for 9:40.   Dr Carlynn Purl did not have any openings today.

## 2023-04-19 NOTE — Progress Notes (Signed)
Acute Office Visit  Subjective:     Patient ID: Joan Davis, female    DOB: 1965/12/27, 57 y.o.   MRN: [id number]  Chief Complaint  Patient presents with   COPD    Cough, sob and wheezing    HPI Patient is in today for cough, potential COPD exacerbation. Symptoms started yesterday. Mainly feeling short of breath with exertion and at rest. She is wheezing, especially when laying flat but feels like lungs are tight. Coughing with deep breaths, non-productive. No fevers. No current URI symptoms but had some nasal congestion last week that resolved. Currently using Symbicort daily but states it doesn't help. Using Albuterol inhalers every 1-2 hours lately without relief. Did take some old Promethazine last night that helped with cough and is also using a neighbors nebulizer machine that is helping as well.    Review of Systems  Constitutional:  Negative for chills and fever.  HENT:  Negative for congestion, ear pain and sore throat.   Respiratory:  Positive for cough, shortness of breath and wheezing. Negative for sputum production.   Cardiovascular:  Negative for chest pain.        Objective:    BP 122/72   Pulse 95   Temp 97.9 F (36.6 C)   Resp 18   Ht 5\' 8"  (1.727 m)   Wt 251 lb 4.8 oz (114 kg)   SpO2 98%   BMI 38.21 kg/m  BP Readings from Last 3 Encounters:  04/19/23 122/72  11/22/22 124/80  08/14/22 128/82   Wt Readings from Last 3 Encounters:  04/19/23 251 lb 4.8 oz (114 kg)  11/22/22 257 lb 12.8 oz (116.9 kg)  08/14/22 259 lb (117.5 kg)      Physical Exam Constitutional:      Appearance: Normal appearance.  HENT:     Head: Normocephalic and atraumatic.  Eyes:     Conjunctiva/sclera: Conjunctivae normal.  Cardiovascular:     Rate and Rhythm: Normal rate and regular rhythm.  Pulmonary:     Effort: Pulmonary effort is normal.     Breath sounds: Wheezing present. No rhonchi or rales.     Comments: Tight, poor air movement with expiratory  wheezes Skin:    General: Skin is warm and dry.  Neurological:     General: No focal deficit present.     Mental Status: She is alert. Mental status is at baseline.  Psychiatric:        Mood and Affect: Mood normal.        Behavior: Behavior normal.     No results found for any visits on 04/19/23.      Assessment & Plan:   1. COPD exacerbation (HCC)/Acute cough: Lungs tight on exam with wheezes. Will treat with steroid burst, Doxycycline. Sample of Trelegy given to use while symptomatic. Refill Albuterol inhaler, prescribed Duonebs and faxed an order for a nebulizer machine to use as needed. Patient will follow up if symptoms worsen or fail to improve.  - predniSONE (DELTASONE) 20 MG tablet; Take 2 tablets (40 mg total) by mouth daily with breakfast for 5 days.  Dispense: 10 tablet; Refill: 0 - doxycycline (VIBRA-TABS) 100 MG tablet; Take 1 tablet (100 mg total) by mouth 2 (two) times daily for 5 days.  Dispense: 10 tablet; Refill: 0 - albuterol (PROVENTIL) (2.5 MG/3ML) 0.083% nebulizer solution; Take 3 mLs (2.5 mg total) by nebulization every 6 (six) hours as needed for wheezing or shortness of breath.  Dispense: 150 mL; Refill: 1 -  For home use only DME Other see comment - promethazine-dextromethorphan (PROMETHAZINE-DM) 6.25-15 MG/5ML syrup; Take 5 mLs by mouth 4 (four) times daily as needed for cough.  Dispense: 118 mL; Refill: 0 - albuterol (VENTOLIN HFA) 108 (90 Base) MCG/ACT inhaler; Inhale 2 puffs into the lungs every 6 (six) hours as needed for wheezing or shortness of breath.  Dispense: 8.5 g; Refill: 1   Return if symptoms worsen or fail to improve.  Margarita Mail, DO

## 2023-05-29 ENCOUNTER — Other Ambulatory Visit: Payer: Self-pay | Admitting: Internal Medicine

## 2023-05-29 DIAGNOSIS — J441 Chronic obstructive pulmonary disease with (acute) exacerbation: Secondary | ICD-10-CM

## 2023-05-29 NOTE — Telephone Encounter (Signed)
Requested Prescriptions  Pending Prescriptions Disp Refills   albuterol (VENTOLIN HFA) 108 (90 Base) MCG/ACT inhaler [Pharmacy Med Name: Albuterol Sulfate HFA 108 (90 Base) MCG/ACT Inhalation Aerosol Solution] 9 g 0    Sig: INHALE 2 PUFFS BY MOUTH EVERY 6 HOURS AS NEEDED FOR WHEEZING FOR SHORTNESS OF BREATH     Pulmonology:  Beta Agonists 2 Passed - 05/29/2023  6:51 AM      Passed - Last BP in normal range    BP Readings from Last 1 Encounters:  04/19/23 122/72         Passed - Last Heart Rate in normal range    Pulse Readings from Last 1 Encounters:  04/19/23 95         Passed - Valid encounter within last 12 months    Recent Outpatient Visits           1 month ago COPD exacerbation [facility])   [facility] Margarita Mail, DO   10 months ago Acute cough   [facility] Berniece Salines, FNP   1 year ago Morbid obesity [facility])   [facility] Alba Cory, MD   1 year ago Morbid obesity [facility])   [facility] [facility] Alba Cory, MD   1 year ago Viral upper respiratory tract infection   [facility] Berniece Salines, [place]

## 2023-05-31 ENCOUNTER — Encounter: Payer: Self-pay | Admitting: Pulmonary Disease

## 2023-06-03 ENCOUNTER — Other Ambulatory Visit: Payer: Self-pay | Admitting: Pulmonary Disease

## 2023-07-16 ENCOUNTER — Ambulatory Visit: Payer: Self-pay | Admitting: *Deleted

## 2023-07-16 NOTE — Progress Notes (Unsigned)
Name: Joan Davis   MRN: [id number]    DOB: [birth year]   Date:07/17/2023       Progress Note  Subjective  Chief Complaint  COVID Positive  I connected with  Tamala Ser  on 07/17/23 at  9:20 AM EDT by a video enabled telemedicine application and verified that I am speaking with the correct person using two identifiers.  I discussed the limitations of evaluation and management by telemedicine and the availability of in person appointments. The patient expressed understanding and agreed to proceed with the virtual visit  Staff also discussed with the patient that there may be a patient responsible charge related to this service. Patient Location: at home  Provider Location: [facility] Additional Individuals present: alone   HPI  Diagnosed on 07/11/2023  She initially had nasal congestion, facial pressure, chills and fever, yesterday she felt very dizzy and made the appointment, but she has a history of vertigo and took a meclizine and is feeling much better today. She is still feeling tired and has some nasal congestion but other symptoms resolved. She never had SOB , cough or wheezing. She has a pulse oximeter at home but has not checked it   Patient Active Problem List   Diagnosis Date Noted   Atherosclerosis of aorta (HCC) 04/03/2022   Adrenal adenoma, right 04/03/2022   Multinodular goiter 09/13/2020   H/O arthroscopy of right knee 01/14/2019   Morbid obesity (HCC) 01/14/2019   Mood swings 01/14/2019   Hot flushes, perimenopausal 01/14/2019   Tobacco use disorder 01/14/2019   Gastroesophageal reflux disease without esophagitis 01/14/2019   Mixed hyperlipidemia 07/29/2018   Vertigo 02/06/2018   COPD (chronic obstructive pulmonary disease) (HCC) 12/20/2017   Essential hypertension 12/20/2017    Past Surgical History:  Procedure Laterality Date   CHOLECYSTECTOMY     CHONDROPLASTY Right 12/09/2018   Procedure: CHONDROPLASTY LATERAL FEMORAL CONDYLE;  Surgeon: Juanell Fairly, MD;  Location: [facility] ORS;  Service: Orthopedics;  Laterality: Right;   ESOPHAGOGASTRODUODENOSCOPY (EGD) WITH PROPOFOL N/A 08/06/2019   Procedure: ESOPHAGOGASTRODUODENOSCOPY (EGD) WITH PROPOFOL;  Surgeon: Wyline Mood, MD;  Location: [facility] ENDOSCOPY;  Service: Gastroenterology;  Laterality: N/A;   FOREIGN BODY REMOVAL N/A 07/15/2016   Procedure: FOREIGN BODY REMOVAL;  Surgeon: Midge Minium, MD;  Location: [facility] ENDOSCOPY;  Service: Endoscopy;  Laterality: N/A;   KNEE ARTHROSCOPY Right 12/09/2018   Procedure: ARTHROSCOPY KNEE WITH PARTIAL LATERAL MENISCECTOMY;  Surgeon: Juanell Fairly, MD;  Location: [facility] ORS;  Service: Orthopedics;  Laterality: Right;   TOTAL ABDOMINAL HYSTERECTOMY      Family History  Problem Relation Age of Onset   Alcohol abuse Mother    Heart disease Father 89   Hypertension Father    Hypertension Brother    Hyperlipidemia Brother    Heart disease Brother 79   Asthma Maternal Grandmother    Stroke Maternal Grandfather    Hypertension Maternal Grandfather    Congestive Heart Failure Paternal Grandmother    Heart disease Paternal Grandfather    Congestive Heart Failure Paternal Grandfather     Social History   Socioeconomic History   Marital status: Single    Spouse name: Not on file   Number of children: 3   Years of education: Not on file   Highest education level: Associate degree: occupational, Scientist, product/process development, or vocational program  Occupational History   Occupation: CNA  Tobacco Use   Smoking status: Every Day    Current packs/day: 1.00    Average packs/day: 1 pack/day for 35.4  years (35.4 ttl pk-yrs)    Types: Cigarettes    Start date: 03/09/1988    Passive exposure: Current   Smokeless tobacco: Never   Tobacco comments:    0.5PPD 11/23/2023  Vaping Use   Vaping status: Never Used  Substance and Sexual Activity   Alcohol use: No   Drug use: No   Sexual activity: Yes  Other Topics Concern   Not on file  Social History Narrative   Lives with  father ( takes care of him) older son also at home    Works full time as Lawyer   Social Determinants of Corporate investment banker Strain: Low Risk  (04/03/2022)   Overall Financial Resource Strain (CARDIA)    Difficulty of Paying Living Expenses: Not very hard  Food Insecurity: No Food Insecurity (04/03/2022)   Hunger Vital Sign    Worried About Running Out of Food in the Last Year: Never true    Ran Out of Food in the Last Year: Never true  Transportation Needs: No Transportation Needs (04/03/2022)   PRAPARE - Administrator, Civil Service (Medical): No    Lack of Transportation (Non-Medical): No  Physical Activity: Sufficiently Active (04/03/2022)   Exercise Vital Sign    Days of Exercise per Week: 5 days    Minutes of Exercise per Session: 60 min  Stress: No Stress Concern Present (04/03/2022)   [employer] of Occupational Health - Occupational Stress Questionnaire    Feeling of Stress : Not at all  Social Connections: Moderately Integrated (04/03/2022)   Social Connection and Isolation Panel [NHANES]    Frequency of Communication with Friends and Family: More than three times a week    Frequency of Social Gatherings with Friends and Family: Once a week    Attends Religious Services: More than 4 times per year    Active Member of [employer] or Organizations: Yes    Attends Banker Meetings: More than 4 times per year    Marital Status: Never married  Intimate Partner Violence: Not At Risk (04/03/2022)   Humiliation, Afraid, Rape, and Kick questionnaire    Fear of Current or Ex-Partner: No    Emotionally Abused: No    Physically Abused: No    Sexually Abused: No     Current Outpatient Medications:    albuterol (PROVENTIL) (2.5 MG/3ML) 0.083% nebulizer solution, Take 3 mLs (2.5 mg total) by nebulization every 6 (six) hours as needed for wheezing or shortness of breath., Disp: 150 mL, Rfl: 1   albuterol (VENTOLIN HFA) 108 (90 Base) MCG/ACT inhaler, INHALE 2 PUFFS  BY MOUTH EVERY 6 HOURS AS NEEDED FOR WHEEZING FOR SHORTNESS OF BREATH, Disp: 9 g, Rfl: 0   azelastine (ASTELIN) 0.1 % nasal spray, Place 2 sprays into both nostrils 2 (two) times daily. Use in each nostril as directed, Disp: 30 mL, Rfl: 2   Cyanocobalamin (VITAMIN B-12 PO), Take by mouth daily., Disp: , Rfl:    fluticasone (FLONASE) 50 MCG/ACT nasal spray, Place 2 sprays into both nostrils daily., Disp: 16 g, Rfl: 0   hydrochlorothiazide (HYDRODIURIL) 25 MG tablet, TAKE 1 TABLET(25 MG) BY MOUTH DAILY, Disp: 90 tablet, Rfl: 0   loratadine (CLARITIN) 10 MG tablet, Take 10 mg by mouth daily., Disp: , Rfl:    meclizine (ANTIVERT) 25 MG tablet, Take 0.5-1 tablets (12.5-25 mg total) by mouth 2 (two) times daily as needed for dizziness., Disp: 100 tablet, Rfl: 0   Multiple Vitamin (MULTIVITAMIN) capsule, Take 1  capsule by mouth daily., Disp: , Rfl:    omeprazole (PRILOSEC) 40 MG capsule, Take 1 capsule (40 mg total) by mouth daily., Disp: 90 capsule, Rfl: 1   promethazine-dextromethorphan (PROMETHAZINE-DM) 6.25-15 MG/5ML syrup, Take 5 mLs by mouth 4 (four) times daily as needed for cough., Disp: 118 mL, Rfl: 0   rosuvastatin (CRESTOR) 10 MG tablet, Take 1 tablet (10 mg total) by mouth daily., Disp: 90 tablet, Rfl: 1   SYMBICORT 160-4.5 MCG/ACT inhaler, Inhale 2 puffs into the lungs 2 (two) times daily., Disp: 10.2 g, Rfl: 6   VITAMIN D PO, Take by mouth daily., Disp: , Rfl:    Zinc 50 MG TABS, Take by mouth., Disp: , Rfl:   No Known Allergies  I personally reviewed active problem list, medication list, allergies, family history, social history, health maintenance with the patient/caregiver today.   ROS  Ten systems reviewed and is negative except as mentioned in HPI    Objective  Virtual encounter, vitals not obtained.  Physical Exam  Awake, alert and oriented, no distress   PHQ2/9:    07/17/2023    8:09 AM 04/19/2023    9:35 AM 07/25/2022    3:55 PM 07/25/2022    3:53 PM 04/03/2022    3:23  PM  Depression screen PHQ 2/9  Decreased Interest 0 0 0 0 0  Down, Depressed, Hopeless 0 0 0 0 0  PHQ - 2 Score 0 0 0 0 0  Altered sleeping 0 0 0 0 0  Tired, decreased energy 0 0 0 0 0  Change in appetite 0 0 0 0 0  Feeling bad or failure about yourself  0 0 0 0 0  Trouble concentrating 0 0 0 0 0  Moving slowly or fidgety/restless 0 0 0 0 0  Suicidal thoughts 0 0 0 0 0  PHQ-9 Score 0 0 0 0 0  Difficult doing work/chores Not difficult at all Not difficult at all Not difficult at all Not difficult at all    PHQ-2/9 Result is negative.    Fall Risk:    07/17/2023    8:09 AM 04/19/2023    9:35 AM 07/25/2022    3:55 PM 07/25/2022    3:53 PM 04/03/2022    3:23 PM  Fall Risk   Falls in the past year? 0 0 0 0 0  Number falls in past yr: 0 0 0 0 0  Injury with Fall? 0 0 0 0 0  Risk for fall due to : No Fall Risks   No Fall Risks No Fall Risks  Follow up Falls prevention discussed;Education provided;Falls evaluation completed   Education provided;Falls prevention discussed Falls prevention discussed     Assessment & Plan  1. COVID-19  She is feeling much better, discussed adding nasal saline and resuming flonase nasal spray, drink fluids to see if fatigue will improve and contact us back if no improvement    I discussed the assessment and treatment plan with the patient. The patient was provided an opportunity to ask questions and all were answered. The patient agreed with the plan and demonstrated an understanding of the instructions.  The patient was advised to call back or seek an in-person evaluation if the symptoms worsen or if the condition fails to improve as anticipated.  I provided 15  minutes of non-face-to-face time during this encounter.

## 2023-07-16 NOTE — Telephone Encounter (Signed)
Summary: covid test positive   Pt states that she tested positive for Covid last Thursday and she is still having nasal and head congestion. Pt is wanting to see if PCP can call her in some medication. Please advise.     [facility] [address] [place]), [place] - [address] Phone: [phone] Fax: [phone]         Chief Complaint: Covid Positive Symptoms: Stuffy nose, head congestion, ear fullness.States subjective "LGT." Mild cough, yellowish phlegm in mornings. Fatigue Frequency: Thursday 07/11/23, states subjective "LGT." Pertinent Negatives: Patient denies muscle aches "No longer" Disposition: [] ED /[] Urgent Care (no appt availability in office) / [] Appointment(In office/virtual)/ []  [place] [facility]/ [] Home Care/ [] Refused Recommended Disposition /[] [place] [facility]/ [x]  Follow-up with PCP Additional Notes: Pt is requesting paxlovid. No available VV within timeframe. Home care advise provided, pt verbalizes understanding. Please advise regarding Paxlovid. States can do a tele visit if needed, available.  Reason for Disposition  [1] HIGH RISK patient (e.g., weak immune system, age > 64 years, obesity with BMI 30 or higher, pregnant, chronic lung disease or other chronic medical condition) AND [2] COVID symptoms (e.g., cough, fever)  (Exceptions: Already seen by PCP and no new or worsening symptoms.)  Answer Assessment - Initial Assessment Questions 1. COVID-19 DIAGNOSIS: "How do you know that you have COVID?" (e.g., positive lab test or self-test, diagnosed by doctor or NP/PA, symptoms after exposure).     Home test 2. COVID-19 EXPOSURE: "Was there any known exposure to COVID before the symptoms began?" CDC Definition of close contact: within 6 feet (2 meters) for a total of 15 minutes or more over a 24-hour period.      no 3. ONSET: "When did the COVID-19 symptoms start?"      8/15, Thursday 4. WORST SYMPTOM: "What is your worst symptom?"  (e.g., cough, fever, shortness of breath, muscle aches)     Nasals stuffiness 5. COUGH: "Do you have a cough?" If Yes, ask: "How bad is the cough?"       Mild cough, yellow in mornings 6. FEVER: "Do you have a fever?" If Yes, ask: "What is your temperature, how was it measured, and when did it start?"     Subjective LGT 7. RESPIRATORY STATUS: "Describe your breathing?" (e.g., normal; shortness of breath, wheezing, unable to speak)      No 8. BETTER-SAME-WORSE: "Are you getting better, staying the same or getting worse compared to yesterday?"  If getting worse, ask, "In what way?"     NA 9. OTHER SYMPTOMS: "Do you have any other symptoms?"  (e.g., chills, fatigue, headache, loss of smell or taste, muscle pain, sore throat)     Nasal stuffiness, head congestion, sinus pressure, left ear clogged 10. HIGH RISK DISEASE: "Do you have any chronic medical problems?" (e.g., asthma, heart or lung disease, weak immune system, obesity, etc.)        11. VACCINE: "Have you had the COVID-19 vaccine?" If Yes, ask: "Which one, how many shots, when did you get it?"       First one in 2021  Protocols used: Coronavirus (COVID-19) Diagnosed or Suspected-A-AH

## 2023-07-16 NOTE — Telephone Encounter (Signed)
Pt is scheduled for a virtual on 07/17/23

## 2023-07-17 ENCOUNTER — Telehealth: Payer: BC Managed Care – PPO | Admitting: Family Medicine

## 2023-07-17 ENCOUNTER — Encounter: Payer: Self-pay | Admitting: Family Medicine

## 2023-07-17 DIAGNOSIS — U071 COVID-19: Secondary | ICD-10-CM

## 2023-08-06 NOTE — Progress Notes (Deleted)
Name: Joan Davis   MRN: [id number]    DOB: 09/09/1966   Date:08/06/2023       Progress Note  Subjective  Chief Complaint  Follow Up  HPI  Morbid obesity: . She states she has been obese all her life, since childhood. Her heaviest was 309 lbs in 2020 She states at the time she changed her eating habits - avoiding bread, desserts and also going to the gym daily. She lost down to 268 lbs Currently on Golo but not losing any weight, she was seen  March  2023 at a weight of 272.7 lbs and was  frustrated because she cannot lose any extra pounds, so we started her on Wegovy and today weight is down to 262 lbs . She has right knee and lower back pain. We will adjust dose today    GERD: takes PPI intermittent, she had an episode about 6 weeks ago of substernal chest pain, that was constant , dull like that lasted for 2 weeks, not associated with diaphoresis, nausea  or vomiting. She took omeprazole and finally resolved.  Discussed results of CT lung and coronary disease. She denies SOB and states pain was not triggered by food or activity was constant and tender to touch. - it may be costochondritis but symptoms returns she needs to come in, also discussed when to call 911    Dyslipidemia/Atherosclerosis/CAD : discussed results below, she was given Crestor last year but is not taking medication , discussed CT scan and plaque formations, she is willing to try taking it. She will return in about 4-6 weeks to recheck labs    The 10-year ASCVD risk score (Arnett DK, et al., 2019) is: 12%   Values used to calculate the score:     Age: 57 years     Sex: Female     Is Non-Hispanic African American: Yes     Diabetic: No     Tobacco smoker: Yes     Systolic Blood Pressure: 126 mmHg     Is BP treated: Yes     HDL Cholesterol: 48 mg/dL     Total Cholesterol: 237 mg/dL    HTN: she has been taking HCTZ no side effects, no chest pain or palpitation.   Vertigo: seen by ENT years ago, still gets vertigo  when turning in bed to the right side, wakes her up but no nausea or vomiting and able to fall back asleep, she has a balanced with ENT and does not want to go back at this time    Chronic bronchitis /Emphysema: she had CT chest done and showed emphysematous changes. She denies cough , wheezing or sob. She    Goiter: following up with Endo , last TSH slightly suppressed but still not on medication   Patient Active Problem List   Diagnosis Date Noted   Atherosclerosis of aorta (HCC) 04/03/2022   Adrenal adenoma, right 04/03/2022   Multinodular goiter 09/13/2020   H/O arthroscopy of right knee 01/14/2019   Morbid obesity (HCC) 01/14/2019   Mood swings 01/14/2019   Hot flushes, perimenopausal 01/14/2019   Tobacco use disorder 01/14/2019   Gastroesophageal reflux disease without esophagitis 01/14/2019   Mixed hyperlipidemia 07/29/2018   Vertigo 02/06/2018   COPD (chronic obstructive pulmonary disease) (HCC) 12/20/2017   Essential hypertension 12/20/2017    Past Surgical History:  Procedure Laterality Date   CHOLECYSTECTOMY     CHONDROPLASTY Right 12/09/2018   Procedure: CHONDROPLASTY LATERAL FEMORAL CONDYLE;  Surgeon: Juanell Fairly, MD;  Location: [facility] ORS;  Service: Orthopedics;  Laterality: Right;   ESOPHAGOGASTRODUODENOSCOPY (EGD) WITH PROPOFOL N/A 08/06/2019   Procedure: ESOPHAGOGASTRODUODENOSCOPY (EGD) WITH PROPOFOL;  Surgeon: Wyline Mood, MD;  Location: [facility] END[facility]COPY;  Service: Gastroenterology;  Laterality: N/A;   FOREIGN BODY REMOVAL N/A 07/15/2016   Procedure: FOREIGN BODY REMOVAL;  Surgeon: Midge Minium, MD;  Location: [facility] ENDOSCOPY;  Service: Endoscopy;  Laterality: N/A;   KNEE ARTHROSCOPY Right 12/09/2018   Procedure: ARTHROSCOPY KNEE WITH PARTIAL LATERAL MENISCECTOMY;  Surgeon: Juanell Fairly, MD;  Location: [facility] ORS;  Service: Orthopedics;  Laterality: Right;   TOTAL ABDOMINAL HYSTERECTOMY      Family History  Problem Relation Age of Onset   Alcohol abuse Mother     Heart disease Father 40   Hypertension Father    Hypertension Brother    Hyperlipidemia Brother    Heart disease Brother 57   Asthma Maternal Grandmother    Stroke Maternal Grandfather    Hypertension Maternal Grandfather    Congestive Heart Failure Paternal Grandmother    Heart disease Paternal Grandfather    Congestive Heart Failure Paternal Grandfather     Social History   Tobacco Use   Smoking status: Every Day    Current packs/day: 1.00    Average packs/day: 1 pack/day for 35.4 years (35.4 ttl pk-yrs)    Types: Cigarettes    Start date: 03/09/1988    Passive exposure: Current   Smokeless tobacco: Never   Tobacco comments:    0.5PPD 11/23/2023  Substance Use Topics   Alcohol use: No     Current Outpatient Medications:    albuterol (PROVENTIL) (2.5 MG/3ML) 0.083% nebulizer solution, Take 3 mLs (2.5 mg total) by nebulization every 6 (six) hours as needed for wheezing or shortness of breath., Disp: 150 mL, Rfl: 1   albuterol (VENTOLIN HFA) 108 (90 Base) MCG/ACT inhaler, INHALE 2 PUFFS BY MOUTH EVERY 6 HOURS AS NEEDED FOR WHEEZING FOR SHORTNESS OF BREATH, Disp: 9 g, Rfl: 0   azelastine (ASTELIN) 0.1 % nasal spray, Place 2 sprays into both nostrils 2 (two) times daily. Use in each nostril as directed, Disp: 30 mL, Rfl: 2   Cyanocobalamin (VITAMIN B-12 PO), Take by mouth daily., Disp: , Rfl:    fluticasone (FLONASE) 50 MCG/ACT nasal spray, Place 2 sprays into both nostrils daily., Disp: 16 g, Rfl: 0   hydrochlorothiazide (HYDRODIURIL) 25 MG tablet, TAKE 1 TABLET(25 MG) BY MOUTH DAILY, Disp: 90 tablet, Rfl: 0   loratadine (CLARITIN) 10 MG tablet, Take 10 mg by mouth daily., Disp: , Rfl:    meclizine (ANTIVERT) 25 MG tablet, Take 0.5-1 tablets (12.5-25 mg total) by mouth 2 (two) times daily as needed for dizziness., Disp: 100 tablet, Rfl: 0   Multiple Vitamin (MULTIVITAMIN) capsule, Take 1 capsule by mouth daily., Disp: , Rfl:    omeprazole (PRILOSEC) 40 MG capsule, Take 1 capsule  (40 mg total) by mouth daily., Disp: 90 capsule, Rfl: 1   promethazine-dextromethorphan (PROMETHAZINE-DM) 6.25-15 MG/5ML syrup, Take 5 mLs by mouth 4 (four) times daily as needed for cough., Disp: 118 mL, Rfl: 0   rosuvastatin (CRESTOR) 10 MG tablet, Take 1 tablet (10 mg total) by mouth daily., Disp: 90 tablet, Rfl: 1   SYMBICORT 160-4.5 MCG/ACT inhaler, Inhale 2 puffs into the lungs 2 (two) times daily., Disp: 10.2 g, Rfl: 6   VITAMIN D PO, Take by mouth daily., Disp: , Rfl:    Zinc 50 MG TABS, Take by mouth., Disp: , Rfl:   No Known Allergies  I personally reviewed active problem list, medication list, allergies, family history, social history, health maintenance with the patient/caregiver today.   ROS  ***  Objective  There were no vitals filed for this visit.  There is no height or weight on file to calculate BMI.  Physical Exam ***  No results found for this or any previous visit (from the past 2160 hour(s)).   PHQ2/9:    07/17/2023    8:09 AM 04/19/2023    9:35 AM 07/25/2022    3:55 PM 07/25/2022    3:53 PM 04/03/2022    3:23 PM  Depression screen PHQ 2/9  Decreased Interest 0 0 0 0 0  Down, Depressed, Hopeless 0 0 0 0 0  PHQ - 2 Score 0 0 0 0 0  Altered sleeping 0 0 0 0 0  Tired, decreased energy 0 0 0 0 0  Change in appetite 0 0 0 0 0  Feeling bad or failure about yourself  0 0 0 0 0  Trouble concentrating 0 0 0 0 0  Moving slowly or fidgety/restless 0 0 0 0 0  Suicidal thoughts 0 0 0 0 0  PHQ-9 Score 0 0 0 0 0  Difficult doing work/chores Not difficult at all Not difficult at all Not difficult at all Not difficult at all     phq 9 is {gen pos [id number]}   Fall Risk:    07/17/2023    8:09 AM 04/19/2023    9:35 AM 07/25/2022    3:55 PM 07/25/2022    3:53 PM 04/03/2022    3:23 PM  Fall Risk   Falls in the past year? 0 0 0 0 0  Number falls in past yr: 0 0 0 0 0  Injury with Fall? 0 0 0 0 0  Risk for fall due to : No Fall Risks   No Fall Risks No Fall Risks   Follow up Falls prevention discussed;Education provided;Falls evaluation completed   Education provided;Falls prevention discussed Falls prevention discussed      Functional Status Survey:      Assessment & Plan  *** There are no diagnoses linked to this encounter.

## 2023-08-07 ENCOUNTER — Ambulatory Visit: Payer: Self-pay | Admitting: Family Medicine

## 2023-08-07 DIAGNOSIS — F17200 Nicotine dependence, unspecified, uncomplicated: Secondary | ICD-10-CM

## 2023-08-07 DIAGNOSIS — Z1231 Encounter for screening mammogram for malignant neoplasm of breast: Secondary | ICD-10-CM

## 2023-08-07 DIAGNOSIS — Z1211 Encounter for screening for malignant neoplasm of colon: Secondary | ICD-10-CM

## 2024-02-17 DIAGNOSIS — F17210 Nicotine dependence, cigarettes, uncomplicated: Secondary | ICD-10-CM | POA: Diagnosis not present

## 2024-02-17 DIAGNOSIS — E785 Hyperlipidemia, unspecified: Secondary | ICD-10-CM | POA: Diagnosis not present

## 2024-02-17 DIAGNOSIS — R509 Fever, unspecified: Secondary | ICD-10-CM | POA: Diagnosis not present

## 2024-02-17 DIAGNOSIS — I10 Essential (primary) hypertension: Secondary | ICD-10-CM | POA: Diagnosis not present

## 2024-02-17 DIAGNOSIS — R062 Wheezing: Secondary | ICD-10-CM | POA: Diagnosis not present

## 2024-02-17 DIAGNOSIS — J441 Chronic obstructive pulmonary disease with (acute) exacerbation: Secondary | ICD-10-CM | POA: Diagnosis not present

## 2024-02-17 DIAGNOSIS — R197 Diarrhea, unspecified: Secondary | ICD-10-CM | POA: Diagnosis not present

## 2024-02-17 DIAGNOSIS — R0602 Shortness of breath: Secondary | ICD-10-CM | POA: Diagnosis not present

## 2024-02-17 DIAGNOSIS — Z6841 Body Mass Index (BMI) 40.0 and over, adult: Secondary | ICD-10-CM | POA: Diagnosis not present

## 2024-02-17 DIAGNOSIS — R918 Other nonspecific abnormal finding of lung field: Secondary | ICD-10-CM | POA: Diagnosis not present

## 2024-02-17 DIAGNOSIS — R059 Cough, unspecified: Secondary | ICD-10-CM | POA: Diagnosis not present

## 2024-02-25 ENCOUNTER — Encounter: Admitting: Family Medicine

## 2024-02-25 ENCOUNTER — Other Ambulatory Visit: Payer: Self-pay

## 2024-02-25 DIAGNOSIS — Z122 Encounter for screening for malignant neoplasm of respiratory organs: Secondary | ICD-10-CM

## 2024-02-25 DIAGNOSIS — Z87891 Personal history of nicotine dependence: Secondary | ICD-10-CM

## 2024-02-25 DIAGNOSIS — F17210 Nicotine dependence, cigarettes, uncomplicated: Secondary | ICD-10-CM

## 2024-03-12 ENCOUNTER — Ambulatory Visit
Admission: RE | Admit: 2024-03-12 | Discharge: 2024-03-12 | Disposition: A | Discharge status: Home / Self Care | Payer: Self-pay | Source: Ambulatory Visit | Attending: Acute Care | Admitting: Acute Care

## 2024-03-12 DIAGNOSIS — Z87891 Personal history of nicotine dependence: Secondary | ICD-10-CM | POA: Diagnosis not present

## 2024-03-12 DIAGNOSIS — F17210 Nicotine dependence, cigarettes, uncomplicated: Secondary | ICD-10-CM | POA: Diagnosis not present

## 2024-03-12 DIAGNOSIS — Z122 Encounter for screening for malignant neoplasm of respiratory organs: Secondary | ICD-10-CM | POA: Insufficient documentation

## 2024-04-13 ENCOUNTER — Other Ambulatory Visit: Payer: Self-pay | Admitting: Acute Care

## 2024-04-13 ENCOUNTER — Encounter: Payer: Self-pay | Admitting: Family Medicine

## 2024-04-13 DIAGNOSIS — Z87891 Personal history of nicotine dependence: Secondary | ICD-10-CM

## 2024-04-13 DIAGNOSIS — Z122 Encounter for screening for malignant neoplasm of respiratory organs: Secondary | ICD-10-CM

## 2024-04-13 DIAGNOSIS — J479 Bronchiectasis, uncomplicated: Secondary | ICD-10-CM | POA: Insufficient documentation

## 2024-04-13 DIAGNOSIS — F17210 Nicotine dependence, cigarettes, uncomplicated: Secondary | ICD-10-CM

## 2024-04-29 ENCOUNTER — Encounter: Admitting: Family Medicine

## 2024-05-05 ENCOUNTER — Encounter: Admitting: Family Medicine

## 2024-05-11 DIAGNOSIS — Z1152 Encounter for screening for COVID-19: Secondary | ICD-10-CM | POA: Diagnosis not present

## 2024-05-11 DIAGNOSIS — I951 Orthostatic hypotension: Secondary | ICD-10-CM | POA: Diagnosis not present

## 2024-05-11 DIAGNOSIS — F17210 Nicotine dependence, cigarettes, uncomplicated: Secondary | ICD-10-CM | POA: Diagnosis not present

## 2024-05-11 DIAGNOSIS — Z8041 Family history of malignant neoplasm of ovary: Secondary | ICD-10-CM | POA: Diagnosis not present

## 2024-05-11 DIAGNOSIS — R42 Dizziness and giddiness: Secondary | ICD-10-CM | POA: Diagnosis not present

## 2024-05-11 DIAGNOSIS — R079 Chest pain, unspecified: Secondary | ICD-10-CM | POA: Diagnosis not present

## 2024-05-11 DIAGNOSIS — E785 Hyperlipidemia, unspecified: Secondary | ICD-10-CM | POA: Diagnosis not present

## 2024-05-11 DIAGNOSIS — Z6841 Body Mass Index (BMI) 40.0 and over, adult: Secondary | ICD-10-CM | POA: Diagnosis not present

## 2024-05-11 DIAGNOSIS — I10 Essential (primary) hypertension: Secondary | ICD-10-CM | POA: Diagnosis not present

## 2024-05-11 DIAGNOSIS — J449 Chronic obstructive pulmonary disease, unspecified: Secondary | ICD-10-CM | POA: Diagnosis not present

## 2024-05-12 DIAGNOSIS — I951 Orthostatic hypotension: Secondary | ICD-10-CM | POA: Diagnosis not present

## 2024-06-02 DIAGNOSIS — I10 Essential (primary) hypertension: Secondary | ICD-10-CM | POA: Diagnosis not present

## 2024-06-02 DIAGNOSIS — R918 Other nonspecific abnormal finding of lung field: Secondary | ICD-10-CM | POA: Diagnosis not present

## 2024-06-02 DIAGNOSIS — F17210 Nicotine dependence, cigarettes, uncomplicated: Secondary | ICD-10-CM | POA: Diagnosis not present

## 2024-06-02 DIAGNOSIS — R0602 Shortness of breath: Secondary | ICD-10-CM | POA: Diagnosis not present

## 2024-06-02 DIAGNOSIS — J441 Chronic obstructive pulmonary disease with (acute) exacerbation: Secondary | ICD-10-CM | POA: Diagnosis not present

## 2024-06-02 DIAGNOSIS — R062 Wheezing: Secondary | ICD-10-CM | POA: Diagnosis not present

## 2024-06-02 DIAGNOSIS — Z79899 Other long term (current) drug therapy: Secondary | ICD-10-CM | POA: Diagnosis not present

## 2024-06-02 DIAGNOSIS — R9431 Abnormal electrocardiogram [ECG] [EKG]: Secondary | ICD-10-CM | POA: Diagnosis not present

## 2024-06-02 DIAGNOSIS — Z20822 Contact with and (suspected) exposure to covid-19: Secondary | ICD-10-CM | POA: Diagnosis not present

## 2024-06-02 DIAGNOSIS — R0600 Dyspnea, unspecified: Secondary | ICD-10-CM | POA: Diagnosis not present

## 2024-06-02 DIAGNOSIS — E785 Hyperlipidemia, unspecified: Secondary | ICD-10-CM | POA: Diagnosis not present

## 2024-06-02 DIAGNOSIS — R0789 Other chest pain: Secondary | ICD-10-CM | POA: Diagnosis not present

## 2024-08-27 ENCOUNTER — Other Ambulatory Visit: Payer: Self-pay | Admitting: Family Medicine

## 2024-08-27 DIAGNOSIS — I10 Essential (primary) hypertension: Secondary | ICD-10-CM

## 2024-08-27 NOTE — Telephone Encounter (Unsigned)
 Copied from CRM #[id number]. Topic: Clinical - Medication Refill >> Aug 27, 2024 10:28 AM Fonda T wrote: Medication: hydrochlorothiazide  (HYDRODIURIL ) 25 MG tablet  Patient has upcoming appt, requesting a bridge refill to get her through to that appt scheduled on 10/02/24  Has the patient contacted their pharmacy? No, usually advises to contact office   This is the patient's preferred pharmacy:  [facility] [address]), [place] - [address] [place]) [place] [address] Phone: [phone] Fax: [phone]   Is this the correct pharmacy for this prescription? Yes If no, delete pharmacy and type the correct one.   Has the prescription been filled recently? Yes  Is the patient out of the medication? Yes  Has the patient been seen for an appointment in the last year OR does the patient have an upcoming appointment? Yes  Can we respond through MyChart? No, prefers call at [phone]  Agent: Please be advised that Rx refills may take up to 3 business days. We ask that you follow-up with your pharmacy.

## 2024-08-28 NOTE — Telephone Encounter (Signed)
 Requested medication (s) are due for refill today: yes  Requested medication (s) are on the active medication list: yes  Last refill:  04/03/22 #90  Future visit scheduled: no  Notes to clinic:  last ordered 2023   Requested Prescriptions  Pending Prescriptions Disp Refills   hydrochlorothiazide  (HYDRODIURIL ) 25 MG tablet 90 tablet 0    Sig: TAKE 1 TABLET(25 MG) BY MOUTH DAILY     Cardiovascular: Diuretics - Thiazide Failed - 08/28/2024  1:53 PM      Failed - Cr in normal range and within 180 days    Creat  Date Value Ref Range Status  05/09/2022 0.82 0.50 - 1.03 mg/dL Final         Failed - K in normal range and within 180 days    Potassium  Date Value Ref Range Status  05/09/2022 4.2 3.5 - 5.3 mmol/L Final  07/25/2013 3.5 3.5 - 5.1 mmol/L Final         Failed - Na in normal range and within 180 days    Sodium  Date Value Ref Range Status  05/09/2022 143 135 - 146 mmol/L Final  07/25/2013 141 136 - 145 mmol/L Final         Failed - Valid encounter within last 6 months    Recent Outpatient Visits   None            Passed - Last BP in normal range    BP Readings from Last 1 Encounters:  04/19/23 122/72

## 2024-10-01 NOTE — Patient Instructions (Signed)
 Preventive Care 58-57 Years Old, Female  Preventive care refers to lifestyle choices and visits with your health care provider that can promote health and wellness. Preventive care visits are also called wellness exams.  What can I expect for my preventive care visit?  Counseling  Your health care provider may ask you questions about your:  Medical history, including:  Past medical problems.  Family medical history.  Pregnancy history.  Current health, including:  Menstrual cycle.  Method of birth control.  Emotional well-being.  Home life and relationship well-being.  Sexual activity and sexual health.  Lifestyle, including:  Alcohol, nicotine or tobacco, and drug use.  Access to firearms.  Diet, exercise, and sleep habits.  Work and work Astronomer.  Sunscreen use.  Safety issues such as seatbelt and bike helmet use.  Physical exam  Your health care provider will check your:  Height and weight. These may be used to calculate your BMI (body mass index). BMI is a measurement that tells if you are at a healthy weight.  Waist circumference. This measures the distance around your waistline. This measurement also tells if you are at a healthy weight and may help predict your risk of certain diseases, such as type 2 diabetes and high blood pressure.  Heart rate and blood pressure.  Body temperature.  Skin for abnormal spots.  What immunizations do I need?    Vaccines are usually given at various ages, according to a schedule. Your health care provider will recommend vaccines for you based on your age, medical history, and lifestyle or other factors, such as travel or where you work.  What tests do I need?  Screening  Your health care provider may recommend screening tests for certain conditions. This may include:  Lipid and cholesterol levels.  Diabetes screening. This is done by checking your blood sugar (glucose) after you have not eaten for a while (fasting).  Pelvic exam and Pap test.  Hepatitis B test.  Hepatitis C  test.  HIV (human immunodeficiency virus) test.  STI (sexually transmitted infection) testing, if you are at risk.  Lung cancer screening.  Colorectal cancer screening.  Mammogram. Talk with your health care provider about when you should start having regular mammograms. This may depend on whether you have a family history of breast cancer.  BRCA-related cancer screening. This may be done if you have a family history of breast, ovarian, tubal, or peritoneal cancers.  Bone density scan. This is done to screen for osteoporosis.  Talk with your health care provider about your test results, treatment options, and if necessary, the need for more tests.  Follow these instructions at home:  Eating and drinking    Eat a diet that includes fresh fruits and vegetables, whole grains, lean protein, and low-fat dairy products.  Take vitamin and mineral supplements as recommended by your health care provider.  Do not drink alcohol if:  Your health care provider tells you not to drink.  You are pregnant, may be pregnant, or are planning to become pregnant.  If you drink alcohol:  Limit how much you have to 0-1 drink a day.  Know how much alcohol is in your drink. In the [place] one drink equals one 12 oz bottle of beer (355 mL), one 5 oz glass of wine (148 mL), or one 1 oz glass of hard liquor (44 mL).  Lifestyle  Brush your teeth every morning and night with fluoride toothpaste. Floss one time each day.  Exercise for at least  30 minutes 5 or more days each week.  Do not use any products that contain nicotine or tobacco. These products include cigarettes, chewing tobacco, and vaping devices, such as e-cigarettes. If you need help quitting, ask your health care provider.  Do not use drugs.  If you are sexually active, practice safe sex. Use a condom or other form of protection to prevent STIs.  If you do not wish to become pregnant, use a form of birth control. If you plan to become pregnant, see your health care provider for a  prepregnancy visit.  Take aspirin only as told by your health care provider. Make sure that you understand how much to take and what form to take. Work with your health care provider to find out whether it is safe and beneficial for you to take aspirin daily.  Find healthy ways to manage stress, such as:  Meditation, yoga, or listening to music.  Journaling.  Talking to a trusted person.  Spending time with friends and family.  Minimize exposure to UV radiation to reduce your risk of skin cancer.  Safety  Always wear your seat belt while driving or riding in a vehicle.  Do not drive:  If you have been drinking alcohol. Do not ride with someone who has been drinking.  When you are tired or distracted.  While texting.  If you have been using any mind-altering substances or drugs.  Wear a helmet and other protective equipment during sports activities.  If you have firearms in your house, make sure you follow all gun safety procedures.  Seek help if you have been physically or sexually abused.  What's next?  Visit your health care provider once a year for an annual wellness visit.  Ask your health care provider how often you should have your eyes and teeth checked.  Stay up to date on all vaccines.  This information is not intended to replace advice given to you by your health care provider. Make sure you discuss any questions you have with your health care provider.  Document Revised: 05/10/2021 Document Reviewed: 05/10/2021  Elsevier Patient Education  2024 [employer].

## 2024-10-02 ENCOUNTER — Ambulatory Visit: Admitting: Family Medicine

## 2024-10-02 ENCOUNTER — Encounter: Payer: Self-pay | Admitting: Family Medicine

## 2024-10-02 VITALS — BP 136/80 | HR 96 | Resp 16 | Ht 68.0 in | Wt 261.8 lb

## 2024-10-02 DIAGNOSIS — I10 Essential (primary) hypertension: Secondary | ICD-10-CM | POA: Diagnosis not present

## 2024-10-02 DIAGNOSIS — J479 Bronchiectasis, uncomplicated: Secondary | ICD-10-CM

## 2024-10-02 DIAGNOSIS — Z1211 Encounter for screening for malignant neoplasm of colon: Secondary | ICD-10-CM

## 2024-10-02 DIAGNOSIS — Z01411 Encounter for gynecological examination (general) (routine) with abnormal findings: Secondary | ICD-10-CM

## 2024-10-02 DIAGNOSIS — Z23 Encounter for immunization: Secondary | ICD-10-CM

## 2024-10-02 DIAGNOSIS — E559 Vitamin D deficiency, unspecified: Secondary | ICD-10-CM

## 2024-10-02 DIAGNOSIS — Z131 Encounter for screening for diabetes mellitus: Secondary | ICD-10-CM

## 2024-10-02 DIAGNOSIS — Z1159 Encounter for screening for other viral diseases: Secondary | ICD-10-CM

## 2024-10-02 DIAGNOSIS — E785 Hyperlipidemia, unspecified: Secondary | ICD-10-CM

## 2024-10-02 DIAGNOSIS — E538 Deficiency of other specified B group vitamins: Secondary | ICD-10-CM

## 2024-10-02 DIAGNOSIS — Z1231 Encounter for screening mammogram for malignant neoplasm of breast: Secondary | ICD-10-CM

## 2024-10-02 DIAGNOSIS — Z01419 Encounter for gynecological examination (general) (routine) without abnormal findings: Secondary | ICD-10-CM

## 2024-10-02 NOTE — Progress Notes (Signed)
 Name: Joan Davis   MRN: [id number]    DOB: 19-Jul-1966   Date:10/02/2024       Progress Note  Subjective  Chief Complaint  Chief Complaint  Patient presents with   Annual Exam    HPI  Patient presents for annual CPE.  Diet: cooking at home  Exercise:  discussed 30 minutes five days a week  Last Eye Exam: completed Last Dental Exam: encouraged to complete  Flowsheet Row Office Visit from 10/02/2024 in [facility]  AUDIT-C Score 0   Depression: Phq 9 is  positive - she has complicated grieving     10/02/2024    2:48 PM 07/17/2023    8:09 AM 04/19/2023    9:35 AM 07/25/2022    3:55 PM 07/25/2022    3:53 PM  Depression screen PHQ 2/9  Decreased Interest 1 0 0 0 0  Down, Depressed, Hopeless 1 0 0 0 0  PHQ - 2 Score 2 0 0 0 0  Altered sleeping 1 0 0 0 0  Tired, decreased energy 1 0 0 0 0  Change in appetite 0 0 0 0 0  Feeling bad or failure about yourself  0 0 0 0 0  Trouble concentrating 0 0 0 0 0  Moving slowly or fidgety/restless 0 0 0 0 0  Suicidal thoughts 0 0 0 0 0  PHQ-9 Score 4 0  0  0  0   Difficult doing work/chores Somewhat difficult Not difficult at all Not difficult at all Not difficult at all Not difficult at all     Data saved with a previous flowsheet row definition   Hypertension: BP Readings from Last 3 Encounters:  10/02/24 136/80  04/19/23 122/72  11/22/22 124/80   Obesity: Wt Readings from Last 3 Encounters:  10/02/24 261 lb 12.8 oz (118.8 kg)  03/12/24 251 lb (113.9 kg)  04/19/23 251 lb 4.8 oz (114 kg)   BMI Readings from Last 3 Encounters:  10/02/24 39.81 kg/m  03/12/24 38.16 kg/m  04/19/23 38.21 kg/m     Vaccines: reviewed with the patient.   Hep C Screening: completed STD testing and prevention (HIV/chl/gon/syphilis): N/A Intimate partner violence: negative screen  Sexual History :not sexually active in over 5 years  Menstrual History/LMP/Abnormal  s/p hysterectomy  Discussed importance of follow up  if any post-menopausal bleeding: not applicable  Incontinence Symptoms: positive for symptoms   Breast cancer:  - Last Mammogram: she will schedule it  - BRCA gene screening: N/A  Osteoporosis Prevention : Discussed high calcium  and vitamin D supplementation, weight bearing exercises Bone density : not interested   Cervical cancer screening: not applicable due to hysterectomy  Skin cancer: Discussed monitoring for atypical lesions  Colorectal cancer: she prefers cologuard   Lung cancer:  Low Dose CT Chest recommended if Age 2-80 years, 20 pack-year currently smoking OR have quit w/in 15years. Patient does qualify for screen   ECG: 2022  Advanced Care Planning: A voluntary discussion about advance care planning including the explanation and discussion of advance directives.  Discussed health care proxy and Living will, and the patient was able to identify a health care proxy as middle son - Venetia .  Patient does not have a living will and power of attorney of health care   Patient Active Problem List   Diagnosis Date Noted   Bronchiectasis (HCC) 04/13/2024   Atherosclerosis of aorta 04/03/2022   Adrenal adenoma, right 04/03/2022   Multinodular goiter 09/13/2020  H/O arthroscopy of right knee 01/14/2019   Morbid obesity (HCC) 01/14/2019   Mood swings 01/14/2019   Hot flushes, perimenopausal 01/14/2019   Tobacco use disorder 01/14/2019   Gastroesophageal reflux disease without esophagitis 01/14/2019   Mixed hyperlipidemia 07/29/2018   Vertigo 02/06/2018   COPD (chronic obstructive pulmonary disease) (HCC) 12/20/2017   Essential hypertension 12/20/2017    Past Surgical History:  Procedure Laterality Date   CHOLECYSTECTOMY     CHONDROPLASTY Right 12/09/2018   Procedure: CHONDROPLASTY LATERAL FEMORAL CONDYLE;  Surgeon: Marchia Drivers, MD;  Location: [facility] ORS;  Service: Orthopedics;  Laterality: Right;   ESOPHAGOGASTRODUODENOSCOPY (EGD) WITH PROPOFOL  N/A 08/06/2019   Procedure:  ESOPHAGOGASTRODUODENOSCOPY (EGD) WITH PROPOFOL ;  Surgeon: Therisa Bi, MD;  Location: [facility];  Service: Gastroenterology;  Laterality: N/A;   FOREIGN BODY REMOVAL N/A 07/15/2016   Procedure: FOREIGN BODY REMOVAL;  Surgeon: Rogelia Copping, MD;  Location: [facility] ENDOSCOPY;  Service: Endoscopy;  Laterality: N/A;   KNEE ARTHROSCOPY Right 12/09/2018   Procedure: ARTHROSCOPY KNEE WITH PARTIAL LATERAL MENISCECTOMY;  Surgeon: Marchia Drivers, MD;  Location: [facility] ORS;  Service: Orthopedics;  Laterality: Right;   TOTAL ABDOMINAL HYSTERECTOMY      Family History  Problem Relation Age of Onset   Alcohol abuse Mother    Heart disease Father 32   Hypertension Father    Hypertension Brother    Hyperlipidemia Brother    Heart disease Brother 47   Asthma Maternal Grandmother    Stroke Maternal Grandfather    Hypertension Maternal Grandfather    Congestive Heart Failure Paternal Grandmother    Heart disease Paternal Grandfather    Congestive Heart Failure Paternal Grandfather     Social History   Socioeconomic History   Marital status: Single    Spouse name: Not on file   Number of children: 3   Years of education: Not on file   Highest education level: Associate degree: occupational, scientist, product/process development, or vocational program  Occupational History   Occupation: CNA  Tobacco Use   Smoking status: Every Day    Current packs/day: 1.00    Average packs/day: 1 pack/day for 36.6 years (36.6 ttl pk-yrs)    Types: Cigarettes    Start date: 03/09/1988    Passive exposure: Current   Smokeless tobacco: Never   Tobacco comments:    0.5PPD 11/23/2023  Vaping Use   Vaping status: Never Used  Substance and Sexual Activity   Alcohol use: No   Drug use: No   Sexual activity: Not Currently  Other Topics Concern   Not on file  Social History Narrative   Lives with father ( takes care of him) older son also at home    Works full time as LAWYER   Social Drivers of Corporate Investment Banker Strain: High  Risk (10/02/2024)   Overall Financial Resource Strain (CARDIA)    Difficulty of Paying Living Expenses: Hard  Food Insecurity: Food Insecurity Present (10/02/2024)   Hunger Vital Sign    Worried About Running Out of Food in the Last Year: Often true    Ran Out of Food in the Last Year: Often true  Transportation Needs: No Transportation Needs (10/02/2024)   PRAPARE - Administrator, Civil Service (Medical): No    Lack of Transportation (Non-Medical): No  Physical Activity: Insufficiently Active (10/02/2024)   Exercise Vital Sign    Days of Exercise per Week: 2 days    Minutes of Exercise per Session: 60 min  Stress: Stress Concern Present (10/02/2024)  [employer] of Occupational Health - Occupational Stress Questionnaire    Feeling of Stress: Rather much  Social Connections: Moderately Isolated (10/02/2024)   Social Connection and Isolation Panel    Frequency of Communication with Friends and Family: More than three times a week    Frequency of Social Gatherings with Friends and Family: More than three times a week    Attends Religious Services: More than 4 times per year    Active Member of [employer] or Organizations: No    Attends Banker Meetings: Never    Marital Status: Never married  Intimate Partner Violence: Not At Risk (10/02/2024)   Humiliation, Afraid, Rape, and Kick questionnaire    Fear of Current or Ex-Partner: No    Emotionally Abused: No    Physically Abused: No    Sexually Abused: No     Current Outpatient Medications:    albuterol  (PROVENTIL ) (2.5 MG/3ML) 0.083% nebulizer solution, Take 3 mLs (2.5 mg total) by nebulization every 6 (six) hours as needed for wheezing or shortness of breath., Disp: 150 mL, Rfl: 1   albuterol  (VENTOLIN  HFA) 108 (90 Base) MCG/ACT inhaler, INHALE 2 PUFFS BY MOUTH EVERY 6 HOURS AS NEEDED FOR WHEEZING FOR SHORTNESS OF BREATH, Disp: 9 g, Rfl: 0   azelastine  (ASTELIN ) 0.1 % nasal spray, Place 2 sprays into both  nostrils 2 (two) times daily. Use in each nostril as directed, Disp: 30 mL, Rfl: 2   Cyanocobalamin (VITAMIN B-12 PO), Take by mouth daily., Disp: , Rfl:    fluticasone  (FLONASE ) 50 MCG/ACT nasal spray, Place 2 sprays into both nostrils daily., Disp: 16 g, Rfl: 0   hydrochlorothiazide  (HYDRODIURIL ) 25 MG tablet, TAKE 1 TABLET(25 MG) BY MOUTH DAILY, Disp: 90 tablet, Rfl: 0   loratadine (CLARITIN) 10 MG tablet, Take 10 mg by mouth daily., Disp: , Rfl:    meclizine  (ANTIVERT ) 25 MG tablet, Take 0.5-1 tablets (12.5-25 mg total) by mouth 2 (two) times daily as needed for dizziness., Disp: 100 tablet, Rfl: 0   Multiple Vitamin (MULTIVITAMIN) capsule, Take 1 capsule by mouth daily., Disp: , Rfl:    omeprazole  (PRILOSEC) 40 MG capsule, Take 1 capsule (40 mg total) by mouth daily., Disp: 90 capsule, Rfl: 1   promethazine -dextromethorphan (PROMETHAZINE -DM) 6.25-15 MG/5ML syrup, Take 5 mLs by mouth 4 (four) times daily as needed for cough., Disp: 118 mL, Rfl: 0   rosuvastatin  (CRESTOR ) 10 MG tablet, Take 1 tablet (10 mg total) by mouth daily., Disp: 90 tablet, Rfl: 1   SYMBICORT  160-4.5 MCG/ACT inhaler, Inhale 2 puffs into the lungs 2 (two) times daily., Disp: 10.2 g, Rfl: 6   VITAMIN D PO, Take by mouth daily., Disp: , Rfl:    Zinc  50 MG TABS, Take by mouth., Disp: , Rfl:   No Known Allergies   ROS  Constitutional: Negative for fever , positive for weight change.  Respiratory: positive  for cough no  shortness of breath.   Cardiovascular: Negative for chest pain or palpitations.  Gastrointestinal: Negative for abdominal pain, no bowel changes.  Urinary frequency Musculoskeletal: Negative for gait problem or joint swelling.  Skin: Negative for rash.  Neurological: Negative for dizziness or headache.  No other specific complaints in a complete review of systems (except as listed in HPI above).   Objective  Vitals:   10/02/24 1455  BP: 136/80  Pulse: 96  Resp: 16  SpO2: 99%  Weight: 261 lb  12.8 oz (118.8 kg)  Height: 5' 8 (1.727 m)  Body mass index is 39.81 kg/m.  Physical Exam  Constitutional: Patient appears well-developed and well-nourished. No distress.  HENT: Head: Normocephalic and atraumatic. Ears: B TMs ok, no erythema or effusion; Nose: Nose normal. Mouth/Throat: Oropharynx is clear and moist. No oropharyngeal exudate.  Eyes: Conjunctivae and EOM are normal. Pupils are equal, round, and reactive to light. No scleral icterus.  Neck: Normal range of motion. Neck supple. No JVD present. No thyromegaly present.  Cardiovascular: Normal rate, regular rhythm and normal heart sounds.  No murmur heard. No BLE edema. Pulmonary/Chest: Effort normal and breath sounds normal. No respiratory distress. Abdominal: Soft. Bowel sounds are normal, no distension. There is no tenderness. no masses Breast: no lumps or masses, no nipple discharge or rashes FEMALE GENITALIA:  Not done RECTAL: not done  Musculoskeletal: Normal range of motion, no joint effusions. No gross deformities Neurological: he is alert and oriented to person, place, and time. No cranial nerve deficit. Coordination, balance, strength, speech and gait are normal.  Skin: Skin is warm and dry. No rash noted. No erythema.  Psychiatric: Patient has a normal mood and affect. behavior is normal. Judgment and thought content normal.     Assessment & Plan  1. Well woman exam (Primary)  - MM 3D SCREENING MAMMOGRAM BILATERAL BREAST; Future - Cologuard - Hepatitis B Surface AntiBODY - Hemoglobin A1c - Lipid panel - Comprehensive metabolic panel with GFR - CBC with Differential/Platelet  2. Encounter for screening mammogram for malignant neoplasm of breast  - MM 3D SCREENING MAMMOGRAM BILATERAL BREAST; Future  3. Screening for colon cancer  - Cologuard  4. Diabetes mellitus screening  - Hemoglobin A1c  5. Essential hypertension  - Comprehensive metabolic panel with GFR - CBC with  Differential/Platelet  6. Dyslipidemia  - Lipid panel  7. Need for hepatitis B screening test  - Hepatitis B Surface AntiBODY  8. B12 deficiency  - B12 and Folate Panel  9. Vitamin D deficiency  - VITAMIN D 25 Hydroxy (Vit-D Deficiency, Fractures)  10. Bronchiectasis without complication (HCC)  Lung cancer screen up to date     -USPSTF grade A and B recommendations reviewed with patient; age-appropriate recommendations, preventive care, screening tests, etc discussed and encouraged; healthy living encouraged; see AVS for patient education given to patient -Discussed importance of 150 minutes of physical activity weekly, eat two servings of fish weekly, eat one serving of tree nuts ( cashews, pistachios, pecans, almonds.SABRA) every other day, eat 6 servings of fruit/vegetables daily and drink plenty of water and avoid sweet beverages.   -Reviewed Health Maintenance: Yes.

## 2024-10-05 DIAGNOSIS — Z131 Encounter for screening for diabetes mellitus: Secondary | ICD-10-CM | POA: Diagnosis not present

## 2024-10-05 DIAGNOSIS — Z01419 Encounter for gynecological examination (general) (routine) without abnormal findings: Secondary | ICD-10-CM | POA: Diagnosis not present

## 2024-10-05 DIAGNOSIS — Z1159 Encounter for screening for other viral diseases: Secondary | ICD-10-CM | POA: Diagnosis not present

## 2024-10-05 DIAGNOSIS — E538 Deficiency of other specified B group vitamins: Secondary | ICD-10-CM | POA: Diagnosis not present

## 2024-10-05 DIAGNOSIS — E559 Vitamin D deficiency, unspecified: Secondary | ICD-10-CM | POA: Diagnosis not present

## 2024-10-05 DIAGNOSIS — I10 Essential (primary) hypertension: Secondary | ICD-10-CM | POA: Diagnosis not present

## 2024-10-06 LAB — COMPREHENSIVE METABOLIC PANEL WITH GFR
AG Ratio: 1.5 (calc) (ref 1.0–2.5)
ALT: 14 U/L (ref 6–29)
AST: 13 U/L (ref 10–35)
Albumin: 4 g/dL (ref 3.6–5.1)
Alkaline phosphatase (APISO): 64 U/L (ref 37–153)
BUN/Creatinine Ratio: 14 (calc) (ref 6–22)
BUN: 17 mg/dL (ref 7–25)
CO2: 25 mmol/L (ref 20–32)
Calcium: 9.5 mg/dL (ref 8.6–10.4)
Chloride: 108 mmol/L (ref 98–110)
Creat: 1.18 mg/dL — ABNORMAL HIGH (ref 0.50–1.03)
Globulin: 2.7 g/dL (ref 1.9–3.7)
Glucose, Bld: 86 mg/dL (ref 65–99)
Potassium: 4.5 mmol/L (ref 3.5–5.3)
Sodium: 142 mmol/L (ref 135–146)
Total Bilirubin: 0.3 mg/dL (ref 0.2–1.2)
Total Protein: 6.7 g/dL (ref 6.1–8.1)
eGFR: 54 mL/min/1.73m2 — ABNORMAL LOW (ref >=60)

## 2024-10-06 LAB — CBC WITH DIFFERENTIAL/PLATELET
Absolute Lymphocytes: 2919 {cells}/uL (ref 850–3900)
Absolute Monocytes: 462 {cells}/uL (ref 200–950)
Basophils Absolute: 42 {cells}/uL (ref 0–200)
Basophils Relative: 0.6 %
Eosinophils Absolute: 476 {cells}/uL (ref 15–500)
Eosinophils Relative: 6.8 %
HCT: 45.3 % — ABNORMAL HIGH (ref 35.0–45.0)
Hemoglobin: 15.2 g/dL (ref 11.7–15.5)
MCH: 31.9 pg (ref 27.0–33.0)
MCHC: 33.6 g/dL (ref 32.0–36.0)
MCV: 95 fL (ref 80.0–100.0)
MPV: 10.4 fL (ref 7.5–12.5)
Monocytes Relative: 6.6 %
Neutro Abs: 3101 {cells}/uL (ref 1500–7800)
Neutrophils Relative %: 44.3 %
Platelets: 341 Thousand/uL (ref 140–400)
RBC: 4.77 Million/uL (ref 3.80–5.10)
RDW: 12.8 % (ref 11.0–15.0)
Total Lymphocyte: 41.7 %
WBC: 7 Thousand/uL (ref 3.8–10.8)

## 2024-10-06 LAB — VITAMIN D 25 HYDROXY (VIT D DEFICIENCY, FRACTURES): Vit D, 25-Hydroxy: 18 ng/mL — ABNORMAL LOW (ref 30–100)

## 2024-10-06 LAB — B12 AND FOLATE PANEL
Folate: 8.3 ng/mL
Vitamin B-12: 504 pg/mL (ref 200–1100)

## 2024-10-06 LAB — LIPID PANEL
Cholesterol: 200 mg/dL — ABNORMAL HIGH (ref <200)
HDL: 48 mg/dL — ABNORMAL LOW (ref >=50)
LDL Cholesterol (Calc): 125 mg/dL — ABNORMAL HIGH
Non-HDL Cholesterol (Calc): 152 mg/dL — ABNORMAL HIGH (ref <130)
Total CHOL/HDL Ratio: 4.2 (calc) (ref <5.0)
Triglycerides: 154 mg/dL — ABNORMAL HIGH (ref <150)

## 2024-10-06 LAB — HEPATITIS B SURFACE ANTIBODY,QUALITATIVE: Hep B S Ab: NONREACTIVE

## 2024-10-06 LAB — HEMOGLOBIN A1C
Hgb A1c MFr Bld: 5.4 % (ref <5.7)
Mean Plasma Glucose: 108 mg/dL
eAG (mmol/L): 6 mmol/L

## 2024-10-07 ENCOUNTER — Ambulatory Visit: Payer: Self-pay | Admitting: Family Medicine

## 2024-10-13 ENCOUNTER — Ambulatory Visit: Admitting: Family Medicine

## 2024-10-13 ENCOUNTER — Encounter: Payer: Self-pay | Admitting: Family Medicine

## 2024-10-13 VITALS — BP 144/82 | HR 94 | Resp 16 | Ht 68.0 in | Wt 259.1 lb

## 2024-10-13 DIAGNOSIS — J479 Bronchiectasis, uncomplicated: Secondary | ICD-10-CM

## 2024-10-13 DIAGNOSIS — J449 Chronic obstructive pulmonary disease, unspecified: Secondary | ICD-10-CM | POA: Diagnosis not present

## 2024-10-13 DIAGNOSIS — E559 Vitamin D deficiency, unspecified: Secondary | ICD-10-CM | POA: Diagnosis not present

## 2024-10-13 DIAGNOSIS — E538 Deficiency of other specified B group vitamins: Secondary | ICD-10-CM | POA: Diagnosis not present

## 2024-10-13 DIAGNOSIS — N3941 Urge incontinence: Secondary | ICD-10-CM

## 2024-10-13 DIAGNOSIS — I10 Essential (primary) hypertension: Secondary | ICD-10-CM

## 2024-10-13 DIAGNOSIS — E785 Hyperlipidemia, unspecified: Secondary | ICD-10-CM

## 2024-10-13 DIAGNOSIS — R002 Palpitations: Secondary | ICD-10-CM | POA: Diagnosis not present

## 2024-10-13 DIAGNOSIS — Z23 Encounter for immunization: Secondary | ICD-10-CM | POA: Diagnosis not present

## 2024-10-13 DIAGNOSIS — I471 Supraventricular tachycardia, unspecified: Secondary | ICD-10-CM | POA: Diagnosis not present

## 2024-10-13 DIAGNOSIS — I700 Atherosclerosis of aorta: Secondary | ICD-10-CM

## 2024-10-13 MED ORDER — VITAMIN D (ERGOCALCIFEROL) 1.25 MG (50000 UNIT) PO CAPS: 50000.0000 [IU] | ORAL_CAPSULE | ORAL | 1 refills | Status: AC

## 2024-10-13 MED ORDER — HYDROCHLOROTHIAZIDE 12.5 MG PO TABS: 12.5000 mg | ORAL_TABLET | ORAL | 0 refills | Status: AC

## 2024-10-13 MED ORDER — ATENOLOL 25 MG PO TABS: 25.0000 mg | ORAL_TABLET | Freq: Every evening | ORAL | 0 refills | Status: AC

## 2024-10-13 MED ORDER — SYMBICORT 160-4.5 MCG/ACT IN AERO: 2.0000 | INHALATION_SPRAY | Freq: Two times a day (BID) | RESPIRATORY_TRACT | 6 refills | Status: AC

## 2024-10-13 MED ORDER — ROSUVASTATIN CALCIUM 10 MG PO TABS: 10.0000 mg | ORAL_TABLET | Freq: Every day | ORAL | 1 refills | Status: AC

## 2024-10-13 MED ORDER — TOLTERODINE TARTRATE ER 2 MG PO CP24: 2.0000 mg | ORAL_CAPSULE | Freq: Every day | ORAL | 0 refills | Status: AC

## 2024-10-13 NOTE — Progress Notes (Signed)
 Name: Joan Davis   MRN: [id number]    DOB: 07-23-1966   Date:10/13/2024       Progress Note  Subjective  Chief Complaint  Chief Complaint  Patient presents with   Medical Management of Chronic Issues   Discussed the use of AI scribe software for clinical note transcription with the patient, who gave verbal consent to proceed.  History of Present Illness   Joan Davis is a 58 year old female with hypertension who presents for a follow-up visit.  She has been experiencing elevated blood pressure, with a recent reading of 160/86, which she attributes to stress from a recent visit to her lawyer's office. Her blood pressure is usually lower at home, around 136/80, when she is taking her medication. However, she has not been taking her prescribed hydrochlorothiazide  25 mg  due to a lack of refills.  She experiences heart palpitations, described as 'heart flutters all the time.' She recalls wearing a Zio patch in 2021 and has seen a cardiologist for this issue. She has not been prescribed a beta blocker in the past  She reports urinary urgency and sometimes experiences urge incontinence, occasionally not making it to the bathroom in time. She drinks over 24 ounces of Coke daily, which may contribute to her symptoms.  She has a history of low vitamin D levels, with a recent level of 18. She takes prescription vitamin D, 50,000 IU weekly. Her recent labs showed a GFR of 54.  Her cholesterol levels are not optimal, with low HDL, high triglycerides, and high LDL. She has not been taking her rosuvastatin  regularly. No current symptoms of reflux, heartburn, or indigestion and has stopped taking omeprazole .  She has a history of bronchiectasis and COPD. She has not seen her pulmonologist recently but uses Symbicort  for her symptoms. She brings up phlegm but does not regularly cough or wheeze. Albuterol  is not effective for her.  Her BMI is 39.4 . She has previously been on Wegovy  for weight  management but stopped due to availability issues. She is currently trying to watch her diet to manage her weight.    Patient Active Problem List   Diagnosis Date Noted   Dyslipidemia 10/02/2024   Bronchiectasis (HCC) 04/13/2024   Atherosclerosis of aorta 04/03/2022   Adrenal adenoma, right 04/03/2022   Multinodular goiter 09/13/2020   H/O arthroscopy of right knee 01/14/2019   Morbid obesity (HCC) 01/14/2019   Mood swings 01/14/2019   Hot flushes, perimenopausal 01/14/2019   Tobacco use disorder 01/14/2019   Gastroesophageal reflux disease without esophagitis 01/14/2019   Mixed hyperlipidemia 07/29/2018   Vertigo 02/06/2018   COPD (chronic obstructive pulmonary disease) (HCC) 12/20/2017   Essential hypertension 12/20/2017    Past Surgical History:  Procedure Laterality Date   CHOLECYSTECTOMY     CHONDROPLASTY Right 12/09/2018   Procedure: CHONDROPLASTY LATERAL FEMORAL CONDYLE;  Surgeon: Marchia Drivers, MD;  Location: [facility] ORS;  Service: Orthopedics;  Laterality: Right;   ESOPHAGOGASTRODUODENOSCOPY (EGD) WITH PROPOFOL  N/A 08/06/2019   Procedure: ESOPHAGOGASTRODUODENOSCOPY (EGD) WITH PROPOFOL ;  Surgeon: Therisa Bi, MD;  Location: [facility] ENDOSCOPY;  Service: Gastroenterology;  Laterality: N/A;   FOREIGN BODY REMOVAL N/A 07/15/2016   Procedure: FOREIGN BODY REMOVAL;  Surgeon: Rogelia Copping, MD;  Location: [facility] ENDOSCOPY;  Service: Endoscopy;  Laterality: N/A;   KNEE ARTHROSCOPY Right 12/09/2018   Procedure: ARTHROSCOPY KNEE WITH PARTIAL LATERAL MENISCECTOMY;  Surgeon: Marchia Drivers, MD;  Location: [facility] ORS;  Service: Orthopedics;  Laterality: Right;   TOTAL ABDOMINAL HYSTERECTOMY  Family History  Problem Relation Age of Onset   Alcohol abuse Mother    Heart disease Father 74   Hypertension Father    Hypertension Brother    Hyperlipidemia Brother    Heart disease Brother 79   Asthma Maternal Grandmother    Stroke Maternal Grandfather    Hypertension Maternal Grandfather     Congestive Heart Failure Paternal Grandmother    Heart disease Paternal Grandfather    Congestive Heart Failure Paternal Grandfather     Social History   Tobacco Use   Smoking status: Every Day    Current packs/day: 1.00    Average packs/day: 1 pack/day for 36.6 years (36.6 ttl pk-yrs)    Types: Cigarettes    Start date: 03/09/1988    Passive exposure: Current   Smokeless tobacco: Never   Tobacco comments:    0.5PPD 11/23/2023  Substance Use Topics   Alcohol use: No     Current Outpatient Medications:    albuterol  (VENTOLIN  HFA) 108 (90 Base) MCG/ACT inhaler, INHALE 2 PUFFS BY MOUTH EVERY 6 HOURS AS NEEDED FOR WHEEZING FOR SHORTNESS OF BREATH, Disp: 9 g, Rfl: 0   atenolol (TENORMIN) 25 MG tablet, Take 1 tablet (25 mg total) by mouth every evening., Disp: 90 tablet, Rfl: 0   azelastine  (ASTELIN ) 0.1 % nasal spray, Place 2 sprays into both nostrils 2 (two) times daily. Use in each nostril as directed, Disp: 30 mL, Rfl: 2   Cyanocobalamin (VITAMIN B-12 PO), Take by mouth daily., Disp: , Rfl:    fluticasone  (FLONASE ) 50 MCG/ACT nasal spray, Place 2 sprays into both nostrils daily., Disp: 16 g, Rfl: 0   loratadine (CLARITIN) 10 MG tablet, Take 10 mg by mouth daily., Disp: , Rfl:    meclizine  (ANTIVERT ) 25 MG tablet, Take 0.5-1 tablets (12.5-25 mg total) by mouth 2 (two) times daily as needed for dizziness., Disp: 100 tablet, Rfl: 0   Multiple Vitamin (MULTIVITAMIN) capsule, Take 1 capsule by mouth daily., Disp: , Rfl:    tolterodine (DETROL LA) 2 MG 24 hr capsule, Take 1 capsule (2 mg total) by mouth daily., Disp: 90 capsule, Rfl: 0   VITAMIN D PO, Take by mouth daily., Disp: , Rfl:    Vitamin D, Ergocalciferol, (DRISDOL) 1.25 MG (50000 UNIT) CAPS capsule, Take 1 capsule (50,000 Units total) by mouth every 7 (seven) days., Disp: 12 capsule, Rfl: 1   Zinc  50 MG TABS, Take by mouth., Disp: , Rfl:    hydrochlorothiazide  (HYDRODIURIL ) 12.5 MG tablet, Take 1 tablet (12.5 mg total) by mouth  every morning., Disp: 90 tablet, Rfl: 0   rosuvastatin  (CRESTOR ) 10 MG tablet, Take 1 tablet (10 mg total) by mouth daily., Disp: 90 tablet, Rfl: 1   SYMBICORT  160-4.5 MCG/ACT inhaler, Inhale 2 puffs into the lungs 2 (two) times daily., Disp: 10.2 g, Rfl: 6  No Known Allergies  I personally reviewed active problem list, medication list, allergies with the patient/caregiver today.   ROS  Ten systems reviewed and is negative except as mentioned in HPI    Objective Physical Exam VITALS: P- 94, BP- 160/86 MEASUREMENTS: BMI- 39.4. CONSTITUTIONAL: Patient appears well-developed and well-nourished. No distress. HEENT: Head atraumatic, normocephalic, neck supple. CARDIOVASCULAR: Normal rate, regular rhythm and normal heart sounds. No murmur heard. Trace edema in legs. PULMONARY: Effort normal and breath sounds normal. Lungs clear to auscultation. No respiratory distress. ABDOMINAL: There is no tenderness or distention. MUSCULOSKELETAL: Normal gait. Without gross motor or sensory deficit. PSYCHIATRIC: Patient has a normal mood and  affect. Behavior is normal. Judgment and thought content normal.  Vitals:   10/13/24 1255  BP: (!) 160/86  Pulse: 94  Resp: 16  SpO2: 97%  Weight: 259 lb 1.6 oz (117.5 kg)  Height: 5' 8 (1.727 m)    Body mass index is 39.4 kg/m.  Recent Results (from the past 2160 hours)  Hepatitis B Surface AntiBODY     Status: None   Collection Time: 10/05/24  2:05 PM  Result Value Ref Range   Hep B S Ab NON-REACTIVE NON-REACTIVE  Hemoglobin A1c     Status: None   Collection Time: 10/05/24  2:05 PM  Result Value Ref Range   Hgb A1c MFr Bld 5.4 <5.7 %    Comment: For the purpose of screening for the presence of diabetes: . <5.7%       Consistent with the absence of diabetes 5.7-6.4%    Consistent with increased risk for diabetes             (prediabetes) > or =6.5%  Consistent with diabetes . This assay result is consistent with a decreased risk of  diabetes. . Currently, no consensus exists regarding use of hemoglobin A1c for diagnosis of diabetes in children. . According to American Diabetes Association (ADA) guidelines, hemoglobin A1c <7.0% represents optimal control in non-pregnant diabetic patients. Different metrics may apply to specific patient populations.  Standards of Medical Care in Diabetes(ADA). .    Mean Plasma Glucose 108 mg/dL   eAG (mmol/L) 6.0 mmol/L  Lipid panel     Status: Abnormal   Collection Time: 10/05/24  2:05 PM  Result Value Ref Range   Cholesterol 200 (H) <200 mg/dL   HDL 48 (L) > OR = 50 mg/dL   Triglycerides 845 (H) <150 mg/dL   LDL Cholesterol (Calc) 125 (H) mg/dL (calc)    Comment: Reference range: <100 . Desirable range <100 mg/dL for primary prevention;   <70 mg/dL for patients with CHD or diabetic patients  with > or = 2 CHD risk factors. SABRA LDL-C is now calculated using the Martin-Hopkins  calculation, which is a validated novel method providing  better accuracy than the Friedewald equation in the  estimation of LDL-C.  Gladis APPLETHWAITE et al. SANDREA. 7986;689(80): 2061-2068  (http://education.[facility].com/faq/FAQ164)    Total CHOL/HDL Ratio 4.2 <5.0 (calc)   Non-HDL Cholesterol (Calc) 152 (H) <130 mg/dL (calc)    Comment: For patients with diabetes plus 1 major ASCVD risk  factor, treating to a non-HDL-C goal of <100 mg/dL  (LDL-C of <29 mg/dL) is considered a therapeutic  option.   Comprehensive metabolic panel with GFR     Status: Abnormal   Collection Time: 10/05/24  2:05 PM  Result Value Ref Range   Glucose, Bld 86 65 - 99 mg/dL    Comment: .            Fasting reference interval .    BUN 17 7 - 25 mg/dL   Creat 8.81 (H) 9.49 - 1.03 mg/dL   eGFR 54 (L) > OR = 60 mL/min/1.75m2   BUN/Creatinine Ratio 14 6 - 22 (calc)   Sodium 142 135 - 146 mmol/L   Potassium 4.5 3.5 - 5.3 mmol/L   Chloride 108 98 - 110 mmol/L   CO2 25 20 - 32 mmol/L   Calcium  9.5 8.6 - 10.4 mg/dL    Total Protein 6.7 6.1 - 8.1 g/dL   Albumin 4.0 3.6 - 5.1 g/dL   Globulin 2.7 1.9 - 3.7 g/dL (calc)  AG Ratio 1.5 1.0 - 2.5 (calc)   Total Bilirubin 0.3 0.2 - 1.2 mg/dL   Alkaline phosphatase (APISO) 64 37 - 153 U/L   AST 13 10 - 35 U/L   ALT 14 6 - 29 U/L  CBC with Differential/Platelet     Status: Abnormal   Collection Time: 10/05/24  2:05 PM  Result Value Ref Range   WBC 7.0 3.8 - 10.8 Thousand/uL   RBC 4.77 3.80 - 5.10 Million/uL   Hemoglobin 15.2 11.7 - 15.5 g/dL   HCT 54.6 (H) 64.9 - 54.9 %   MCV 95.0 80.0 - 100.0 fL   MCH 31.9 27.0 - 33.0 pg   MCHC 33.6 32.0 - 36.0 g/dL    Comment: For adults, a slight decrease in the calculated MCHC value (in the range of 30 to 32 g/dL) is most likely not clinically significant; however, it should be interpreted with caution in correlation with other red cell parameters and the patient's clinical condition.    RDW 12.8 11.0 - 15.0 %   Platelets 341 140 - 400 Thousand/uL   MPV 10.4 7.5 - 12.5 fL   Neutro Abs 3,101 1,500 - 7,800 cells/uL   Absolute Lymphocytes 2,919 850 - 3,900 cells/uL   Absolute Monocytes 462 200 - 950 cells/uL   Eosinophils Absolute 476 15 - 500 cells/uL   Basophils Absolute 42 0 - 200 cells/uL   Neutrophils Relative % 44.3 %   Total Lymphocyte 41.7 %   Monocytes Relative 6.6 %   Eosinophils Relative 6.8 %   Basophils Relative 0.6 %  VITAMIN D  25 Hydroxy (Vit-D Deficiency, Fractures)     Status: Abnormal   Collection Time: 10/05/24  2:05 PM  Result Value Ref Range   Vit D, 25-Hydroxy 18 (L) 30 - 100 ng/mL    Comment: Vitamin D  Status         25-OH Vitamin D : . Deficiency:                    <20 ng/mL Insufficiency:             20 - 29 ng/mL Optimal:                 > or = 30 ng/mL . For 25-OH Vitamin D  testing on patients on  D2-supplementation and patients for whom quantitation  of D2 and D3 fractions is required, the [facility](TM) 25-OH VIT D, (D2,D3), LC/MS/MS is recommended: order  code 07111  (patients >21yrs). . See Note 1 . Note 1 . For additional information, please refer to  http://education.[facility].com/faq/FAQ199  (This link is being provided for informational/ educational purposes only.)   B12 and Folate Panel     Status: None   Collection Time: 10/05/24  2:05 PM  Result Value Ref Range   Vitamin B-12 504 200 - 1,100 pg/mL   Folate 8.3 ng/mL    Comment:                            Reference Range                            Low:           <3.4                            Borderline:    3.4-5.4  Normal:        >5.4 .     Diabetic Foot Exam:     PHQ2/9:    10/13/2024   12:51 PM 10/02/2024    2:48 PM 07/17/2023    8:09 AM 04/19/2023    9:35 AM 07/25/2022    3:55 PM  Depression screen PHQ 2/9  Decreased Interest 1 1 0 0 0  Down, Depressed, Hopeless 1 1 0 0 0  PHQ - 2 Score 2 2 0 0 0  Altered sleeping 1 1 0 0 0  Tired, decreased energy 1 1 0 0 0  Change in appetite 0 0 0 0 0  Feeling bad or failure about yourself  0 0 0 0 0  Trouble concentrating 0 0 0 0 0  Moving slowly or fidgety/restless 0 0 0 0 0  Suicidal thoughts 0 0 0 0 0  PHQ-9 Score 4 4 0  0  0   Difficult doing work/chores Somewhat difficult Somewhat difficult Not difficult at all Not difficult at all Not difficult at all     Data saved with a previous flowsheet row definition    phq 9 is negative  Fall Risk:    10/13/2024   12:50 PM 10/02/2024    2:48 PM 07/17/2023    8:09 AM 04/19/2023    9:35 AM 07/25/2022    3:55 PM  Fall Risk   Falls in the past year? 0 0 0 0 0  Number falls in past yr: 0 0 0 0 0  Injury with Fall? 0 0 0 0 0  Risk for fall due to : No Fall Risks No Fall Risks No Fall Risks    Follow up Falls evaluation completed Falls evaluation completed Falls prevention discussed;Education provided;Falls evaluation completed        Assessment & Plan Essential hypertension Blood pressure elevated due to stress and lack of medication.  Previously controlled with medication. - Prescribed HCTZ 12.5 mg daily. - Scheduled blood pressure check with CMA in two weeks. - Scheduled follow-up appointment in three months.  Hyperlipidemia and atherosclerosis of aorta Cholesterol levels elevated with low HDL, high triglycerides, and LDL. Not taking rosuvastatin . Atherosclerosis present. - Prescribed rosuvastatin  daily. - Advised to avoid fried foods and increase physical activity.  Morbid obesity due to excess calories BMI remains high, above 35 with comorbidities. Previous weight loss medication discontinued. Insurance does not cover weight loss medications without diabetes diagnosis. - Discussed potential use of Zepbound through [facility] for weight loss or try Rybelsus once approved for weight loss since lower cost - Advised on dietary modifications and physical activity.  Chronic obstructive pulmonary disease and bronchiectasis COPD and bronchiectasis with recent CT scan showing bronchial disease. Not seeing pulmonologist regularly. - Referred to pulmonologist for follow-up. - Prescribed Symbicort  inhaler. - Advised on inhaler use: up to 8 puffs a day if needed.  Urge incontinence Experiencing urinary urgency and occasional incontinence. High caffeine  intake may contribute. - Prescribed Detrol LA 2 mg daily. - Advised to reduce caffeine  intake.  Palpitations and supraventricular tachycardia Experiencing heart fluttering. Previous evaluation noted supraventricular tachycardia. - Prescribed atenolol for palpitations and blood pressure control. - We will add Atenolol 25 mg to take qhs  Vitamin D deficiency Vitamin D levels consistently low. - Prescribed vitamin D 50,000 IU weekly.  General Health Maintenance Routine health maintenance discussed. - Advised to schedule mammogram. - Advised to complete Cologuard test.

## 2024-10-14 ENCOUNTER — Telehealth: Payer: Self-pay | Admitting: Pharmacy Technician

## 2024-10-14 ENCOUNTER — Other Ambulatory Visit (HOSPITAL_COMMUNITY): Payer: Self-pay

## 2024-10-14 NOTE — Telephone Encounter (Signed)
 Pharmacy Patient Advocate Encounter   Received notification from Onbase that prior authorization for Symbicort  160-4.5 mcg inhaler is required/requested.   Insurance verification completed.   The patient is insured through [place] and [place] [employer]ICA[employer]   Per test claim: The current 30 day co-pay is, $4.00.  No PA needed at this time. This test claim was processed through [facility]- copay amounts may vary at other pharmacies due to pharmacy/plan contracts, or as the patient moves through the different stages of their insurance plan.

## 2024-10-27 ENCOUNTER — Ambulatory Visit

## 2024-11-24 ENCOUNTER — Ambulatory Visit: Admitting: Nurse Practitioner

## 2024-12-03 ENCOUNTER — Telehealth: Payer: Self-pay

## 2024-12-03 NOTE — Telephone Encounter (Signed)
 Unable to prescribe due to not being a diabetic

## 2024-12-03 NOTE — Telephone Encounter (Unsigned)
 Copied from CRM [id number]. Topic: Clinical - Medication Question >> Dec 03, 2024 11:47 AM Kevelyn M wrote: Reason for CRM: Patient is requesting a prescription for Ozempic. She discussed taking this at the last appointment with Dr. Glenard.  Call back # [phone]

## 2024-12-15 ENCOUNTER — Ambulatory Visit (INDEPENDENT_AMBULATORY_CARE_PROVIDER_SITE_OTHER)

## 2024-12-15 DIAGNOSIS — Z23 Encounter for immunization: Secondary | ICD-10-CM

## 2024-12-16 ENCOUNTER — Encounter: Payer: Self-pay | Admitting: Family Medicine

## 2024-12-16 ENCOUNTER — Ambulatory Visit: Admitting: Family Medicine

## 2024-12-16 DIAGNOSIS — E785 Hyperlipidemia, unspecified: Secondary | ICD-10-CM | POA: Diagnosis not present

## 2024-12-16 DIAGNOSIS — Z7951 Long term (current) use of inhaled steroids: Secondary | ICD-10-CM | POA: Diagnosis not present

## 2024-12-16 DIAGNOSIS — J449 Chronic obstructive pulmonary disease, unspecified: Secondary | ICD-10-CM | POA: Diagnosis not present

## 2024-12-16 DIAGNOSIS — E559 Vitamin D deficiency, unspecified: Secondary | ICD-10-CM

## 2024-12-16 MED ORDER — ZEPBOUND 2.5 MG/0.5ML ~~LOC~~ SOAJ: 2.5000 mg | SUBCUTANEOUS | 0 refills | Status: AC

## 2024-12-16 NOTE — Progress Notes (Signed)
 Name: Joan Davis   MRN: [id number]    DOB: 09/28/1966   Date:12/16/2024       Progress Note  Subjective  Chief Complaint  Chief Complaint  Patient presents with   Weight Loss    Discuss meds   Discussed the use of AI scribe software for clinical note transcription with the patient, who gave verbal consent to proceed.  History of Present Illness Joan Davis is a 59 year old female with obesity who presents for weight management and consideration of GLP-1 agonist therapy.  She has struggled with weight issues since childhood, with her highest weight recorded at 310 pounds. She has managed to reduce her weight to 267 pounds through dietary changes, such as cutting out sugars, bread, and sweet beverages, and by increasing her physical activity. She has started walking daily for about 45 minutes, although she finds it challenging due to shortness of breath.  She previously tried Wegovy  for weight loss but did not find it effective, partly due to a shortage of the medication. It has been a couple of years since she last used it. She is considering other GLP-1 agonists for weight management.  She has a history of COPD and bronchitis, which contribute to her shortness of breath. She uses long-term inhalers and steroid therapy to manage her symptoms and is under the care of a specialist for these conditions.  She also has  hypertension, which is currently well-controlled with a blood pressure reading of 136/80 mmHg. Additionally, she has dyslipidemia, with high LDL cholesterol and low HDL cholesterol, for which she takes rosuvastatin  daily. Her kidney function has shown a slight decrease. Her vitamin D  levels are low, and she takes a supplement.    Patient Active Problem List   Diagnosis Date Noted   Dyslipidemia 10/02/2024   Bronchiectasis (HCC) 04/13/2024   Atherosclerosis of aorta 04/03/2022   Adrenal adenoma, right 04/03/2022   Multinodular goiter 09/13/2020   H/O arthroscopy of  right knee 01/14/2019   Morbid obesity (HCC) 01/14/2019   Mood swings 01/14/2019   Hot flushes, perimenopausal 01/14/2019   Tobacco use disorder 01/14/2019   Gastroesophageal reflux disease without esophagitis 01/14/2019   Mixed hyperlipidemia 07/29/2018   Vertigo 02/06/2018   COPD (chronic obstructive pulmonary disease) (HCC) 12/20/2017   Essential hypertension 12/20/2017    Past Surgical History:  Procedure Laterality Date   CHOLECYSTECTOMY     CHONDROPLASTY Right 12/09/2018   Procedure: CHONDROPLASTY LATERAL FEMORAL CONDYLE;  Surgeon: Marchia Drivers, MD;  Location: [facility] ORS;  Service: Orthopedics;  Laterality: Right;   ESOPHAGOGASTRODUODENOSCOPY (EGD) WITH PROPOFOL  N/A 08/06/2019   Procedure: ESOPHAGOGASTRODUODENOSCOPY (EGD) WITH PROPOFOL ;  Surgeon: Therisa Bi, MD;  Location: [facility] ENDOSCOPY;  Service: Gastroenterology;  Laterality: N/A;   FOREIGN BODY REMOVAL N/A 07/15/2016   Procedure: FOREIGN BODY REMOVAL;  Surgeon: Rogelia Copping, MD;  Location: [facility] ENDOSCOPY;  Service: Endoscopy;  Laterality: N/A;   KNEE ARTHROSCOPY Right 12/09/2018   Procedure: ARTHROSCOPY KNEE WITH PARTIAL LATERAL MENISCECTOMY;  Surgeon: Marchia Drivers, MD;  Location: [facility] ORS;  Service: Orthopedics;  Laterality: Right;   TOTAL ABDOMINAL HYSTERECTOMY      Family History  Problem Relation Age of Onset   Alcohol abuse Mother    Heart disease Father 19   Hypertension Father    Hypertension Brother    Hyperlipidemia Brother    Heart disease Brother 31   Asthma Maternal Grandmother    Stroke Maternal Grandfather    Hypertension Maternal Grandfather    Congestive Heart Failure Paternal  Grandmother    Heart disease Paternal Grandfather    Congestive Heart Failure Paternal Grandfather     Social History   Tobacco Use   Smoking status: Every Day    Current packs/day: 1.00    Average packs/day: 1 pack/day for 36.8 years (36.8 ttl pk-yrs)    Types: Cigarettes    Start date: 03/09/1988    Passive  exposure: Current   Smokeless tobacco: Never   Tobacco comments:    0.5PPD 11/23/2023  Substance Use Topics   Alcohol use: No    Current Medications[1]  Allergies[2]  I personally reviewed active problem list, medication list, allergies, family history with the patient/caregiver today.   ROS  Ten systems reviewed and is negative except as mentioned in HPI    Objective Physical Exam VITALS: BP- 136/80 MEASUREMENTS: Weight- 267, BMI- 40.0. CONSTITUTIONAL: Patient appears well-developed and well-nourished.  No distress. HEENT: Head atraumatic, normocephalic, neck supple. CARDIOVASCULAR: Normal rate, regular rhythm and normal heart sounds.  No murmur heard. No BLE edema. PULMONARY: Effort normal and breath sounds normal. No respiratory distress. ABDOMINAL: There is no tenderness or distention. MUSCULOSKELETAL: Normal gait. Without gross motor or sensory deficit. PSYCHIATRIC: Patient has a normal mood and affect. behavior is normal. Judgment and thought content normal.  Vitals:   12/16/24 1030  BP: 136/80  Pulse: 83  Resp: 16  SpO2: 96%  Weight: 267 lb 12.8 oz (121.5 kg)  Height: 5' 8 (1.727 m)    Body mass index is 40.72 kg/m.  Recent Results (from the past 2160 hours)  Hepatitis B Surface AntiBODY     Status: None   Collection Time: 10/05/24  2:05 PM  Result Value Ref Range   Hep B S Ab NON-REACTIVE NON-REACTIVE  Hemoglobin A1c     Status: None   Collection Time: 10/05/24  2:05 PM  Result Value Ref Range   Hgb A1c MFr Bld 5.4 <5.7 %    Comment: For the purpose of screening for the presence of diabetes: . <5.7%       Consistent with the absence of diabetes 5.7-6.4%    Consistent with increased risk for diabetes             (prediabetes) > or =6.5%  Consistent with diabetes . This assay result is consistent with a decreased risk of diabetes. . Currently, no consensus exists regarding use of hemoglobin A1c for diagnosis of diabetes in  children. . According to American Diabetes Association (ADA) guidelines, hemoglobin A1c <7.0% represents optimal control in non-pregnant diabetic patients. Different metrics may apply to specific patient populations.  Standards of Medical Care in Diabetes(ADA). .    Mean Plasma Glucose 108 mg/dL   eAG (mmol/L) 6.0 mmol/L  Lipid panel     Status: Abnormal   Collection Time: 10/05/24  2:05 PM  Result Value Ref Range   Cholesterol 200 (H) <200 mg/dL   HDL 48 (L) > OR = 50 mg/dL   Triglycerides 845 (H) <150 mg/dL   LDL Cholesterol (Calc) 125 (H) mg/dL (calc)    Comment: Reference range: <100 . Desirable range <100 mg/dL for primary prevention;   <70 mg/dL for patients with CHD or diabetic patients  with > or = 2 CHD risk factors. SABRA LDL-C is now calculated using the Martin-Hopkins  calculation, which is a validated novel method providing  better accuracy than the Friedewald equation in the  estimation of LDL-C.  Gladis APPLETHWAITE et al. SANDREA. 7986;689(80): 2061-2068  (http://education.[facility].com/faq/FAQ164)    Total CHOL/HDL Ratio  4.2 <5.0 (calc)   Non-HDL Cholesterol (Calc) 152 (H) <130 mg/dL (calc)    Comment: For patients with diabetes plus 1 major ASCVD risk  factor, treating to a non-HDL-C goal of <100 mg/dL  (LDL-C of <29 mg/dL) is considered a therapeutic  option.   Comprehensive metabolic panel with GFR     Status: Abnormal   Collection Time: 10/05/24  2:05 PM  Result Value Ref Range   Glucose, Bld 86 65 - 99 mg/dL    Comment: .            Fasting reference interval .    BUN 17 7 - 25 mg/dL   Creat 8.81 (H) 9.49 - 1.03 mg/dL   eGFR 54 (L) > OR = 60 mL/min/1.55m2   BUN/Creatinine Ratio 14 6 - 22 (calc)   Sodium 142 135 - 146 mmol/L   Potassium 4.5 3.5 - 5.3 mmol/L   Chloride 108 98 - 110 mmol/L   CO2 25 20 - 32 mmol/L   Calcium  9.5 8.6 - 10.4 mg/dL   Total Protein 6.7 6.1 - 8.1 g/dL   Albumin 4.0 3.6 - 5.1 g/dL   Globulin 2.7 1.9 - 3.7 g/dL (calc)   AG  Ratio 1.5 1.0 - 2.5 (calc)   Total Bilirubin 0.3 0.2 - 1.2 mg/dL   Alkaline phosphatase (APISO) 64 37 - 153 U/L   AST 13 10 - 35 U/L   ALT 14 6 - 29 U/L  CBC with Differential/Platelet     Status: Abnormal   Collection Time: 10/05/24  2:05 PM  Result Value Ref Range   WBC 7.0 3.8 - 10.8 Thousand/uL   RBC 4.77 3.80 - 5.10 Million/uL   Hemoglobin 15.2 11.7 - 15.5 g/dL   HCT 54.6 (H) 64.9 - 54.9 %   MCV 95.0 80.0 - 100.0 fL   MCH 31.9 27.0 - 33.0 pg   MCHC 33.6 32.0 - 36.0 g/dL    Comment: For adults, a slight decrease in the calculated MCHC value (in the range of 30 to 32 g/dL) is most likely not clinically significant; however, it should be interpreted with caution in correlation with other red cell parameters and the patient's clinical condition.    RDW 12.8 11.0 - 15.0 %   Platelets 341 140 - 400 Thousand/uL   MPV 10.4 7.5 - 12.5 fL   Neutro Abs 3,101 1,500 - 7,800 cells/uL   Absolute Lymphocytes 2,919 850 - 3,900 cells/uL   Absolute Monocytes 462 200 - 950 cells/uL   Eosinophils Absolute 476 15 - 500 cells/uL   Basophils Absolute 42 0 - 200 cells/uL   Neutrophils Relative % 44.3 %   Total Lymphocyte 41.7 %   Monocytes Relative 6.6 %   Eosinophils Relative 6.8 %   Basophils Relative 0.6 %  VITAMIN D  25 Hydroxy (Vit-D Deficiency, Fractures)     Status: Abnormal   Collection Time: 10/05/24  2:05 PM  Result Value Ref Range   Vit D, 25-Hydroxy 18 (L) 30 - 100 ng/mL    Comment: Vitamin D  Status         25-OH Vitamin D : . Deficiency:                    <20 ng/mL Insufficiency:             20 - 29 ng/mL Optimal:                 > or = 30 ng/mL . For 25-OH Vitamin  D testing on patients on  D2-supplementation and patients for whom quantitation  of D2 and D3 fractions is required, the [facility](TM) 25-OH VIT D, (D2,D3), LC/MS/MS is recommended: order  code 07111 (patients >36yrs). . See Note 1 . Note 1 . For additional information, please refer to   http://education.[facility].com/faq/FAQ199  (This link is being provided for informational/ educational purposes only.)   B12 and Folate Panel     Status: None   Collection Time: 10/05/24  2:05 PM  Result Value Ref Range   Vitamin B-12 504 200 - 1,100 pg/mL   Folate 8.3 ng/mL    Comment:                            Reference Range                            Low:           <3.4                            Borderline:    3.4-5.4                            Normal:        >5.4 .       PHQ2/9:    12/16/2024   10:30 AM 10/13/2024   12:51 PM 10/02/2024    2:48 PM 07/17/2023    8:09 AM 04/19/2023    9:35 AM  Depression screen PHQ 2/9  Decreased Interest 0 1 1 0 0  Down, Depressed, Hopeless 0 1 1 0 0  PHQ - 2 Score 0 2 2 0 0  Altered sleeping  1 1 0 0  Tired, decreased energy  1 1 0 0  Change in appetite  0 0 0 0  Feeling bad or failure about yourself   0 0 0 0  Trouble concentrating  0 0 0 0  Moving slowly or fidgety/restless  0 0 0 0  Suicidal thoughts  0 0 0 0  PHQ-9 Score  4 4 0  0   Difficult doing work/chores  Somewhat difficult Somewhat difficult Not difficult at all Not difficult at all     Data saved with a previous flowsheet row definition    phq 9 is negative  Fall Risk:    12/16/2024   10:29 AM 10/13/2024   12:50 PM 10/02/2024    2:48 PM 07/17/2023    8:09 AM 04/19/2023    9:35 AM  Fall Risk   Falls in the past year? 0 0 0 0 0  Number falls in past yr: 0 0 0 0 0  Injury with Fall? 0 0  0  0  0   Risk for fall due to : No Fall Risks No Fall Risks No Fall Risks No Fall Risks   Follow up Falls evaluation completed Falls evaluation completed Falls evaluation completed Falls prevention discussed;Education provided;Falls evaluation completed      Data saved with a previous flowsheet row definition      Assessment & Plan Morbid obesity BMI over 40. Previous Wegovy  trial ineffective. Zepbound  considered more potent. Weight loss expected to improve COPD and  overall health. - Initiated Zepbound  at 2.5 mg, prescription sent to pharmacy. - Consider Wegovy  or [employer] program if Zepbound  not covered. -  Advised dietary modifications: smaller portions, protein first, avoid junk food, use protein shakes. - Encouraged daily physical activity, at least a mile of walking. - Scheduled follow-up in three months for weight loss progress and medication tolerance.  Chronic obstructive pulmonary disease COPD with shortness of breath, exacerbated by obesity. Weight loss anticipated to improve symptoms. - Continue current inhaler regimen and steroid therapy. - Encouraged weight loss to improve respiratory function.  Dyslipidemia Elevated LDL, low HDL, slightly elevated triglycerides. On rosuvastatin . Weight loss may improve lipid profile. - Continue rosuvastatin  therapy. - Encouraged weight loss to potentially improve lipid profile.  Vitamin D  deficiency Low vitamin D  levels noted. - Initiated vitamin D  supplementation.        [1]  Current Outpatient Medications:    albuterol  (VENTOLIN  HFA) 108 (90 Base) MCG/ACT inhaler, INHALE 2 PUFFS BY MOUTH EVERY 6 HOURS AS NEEDED FOR WHEEZING FOR SHORTNESS OF BREATH, Disp: 9 g, Rfl: 0   atenolol  (TENORMIN ) 25 MG tablet, Take 1 tablet (25 mg total) by mouth every evening., Disp: 90 tablet, Rfl: 0   azelastine  (ASTELIN ) 0.1 % nasal spray, Place 2 sprays into both nostrils 2 (two) times daily. Use in each nostril as directed, Disp: 30 mL, Rfl: 2   Cyanocobalamin  (VITAMIN B-12 PO), Take by mouth daily., Disp: , Rfl:    fluticasone  (FLONASE ) 50 MCG/ACT nasal spray, Place 2 sprays into both nostrils daily., Disp: 16 g, Rfl: 0   hydrochlorothiazide  (HYDRODIURIL ) 12.5 MG tablet, Take 1 tablet (12.5 mg total) by mouth every morning., Disp: 90 tablet, Rfl: 0   loratadine (CLARITIN) 10 MG tablet, Take 10 mg by mouth daily., Disp: , Rfl:    meclizine  (ANTIVERT ) 25 MG tablet, Take 0.5-1 tablets (12.5-25 mg total) by mouth 2  (two) times daily as needed for dizziness., Disp: 100 tablet, Rfl: 0   Multiple Vitamin (MULTIVITAMIN) capsule, Take 1 capsule by mouth daily., Disp: , Rfl:    rosuvastatin  (CRESTOR ) 10 MG tablet, Take 1 tablet (10 mg total) by mouth daily., Disp: 90 tablet, Rfl: 1   SYMBICORT  160-4.5 MCG/ACT inhaler, Inhale 2 puffs into the lungs 2 (two) times daily., Disp: 10.2 g, Rfl: 6   tolterodine  (DETROL  LA) 2 MG 24 hr capsule, Take 1 capsule (2 mg total) by mouth daily., Disp: 90 capsule, Rfl: 0   VITAMIN D  PO, Take by mouth daily., Disp: , Rfl:    Vitamin D , Ergocalciferol , (DRISDOL ) 1.25 MG (50000 UNIT) CAPS capsule, Take 1 capsule (50,000 Units total) by mouth every 7 (seven) days., Disp: 12 capsule, Rfl: 1   Zinc  50 MG TABS, Take by mouth., Disp: , Rfl:  [2] No Known Allergies

## 2024-12-17 ENCOUNTER — Other Ambulatory Visit (HOSPITAL_COMMUNITY): Payer: Self-pay

## 2024-12-18 ENCOUNTER — Other Ambulatory Visit (HOSPITAL_COMMUNITY): Payer: Self-pay

## 2024-12-22 ENCOUNTER — Telehealth: Payer: Self-pay | Admitting: Pharmacy Technician

## 2024-12-22 ENCOUNTER — Other Ambulatory Visit (HOSPITAL_COMMUNITY): Payer: Self-pay

## 2024-12-22 NOTE — Telephone Encounter (Signed)
 Pharmacy Patient Advocate Encounter  Received notification from [employer] that Prior Authorization for Zepbound  2.5MG /0.5ML pen-  has been APPROVED from 12/22/2024 to 06/20/2025. Ran test claim, Copay is $4.00. This test claim was processed through [facility]- copay amounts may vary at other pharmacies due to pharmacy/plan contracts, or as the patient moves through the different stages of their insurance plan.   PA #/Case ID/Reference #: [id number]

## 2024-12-22 NOTE — Telephone Encounter (Signed)
 Pharmacy Patient Advocate Encounter   Received notification from Fax that prior authorization for Zepbound  2.5MG /0.5ML pen-injectors  is required/requested.   Insurance verification completed.   The patient is insured through [employer].   Per test claim: PA required; PA submitted to above mentioned insurance via Latent Key/confirmation #/EOC [id number] Status is pending

## 2024-12-23 NOTE — Telephone Encounter (Signed)
" ° °  Copied from CRM [id number]. Topic: Clinical - Medication Prior Auth >> Dec 23, 2024 12:20 PM Larissa RAMAN wrote: Reason for CRM: Patient calling to check the status of PA for Zepbound . Requesting to have prescription sent to:   [facility] [address] ([address]), [place] - [address]) [place] [address] Phone: [phone] Fax: [phone] Hours: Not open 24 hours "

## 2024-12-23 NOTE — Telephone Encounter (Signed)
 No answer but left vm stating it was approved and to ask pharmacy to re run rx.  Pharmacy Patient Advocate Encounter   Received notification from [employer] that Prior Authorization for Zepbound  2.5MG /0.5ML pen-  has been APPROVED from 12/22/2024 to 06/20/2025. Ran test claim, Copay is $4.00. This test claim was processed through [facility]- copay amounts may vary at other pharmacies due to pharmacy/plan contracts, or as the patient moves through the different stages of their insurance plan.    PA #/Case ID/Reference #: [id number]

## 2024-12-31 ENCOUNTER — Ambulatory Visit: Admitting: Pulmonary Disease

## 2025-01-13 ENCOUNTER — Ambulatory Visit: Admitting: Family Medicine

## 2025-03-15 ENCOUNTER — Ambulatory Visit: Admitting: Family Medicine
